# Patient Record
Sex: Female | Born: 1975
Health system: Southern US, Community
[De-identification: ages and names within clinical notes are randomized; demographics above are authoritative.]

## PROBLEM LIST (undated history)

## (undated) ENCOUNTER — Inpatient Hospital Stay (HOSPITAL_COMMUNITY): Payer: Self-pay

## (undated) DIAGNOSIS — Z8489 Family history of other specified conditions: Secondary | ICD-10-CM

## (undated) DIAGNOSIS — F329 Major depressive disorder, single episode, unspecified: Secondary | ICD-10-CM

## (undated) DIAGNOSIS — N189 Chronic kidney disease, unspecified: Secondary | ICD-10-CM

## (undated) DIAGNOSIS — J189 Pneumonia, unspecified organism: Secondary | ICD-10-CM

## (undated) DIAGNOSIS — I499 Cardiac arrhythmia, unspecified: Secondary | ICD-10-CM

## (undated) DIAGNOSIS — R112 Nausea with vomiting, unspecified: Secondary | ICD-10-CM

## (undated) DIAGNOSIS — I209 Angina pectoris, unspecified: Secondary | ICD-10-CM

## (undated) DIAGNOSIS — I10 Essential (primary) hypertension: Secondary | ICD-10-CM

## (undated) DIAGNOSIS — R569 Unspecified convulsions: Secondary | ICD-10-CM

## (undated) DIAGNOSIS — K439 Ventral hernia without obstruction or gangrene: Principal | ICD-10-CM

## (undated) DIAGNOSIS — F319 Bipolar disorder, unspecified: Secondary | ICD-10-CM

## (undated) DIAGNOSIS — C801 Malignant (primary) neoplasm, unspecified: Secondary | ICD-10-CM

## (undated) DIAGNOSIS — R0602 Shortness of breath: Secondary | ICD-10-CM

## (undated) DIAGNOSIS — E039 Hypothyroidism, unspecified: Secondary | ICD-10-CM

## (undated) DIAGNOSIS — M1990 Unspecified osteoarthritis, unspecified site: Secondary | ICD-10-CM

## (undated) DIAGNOSIS — K219 Gastro-esophageal reflux disease without esophagitis: Secondary | ICD-10-CM

## (undated) DIAGNOSIS — E119 Type 2 diabetes mellitus without complications: Secondary | ICD-10-CM

## (undated) DIAGNOSIS — D649 Anemia, unspecified: Secondary | ICD-10-CM

## (undated) DIAGNOSIS — K760 Fatty (change of) liver, not elsewhere classified: Secondary | ICD-10-CM

## (undated) DIAGNOSIS — F419 Anxiety disorder, unspecified: Secondary | ICD-10-CM

## (undated) DIAGNOSIS — L409 Psoriasis, unspecified: Secondary | ICD-10-CM

## (undated) DIAGNOSIS — G4730 Sleep apnea, unspecified: Secondary | ICD-10-CM

## (undated) DIAGNOSIS — E7800 Pure hypercholesterolemia, unspecified: Secondary | ICD-10-CM

## (undated) DIAGNOSIS — Z9889 Other specified postprocedural states: Secondary | ICD-10-CM

## (undated) DIAGNOSIS — I639 Cerebral infarction, unspecified: Secondary | ICD-10-CM

## (undated) DIAGNOSIS — F32A Depression, unspecified: Secondary | ICD-10-CM

## (undated) DIAGNOSIS — Z9289 Personal history of other medical treatment: Secondary | ICD-10-CM

## (undated) DIAGNOSIS — Z6841 Body Mass Index (BMI) 40.0 and over, adult: Secondary | ICD-10-CM

## (undated) DIAGNOSIS — F99 Mental disorder, not otherwise specified: Secondary | ICD-10-CM

## (undated) HISTORY — PX: NEUROPLASTY / TRANSPOSITION MEDIAN NERVE AT CARPAL TUNNEL BILATERAL: SUR894

## (undated) HISTORY — DX: Cerebral infarction, unspecified: I63.9

## (undated) HISTORY — PX: OTHER SURGICAL HISTORY: SHX169

## (undated) HISTORY — PX: KIDNEY STONE SURGERY: SHX686

## (undated) HISTORY — DX: Type 2 diabetes mellitus without complications: E11.9

## (undated) HISTORY — PX: APPENDECTOMY: SHX54

## (undated) HISTORY — DX: Ventral hernia without obstruction or gangrene: K43.9

## (undated) HISTORY — DX: Morbid (severe) obesity due to excess calories: E66.01

## (undated) HISTORY — PX: HERNIA REPAIR: SHX51

## (undated) HISTORY — PX: DILATION AND CURETTAGE OF UTERUS: SHX78

## (undated) HISTORY — DX: Cardiac arrhythmia, unspecified: I49.9

## (undated) HISTORY — DX: Body Mass Index (BMI) 40.0 and over, adult: Z684

## (undated) HISTORY — DX: Malignant (primary) neoplasm, unspecified: C80.1

---

## 1997-06-27 ENCOUNTER — Emergency Department (HOSPITAL_COMMUNITY): Admission: EM | Admit: 1997-06-27 | Discharge: 1997-06-27 | Payer: Self-pay

## 1997-11-30 ENCOUNTER — Ambulatory Visit (HOSPITAL_COMMUNITY): Admission: RE | Admit: 1997-11-30 | Discharge: 1997-11-30 | Payer: Self-pay | Admitting: Obstetrics and Gynecology

## 1997-12-02 ENCOUNTER — Ambulatory Visit (HOSPITAL_COMMUNITY): Admission: RE | Admit: 1997-12-02 | Discharge: 1997-12-02 | Payer: Self-pay | Admitting: *Deleted

## 1997-12-06 ENCOUNTER — Ambulatory Visit (HOSPITAL_COMMUNITY): Admission: RE | Admit: 1997-12-06 | Discharge: 1997-12-06 | Payer: Self-pay | Admitting: *Deleted

## 1998-01-14 ENCOUNTER — Encounter: Payer: Self-pay | Admitting: Obstetrics and Gynecology

## 1998-01-14 ENCOUNTER — Inpatient Hospital Stay (HOSPITAL_COMMUNITY): Admission: AD | Admit: 1998-01-14 | Discharge: 1998-01-14 | Payer: Self-pay | Admitting: Obstetrics and Gynecology

## 1998-01-16 ENCOUNTER — Ambulatory Visit (HOSPITAL_COMMUNITY): Admission: RE | Admit: 1998-01-16 | Discharge: 1998-01-16 | Payer: Self-pay | Admitting: Obstetrics and Gynecology

## 1998-01-18 ENCOUNTER — Observation Stay (HOSPITAL_COMMUNITY): Admission: RE | Admit: 1998-01-18 | Discharge: 1998-01-19 | Payer: Self-pay | Admitting: *Deleted

## 1998-01-23 ENCOUNTER — Inpatient Hospital Stay (HOSPITAL_COMMUNITY): Admission: AD | Admit: 1998-01-23 | Discharge: 1998-01-23 | Payer: Self-pay | Admitting: Obstetrics & Gynecology

## 1998-04-15 ENCOUNTER — Ambulatory Visit (HOSPITAL_COMMUNITY): Admission: RE | Admit: 1998-04-15 | Discharge: 1998-04-15 | Payer: Self-pay | Admitting: Family Medicine

## 1998-04-15 ENCOUNTER — Encounter: Payer: Self-pay | Admitting: Family Medicine

## 1998-05-03 ENCOUNTER — Emergency Department (HOSPITAL_COMMUNITY): Admission: EM | Admit: 1998-05-03 | Discharge: 1998-05-03 | Payer: Self-pay | Admitting: Emergency Medicine

## 1998-05-03 ENCOUNTER — Encounter: Payer: Self-pay | Admitting: Emergency Medicine

## 1998-09-09 ENCOUNTER — Other Ambulatory Visit: Admission: RE | Admit: 1998-09-09 | Discharge: 1998-09-09 | Payer: Self-pay | Admitting: *Deleted

## 1998-09-09 ENCOUNTER — Encounter (INDEPENDENT_AMBULATORY_CARE_PROVIDER_SITE_OTHER): Payer: Self-pay | Admitting: Specialist

## 1998-10-02 ENCOUNTER — Inpatient Hospital Stay (HOSPITAL_COMMUNITY): Admission: EM | Admit: 1998-10-02 | Discharge: 1998-10-06 | Payer: Self-pay | Admitting: Cardiology

## 1998-10-07 ENCOUNTER — Other Ambulatory Visit: Admission: RE | Admit: 1998-10-07 | Discharge: 1998-10-14 | Payer: Self-pay

## 1998-10-21 ENCOUNTER — Encounter (INDEPENDENT_AMBULATORY_CARE_PROVIDER_SITE_OTHER): Payer: Self-pay

## 1998-10-21 ENCOUNTER — Ambulatory Visit (HOSPITAL_COMMUNITY): Admission: RE | Admit: 1998-10-21 | Discharge: 1998-10-21 | Payer: Self-pay | Admitting: Obstetrics and Gynecology

## 1998-12-18 ENCOUNTER — Emergency Department (HOSPITAL_COMMUNITY): Admission: EM | Admit: 1998-12-18 | Discharge: 1998-12-18 | Payer: Self-pay | Admitting: Emergency Medicine

## 1999-03-08 ENCOUNTER — Emergency Department (HOSPITAL_COMMUNITY): Admission: EM | Admit: 1999-03-08 | Discharge: 1999-03-08 | Payer: Self-pay | Admitting: Emergency Medicine

## 1999-07-28 ENCOUNTER — Emergency Department (HOSPITAL_COMMUNITY): Admission: EM | Admit: 1999-07-28 | Discharge: 1999-07-28 | Payer: Self-pay | Admitting: Emergency Medicine

## 1999-07-28 ENCOUNTER — Encounter: Payer: Self-pay | Admitting: Emergency Medicine

## 1999-10-13 ENCOUNTER — Emergency Department (HOSPITAL_COMMUNITY): Admission: EM | Admit: 1999-10-13 | Discharge: 1999-10-13 | Payer: Self-pay | Admitting: Emergency Medicine

## 2001-08-15 ENCOUNTER — Other Ambulatory Visit: Admission: RE | Admit: 2001-08-15 | Discharge: 2001-08-15 | Payer: Self-pay | Admitting: *Deleted

## 2001-09-12 ENCOUNTER — Ambulatory Visit (HOSPITAL_BASED_OUTPATIENT_CLINIC_OR_DEPARTMENT_OTHER): Admission: RE | Admit: 2001-09-12 | Discharge: 2001-09-12 | Payer: Self-pay | Admitting: Neurology

## 2001-09-26 ENCOUNTER — Ambulatory Visit (HOSPITAL_COMMUNITY): Admission: AD | Admit: 2001-09-26 | Discharge: 2001-09-26 | Payer: Self-pay | Admitting: *Deleted

## 2001-09-26 ENCOUNTER — Encounter (INDEPENDENT_AMBULATORY_CARE_PROVIDER_SITE_OTHER): Payer: Self-pay

## 2001-10-21 ENCOUNTER — Inpatient Hospital Stay (HOSPITAL_COMMUNITY): Admission: AD | Admit: 2001-10-21 | Discharge: 2001-10-23 | Payer: Self-pay | Admitting: *Deleted

## 2001-10-21 ENCOUNTER — Encounter: Payer: Self-pay | Admitting: Emergency Medicine

## 2001-10-22 ENCOUNTER — Encounter: Payer: Self-pay | Admitting: *Deleted

## 2001-12-16 ENCOUNTER — Ambulatory Visit (HOSPITAL_COMMUNITY): Admission: RE | Admit: 2001-12-16 | Discharge: 2001-12-16 | Payer: Self-pay | Admitting: Endocrinology

## 2001-12-16 ENCOUNTER — Encounter: Payer: Self-pay | Admitting: Endocrinology

## 2001-12-22 ENCOUNTER — Ambulatory Visit (HOSPITAL_BASED_OUTPATIENT_CLINIC_OR_DEPARTMENT_OTHER): Admission: RE | Admit: 2001-12-22 | Discharge: 2001-12-22 | Payer: Self-pay | Admitting: Specialist

## 2001-12-22 ENCOUNTER — Encounter (INDEPENDENT_AMBULATORY_CARE_PROVIDER_SITE_OTHER): Payer: Self-pay | Admitting: *Deleted

## 2002-01-19 ENCOUNTER — Emergency Department (HOSPITAL_COMMUNITY): Admission: EM | Admit: 2002-01-19 | Discharge: 2002-01-20 | Payer: Self-pay | Admitting: Emergency Medicine

## 2002-01-19 ENCOUNTER — Encounter: Payer: Self-pay | Admitting: Emergency Medicine

## 2002-01-28 ENCOUNTER — Ambulatory Visit (HOSPITAL_BASED_OUTPATIENT_CLINIC_OR_DEPARTMENT_OTHER): Admission: RE | Admit: 2002-01-28 | Discharge: 2002-01-28 | Payer: Self-pay | Admitting: Neurology

## 2002-09-03 ENCOUNTER — Encounter: Admission: RE | Admit: 2002-09-03 | Discharge: 2002-12-02 | Payer: Self-pay | Admitting: Orthopedic Surgery

## 2002-10-06 ENCOUNTER — Emergency Department (HOSPITAL_COMMUNITY): Admission: EM | Admit: 2002-10-06 | Discharge: 2002-10-06 | Payer: Self-pay | Admitting: Emergency Medicine

## 2002-10-22 ENCOUNTER — Other Ambulatory Visit: Admission: RE | Admit: 2002-10-22 | Discharge: 2002-10-22 | Payer: Self-pay | Admitting: *Deleted

## 2003-06-03 ENCOUNTER — Emergency Department (HOSPITAL_COMMUNITY): Admission: EM | Admit: 2003-06-03 | Discharge: 2003-06-03 | Payer: Self-pay | Admitting: Family Medicine

## 2003-06-14 ENCOUNTER — Inpatient Hospital Stay (HOSPITAL_COMMUNITY): Admission: AD | Admit: 2003-06-14 | Discharge: 2003-06-14 | Payer: Self-pay | Admitting: Obstetrics & Gynecology

## 2003-06-20 ENCOUNTER — Emergency Department (HOSPITAL_COMMUNITY): Admission: EM | Admit: 2003-06-20 | Discharge: 2003-06-20 | Payer: Self-pay | Admitting: Family Medicine

## 2003-07-21 ENCOUNTER — Emergency Department (HOSPITAL_COMMUNITY): Admission: EM | Admit: 2003-07-21 | Discharge: 2003-07-21 | Payer: Self-pay | Admitting: Family Medicine

## 2003-08-16 ENCOUNTER — Emergency Department (HOSPITAL_COMMUNITY): Admission: EM | Admit: 2003-08-16 | Discharge: 2003-08-16 | Payer: Self-pay | Admitting: Family Medicine

## 2003-08-23 ENCOUNTER — Emergency Department (HOSPITAL_COMMUNITY): Admission: EM | Admit: 2003-08-23 | Discharge: 2003-08-23 | Payer: Self-pay | Admitting: Family Medicine

## 2003-11-03 ENCOUNTER — Emergency Department (HOSPITAL_COMMUNITY): Admission: EM | Admit: 2003-11-03 | Discharge: 2003-11-03 | Payer: Self-pay | Admitting: Family Medicine

## 2003-12-06 ENCOUNTER — Encounter (INDEPENDENT_AMBULATORY_CARE_PROVIDER_SITE_OTHER): Payer: Self-pay | Admitting: *Deleted

## 2003-12-06 ENCOUNTER — Ambulatory Visit (HOSPITAL_COMMUNITY): Admission: RE | Admit: 2003-12-06 | Discharge: 2003-12-06 | Payer: Self-pay | Admitting: *Deleted

## 2003-12-21 ENCOUNTER — Encounter: Admission: RE | Admit: 2003-12-21 | Discharge: 2003-12-21 | Payer: Self-pay | Admitting: General Surgery

## 2004-01-07 ENCOUNTER — Emergency Department (HOSPITAL_COMMUNITY): Admission: EM | Admit: 2004-01-07 | Discharge: 2004-01-07 | Payer: Self-pay | Admitting: Family Medicine

## 2004-03-08 ENCOUNTER — Inpatient Hospital Stay (HOSPITAL_COMMUNITY): Admission: RE | Admit: 2004-03-08 | Discharge: 2004-03-13 | Payer: Self-pay | Admitting: Psychiatry

## 2004-03-08 ENCOUNTER — Ambulatory Visit: Payer: Self-pay | Admitting: Psychiatry

## 2004-03-14 ENCOUNTER — Other Ambulatory Visit (HOSPITAL_COMMUNITY): Admission: RE | Admit: 2004-03-14 | Discharge: 2004-06-12 | Payer: Self-pay | Admitting: Psychiatry

## 2004-04-12 ENCOUNTER — Ambulatory Visit: Payer: Self-pay | Admitting: Licensed Clinical Social Worker

## 2004-04-19 ENCOUNTER — Ambulatory Visit: Payer: Self-pay | Admitting: Licensed Clinical Social Worker

## 2004-05-03 ENCOUNTER — Ambulatory Visit: Payer: Self-pay | Admitting: Licensed Clinical Social Worker

## 2004-08-15 ENCOUNTER — Ambulatory Visit (HOSPITAL_COMMUNITY): Admission: RE | Admit: 2004-08-15 | Discharge: 2004-08-15 | Payer: Self-pay | Admitting: Obstetrics & Gynecology

## 2004-09-18 ENCOUNTER — Ambulatory Visit (HOSPITAL_COMMUNITY): Admission: RE | Admit: 2004-09-18 | Discharge: 2004-09-18 | Payer: Self-pay | Admitting: Obstetrics

## 2004-10-25 ENCOUNTER — Ambulatory Visit (HOSPITAL_COMMUNITY): Admission: RE | Admit: 2004-10-25 | Discharge: 2004-10-25 | Payer: Self-pay | Admitting: Obstetrics & Gynecology

## 2004-12-25 ENCOUNTER — Inpatient Hospital Stay (HOSPITAL_COMMUNITY): Admission: AD | Admit: 2004-12-25 | Discharge: 2004-12-28 | Payer: Self-pay | Admitting: Obstetrics

## 2005-01-22 DIAGNOSIS — I639 Cerebral infarction, unspecified: Secondary | ICD-10-CM

## 2005-01-22 HISTORY — DX: Cerebral infarction, unspecified: I63.9

## 2005-02-15 ENCOUNTER — Ambulatory Visit (HOSPITAL_COMMUNITY): Admission: RE | Admit: 2005-02-15 | Discharge: 2005-02-15 | Payer: Self-pay | Admitting: Obstetrics & Gynecology

## 2005-02-26 ENCOUNTER — Ambulatory Visit (HOSPITAL_COMMUNITY): Admission: RE | Admit: 2005-02-26 | Discharge: 2005-02-26 | Payer: Self-pay | Admitting: Obstetrics & Gynecology

## 2005-04-04 ENCOUNTER — Inpatient Hospital Stay (HOSPITAL_COMMUNITY): Admission: AD | Admit: 2005-04-04 | Discharge: 2005-04-06 | Payer: Self-pay | Admitting: Obstetrics

## 2005-06-05 ENCOUNTER — Emergency Department (HOSPITAL_COMMUNITY): Admission: EM | Admit: 2005-06-05 | Discharge: 2005-06-05 | Payer: Self-pay | Admitting: Emergency Medicine

## 2005-10-16 ENCOUNTER — Ambulatory Visit (HOSPITAL_COMMUNITY): Admission: RE | Admit: 2005-10-16 | Discharge: 2005-10-16 | Payer: Self-pay | Admitting: *Deleted

## 2005-10-16 ENCOUNTER — Encounter (INDEPENDENT_AMBULATORY_CARE_PROVIDER_SITE_OTHER): Payer: Self-pay | Admitting: *Deleted

## 2006-02-21 ENCOUNTER — Ambulatory Visit (HOSPITAL_BASED_OUTPATIENT_CLINIC_OR_DEPARTMENT_OTHER): Admission: RE | Admit: 2006-02-21 | Discharge: 2006-02-21 | Payer: Self-pay | Admitting: Neurology

## 2006-02-24 ENCOUNTER — Ambulatory Visit: Payer: Self-pay | Admitting: Internal Medicine

## 2006-11-03 ENCOUNTER — Emergency Department (HOSPITAL_COMMUNITY): Admission: EM | Admit: 2006-11-03 | Discharge: 2006-11-03 | Payer: Self-pay | Admitting: Family Medicine

## 2006-12-02 ENCOUNTER — Emergency Department (HOSPITAL_COMMUNITY): Admission: EM | Admit: 2006-12-02 | Discharge: 2006-12-03 | Payer: Self-pay | Admitting: Emergency Medicine

## 2006-12-04 ENCOUNTER — Inpatient Hospital Stay (HOSPITAL_COMMUNITY): Admission: EM | Admit: 2006-12-04 | Discharge: 2006-12-09 | Payer: Self-pay | Admitting: Emergency Medicine

## 2007-04-25 ENCOUNTER — Ambulatory Visit (HOSPITAL_COMMUNITY): Admission: RE | Admit: 2007-04-25 | Discharge: 2007-04-25 | Payer: Self-pay | Admitting: Obstetrics & Gynecology

## 2007-05-23 ENCOUNTER — Emergency Department (HOSPITAL_COMMUNITY): Admission: EM | Admit: 2007-05-23 | Discharge: 2007-05-24 | Payer: Self-pay | Admitting: Emergency Medicine

## 2007-05-25 ENCOUNTER — Emergency Department (HOSPITAL_COMMUNITY): Admission: EM | Admit: 2007-05-25 | Discharge: 2007-05-25 | Payer: Self-pay | Admitting: Family Medicine

## 2007-07-21 ENCOUNTER — Encounter: Payer: Self-pay | Admitting: Obstetrics & Gynecology

## 2007-07-21 ENCOUNTER — Ambulatory Visit (HOSPITAL_COMMUNITY): Admission: RE | Admit: 2007-07-21 | Discharge: 2007-07-21 | Payer: Self-pay | Admitting: Obstetrics & Gynecology

## 2007-10-08 ENCOUNTER — Encounter: Admission: RE | Admit: 2007-10-08 | Discharge: 2007-11-11 | Payer: Self-pay | Admitting: Internal Medicine

## 2007-11-06 ENCOUNTER — Emergency Department (HOSPITAL_BASED_OUTPATIENT_CLINIC_OR_DEPARTMENT_OTHER): Admission: EM | Admit: 2007-11-06 | Discharge: 2007-11-06 | Payer: Self-pay | Admitting: Emergency Medicine

## 2007-11-07 ENCOUNTER — Emergency Department (HOSPITAL_BASED_OUTPATIENT_CLINIC_OR_DEPARTMENT_OTHER): Admission: EM | Admit: 2007-11-07 | Discharge: 2007-11-07 | Payer: Self-pay | Admitting: Emergency Medicine

## 2008-07-16 ENCOUNTER — Emergency Department (HOSPITAL_COMMUNITY): Admission: EM | Admit: 2008-07-16 | Discharge: 2008-07-16 | Payer: Self-pay | Admitting: Emergency Medicine

## 2008-11-22 ENCOUNTER — Ambulatory Visit (HOSPITAL_COMMUNITY): Admission: RE | Admit: 2008-11-22 | Discharge: 2008-11-22 | Payer: Self-pay | Admitting: Obstetrics & Gynecology

## 2008-12-01 DIAGNOSIS — I10 Essential (primary) hypertension: Secondary | ICD-10-CM | POA: Insufficient documentation

## 2008-12-01 DIAGNOSIS — E282 Polycystic ovarian syndrome: Secondary | ICD-10-CM | POA: Insufficient documentation

## 2008-12-01 DIAGNOSIS — G43909 Migraine, unspecified, not intractable, without status migrainosus: Secondary | ICD-10-CM | POA: Insufficient documentation

## 2008-12-01 DIAGNOSIS — F3189 Other bipolar disorder: Secondary | ICD-10-CM | POA: Insufficient documentation

## 2008-12-01 DIAGNOSIS — E039 Hypothyroidism, unspecified: Secondary | ICD-10-CM | POA: Insufficient documentation

## 2008-12-07 ENCOUNTER — Ambulatory Visit: Payer: Self-pay | Admitting: Gastroenterology

## 2008-12-07 DIAGNOSIS — O0001 Abdominal pregnancy with intrauterine pregnancy: Secondary | ICD-10-CM | POA: Insufficient documentation

## 2008-12-07 DIAGNOSIS — N870 Mild cervical dysplasia: Secondary | ICD-10-CM

## 2008-12-07 DIAGNOSIS — F329 Major depressive disorder, single episode, unspecified: Secondary | ICD-10-CM

## 2008-12-07 DIAGNOSIS — N390 Urinary tract infection, site not specified: Secondary | ICD-10-CM

## 2008-12-07 DIAGNOSIS — G4730 Sleep apnea, unspecified: Secondary | ICD-10-CM | POA: Insufficient documentation

## 2008-12-07 DIAGNOSIS — R197 Diarrhea, unspecified: Secondary | ICD-10-CM

## 2008-12-07 DIAGNOSIS — K649 Unspecified hemorrhoids: Secondary | ICD-10-CM | POA: Insufficient documentation

## 2008-12-07 DIAGNOSIS — R112 Nausea with vomiting, unspecified: Secondary | ICD-10-CM

## 2008-12-07 DIAGNOSIS — R51 Headache: Secondary | ICD-10-CM

## 2008-12-07 DIAGNOSIS — R519 Headache, unspecified: Secondary | ICD-10-CM | POA: Insufficient documentation

## 2008-12-07 DIAGNOSIS — K625 Hemorrhage of anus and rectum: Secondary | ICD-10-CM | POA: Insufficient documentation

## 2008-12-07 DIAGNOSIS — R1013 Epigastric pain: Secondary | ICD-10-CM

## 2008-12-07 DIAGNOSIS — Z87442 Personal history of urinary calculi: Secondary | ICD-10-CM | POA: Insufficient documentation

## 2008-12-07 DIAGNOSIS — T887XXA Unspecified adverse effect of drug or medicament, initial encounter: Secondary | ICD-10-CM

## 2008-12-12 ENCOUNTER — Emergency Department (HOSPITAL_COMMUNITY): Admission: EM | Admit: 2008-12-12 | Discharge: 2008-12-12 | Payer: Self-pay | Admitting: Emergency Medicine

## 2008-12-14 ENCOUNTER — Ambulatory Visit (HOSPITAL_COMMUNITY): Admission: RE | Admit: 2008-12-14 | Discharge: 2008-12-14 | Payer: Self-pay | Admitting: Gastroenterology

## 2008-12-15 ENCOUNTER — Inpatient Hospital Stay (HOSPITAL_COMMUNITY): Admission: AD | Admit: 2008-12-15 | Discharge: 2008-12-20 | Payer: Self-pay | Admitting: Obstetrics

## 2008-12-21 ENCOUNTER — Ambulatory Visit (HOSPITAL_COMMUNITY): Admission: RE | Admit: 2008-12-21 | Discharge: 2008-12-21 | Payer: Self-pay | Admitting: Obstetrics & Gynecology

## 2008-12-22 ENCOUNTER — Telehealth: Payer: Self-pay | Admitting: Gastroenterology

## 2009-01-22 DIAGNOSIS — I209 Angina pectoris, unspecified: Secondary | ICD-10-CM

## 2009-01-22 HISTORY — DX: Angina pectoris, unspecified: I20.9

## 2009-02-09 DIAGNOSIS — A4902 Methicillin resistant Staphylococcus aureus infection, unspecified site: Secondary | ICD-10-CM | POA: Insufficient documentation

## 2009-02-10 ENCOUNTER — Inpatient Hospital Stay (HOSPITAL_COMMUNITY): Admission: AD | Admit: 2009-02-10 | Discharge: 2009-02-14 | Payer: Self-pay | Admitting: Obstetrics & Gynecology

## 2009-02-10 ENCOUNTER — Encounter: Payer: Self-pay | Admitting: Obstetrics & Gynecology

## 2009-03-04 ENCOUNTER — Encounter (INDEPENDENT_AMBULATORY_CARE_PROVIDER_SITE_OTHER): Payer: Self-pay | Admitting: *Deleted

## 2009-03-07 ENCOUNTER — Ambulatory Visit: Payer: Self-pay | Admitting: Infectious Diseases

## 2009-03-24 ENCOUNTER — Ambulatory Visit (HOSPITAL_COMMUNITY): Admission: RE | Admit: 2009-03-24 | Discharge: 2009-03-24 | Payer: Self-pay | Admitting: Obstetrics & Gynecology

## 2009-04-18 ENCOUNTER — Ambulatory Visit (HOSPITAL_COMMUNITY): Admission: RE | Admit: 2009-04-18 | Discharge: 2009-04-18 | Payer: Self-pay | Admitting: Obstetrics & Gynecology

## 2009-05-16 ENCOUNTER — Ambulatory Visit (HOSPITAL_COMMUNITY): Admission: RE | Admit: 2009-05-16 | Discharge: 2009-05-16 | Payer: Self-pay | Admitting: Obstetrics & Gynecology

## 2009-06-14 ENCOUNTER — Ambulatory Visit (HOSPITAL_COMMUNITY): Admission: RE | Admit: 2009-06-14 | Discharge: 2009-06-14 | Payer: Self-pay | Admitting: Obstetrics & Gynecology

## 2009-06-14 ENCOUNTER — Inpatient Hospital Stay (HOSPITAL_COMMUNITY): Admission: AD | Admit: 2009-06-14 | Discharge: 2009-06-14 | Payer: Self-pay | Admitting: Obstetrics & Gynecology

## 2009-06-15 ENCOUNTER — Inpatient Hospital Stay (HOSPITAL_COMMUNITY): Admission: AD | Admit: 2009-06-15 | Discharge: 2009-06-20 | Payer: Self-pay | Admitting: Obstetrics

## 2009-06-15 ENCOUNTER — Encounter: Payer: Self-pay | Admitting: Obstetrics

## 2009-06-17 ENCOUNTER — Encounter: Payer: Self-pay | Admitting: Obstetrics

## 2009-07-14 ENCOUNTER — Encounter: Payer: Self-pay | Admitting: Gastroenterology

## 2009-07-22 ENCOUNTER — Ambulatory Visit: Payer: Self-pay | Admitting: Gastroenterology

## 2009-07-22 LAB — CONVERTED CEMR LAB
ALT: 44 units/L — ABNORMAL HIGH (ref 0–35)
Alkaline Phosphatase: 80 units/L (ref 39–117)
Amylase: 33 units/L (ref 27–131)
Bilirubin, Direct: 0.1 mg/dL (ref 0.0–0.3)
IgA: 225 mg/dL (ref 68–378)
Lipase: 43 units/L (ref 11.0–59.0)
Saturation Ratios: 13.6 % — ABNORMAL LOW (ref 20.0–50.0)
Sed Rate: 14 mm/hr (ref 0–22)
Total Protein: 6.9 g/dL (ref 6.0–8.3)

## 2009-08-04 ENCOUNTER — Telehealth: Payer: Self-pay | Admitting: Gastroenterology

## 2009-08-05 ENCOUNTER — Ambulatory Visit: Payer: Self-pay | Admitting: Gastroenterology

## 2009-08-05 DIAGNOSIS — F319 Bipolar disorder, unspecified: Secondary | ICD-10-CM | POA: Insufficient documentation

## 2009-08-05 DIAGNOSIS — K3184 Gastroparesis: Secondary | ICD-10-CM | POA: Insufficient documentation

## 2009-08-08 ENCOUNTER — Telehealth: Payer: Self-pay | Admitting: Gastroenterology

## 2009-08-10 ENCOUNTER — Encounter: Payer: Self-pay | Admitting: Gastroenterology

## 2009-08-25 ENCOUNTER — Ambulatory Visit (HOSPITAL_COMMUNITY): Admission: RE | Admit: 2009-08-25 | Discharge: 2009-08-25 | Payer: Self-pay | Admitting: Gastroenterology

## 2009-09-02 ENCOUNTER — Emergency Department (HOSPITAL_COMMUNITY)
Admission: EM | Admit: 2009-09-02 | Discharge: 2009-09-03 | Disposition: A | Discharge status: Other Acute Inpatient Hospital | Payer: Self-pay | Source: Home / Self Care | Admitting: Emergency Medicine

## 2009-09-03 ENCOUNTER — Inpatient Hospital Stay (HOSPITAL_COMMUNITY): Admission: AD | Admit: 2009-09-03 | Discharge: 2009-09-09 | Payer: Self-pay | Admitting: Psychiatry

## 2009-09-03 ENCOUNTER — Ambulatory Visit: Payer: Self-pay | Admitting: Psychiatry

## 2009-12-13 ENCOUNTER — Emergency Department (HOSPITAL_COMMUNITY): Admission: EM | Admit: 2009-12-13 | Discharge: 2009-12-13 | Payer: Self-pay | Admitting: Family Medicine

## 2010-02-12 ENCOUNTER — Encounter: Payer: Self-pay | Admitting: Gastroenterology

## 2010-02-21 NOTE — Miscellaneous (Signed)
Summary: clotest  Clinical Lists Changes  Problems: Added new problem of GASTROPARESIS (ICD-536.3) Orders: Added new Test order of TLB-H Pylori Screen Gastric Biopsy (83013-CLOTEST) - Signed

## 2010-02-21 NOTE — Procedures (Signed)
Summary: Upper Endoscopy  Patient: [name] Note: All result statuses are Final unless otherwise noted.  Tests: (1) Upper Endoscopy (EGD)   EGD Upper Endoscopy       DONE     [facility]     [address]     [place], [place]  [address]           ENDOSCOPY PROCEDURE REPORT           PATIENT:  Amanda, [name]  MR#:  [id number]     BIRTHDATE:  16-Aug-1975, 34 yrs. old  GENDER:  female           ENDOSCOPIST:  Vania Rea. Jarold Motto, MD, [facility]     Referred by:  Juline Patch, M.D.           PROCEDURE DATE:  08/05/2009     PROCEDURE:  EGD with biopsy     ASA CLASS:  Class II     INDICATIONS:  heartburn, nausea and vomiting           MEDICATIONS:   There was residual sedation effect present from     prior procedure., Fentanyl 25 mcg, Versed 3 mg IV     TOPICAL ANESTHETIC:  Exactacain Spray           DESCRIPTION OF PROCEDURE:   After the risks benefits and     alternatives of the procedure were thoroughly explained, informed     consent was obtained.  The LB [id number]-H180 [id number] endoscope was     introduced through the mouth and advanced to the second portion of     the duodenum, limited by retching and gagging.   The instrument     was slowly withdrawn as the mucosa was fully examined.     <<PROCEDUREIMAGES>>           The upper, middle, and distal third of the esophagus were     carefully inspected and no abnormalities were noted. The z-line     was well seen at the GEJ. The endoscope was pushed into the fundus     which was normal including a retroflexed view. The antrum,gastric     body, first and second part of the duodenum were unremarkable.     LARGE VOLUME OF FLUID IN STOMACH ASPIRATED.CLO BX. DONE.     Retroflexed views revealed no masses.    The scope was then withdrawn     from the patient and the procedure completed.           COMPLICATIONS:  None           ENDOSCOPIC IMPRESSION:     1) Normal EGD     2) No masses     ?? GASTROPARESIS.  RECOMMENDATIONS:     1) Await pathology results     2) Rx CLO if positive     3) continue PPI     GASTRIC EMPTYING SCAN.           REPEAT EXAM:  No           ______________________________     Vania Rea. Jarold Motto, MD, [facility]           CC:           n.     eSIGNED:   Vania Rea. Patterson at 08/05/2009 02:41 PM           Lucrezia Europe, [id number]  Note: An exclamation mark (!) indicates a result that was not dispersed into  the flowsheet. Document Creation Date: 08/05/2009 2:41 PM _______________________________________________________________________  (1) Order result status: Final Collection or observation date-time: 08/05/2009 14:35 Requested date-time:  Receipt date-time:  Reported date-time:  Referring Physician:   Ordering Physician: Sheryn Bison [phone]) Specimen Source:  Source: Launa Grill Order Number: [id number] Lab site:   Appended Document: Upper Endoscopy    Clinical Lists Changes  Orders: Added new Test order of Gastric Emptying Scan (GES) - Signed

## 2010-02-21 NOTE — Miscellaneous (Signed)
Summary: Problems and Medications updated  Clinical Lists Changes  Problems: Added new problem of MRSA (ICD-041.12) - Recurrent Community Acquired Cutaneious Medications: Added new medication of VALACYCLOVIR HCL 500 MG TABS (VALACYCLOVIR HCL) Take 1 tablet by mouth once a day per referring MD Added new medication of FEMARA 2.5 MG TABS (LETROZOLE) per referring MD Added new medication of BACTROBAN NASAL 2 % OINT (MUPIROCIN CALCIUM) per referring MD

## 2010-02-21 NOTE — Letter (Signed)
Summary: Patient Notice- Polyp Results  [facility]  [address] [place], [place] [address]   Phone: [phone]  Fax: [phone]        August 10, 2009 MRN: [id number]    [place] [address] [place], [place]  [address]    Dear Ms. PREUSS,  I am pleased to inform you that the colon polyp(s) removed during your recent colonoscopy was (were) found to be benign (no cancer detected) upon pathologic examination.  I recommend you have a repeat colonoscopy examination in 5_ years to look for recurrent polyps, as having colon polyps increases your risk for having recurrent polyps or even colon cancer in the future.  Should you develop new or worsening symptoms of abdominal pain, bowel habit changes or bleeding from the rectum or bowels, please schedule an evaluation with either your primary care physician or with me.  Additional information/recommendations:  _X_ No further action with gastroenterology is needed at this time. Please      follow-up with your primary care physician for your other healthcare      needs.  __ Please call [phone] to schedule a return visit to review your      situation.  __ Please keep your follow-up visit as already scheduled.  __ Continue treatment plan as outlined the day of your exam.  Please call us if you are having persistent problems or have questions about your condition that have not been fully answered at this time.  Sincerely,  Mardella Layman MD [facility]  This letter has been electronically signed by your physician.  Appended Document: Patient Notice- Polyp Results letter mailed.

## 2010-02-21 NOTE — Letter (Signed)
Summary: Patient [facility] Biopsy Results  [facility]  [address] [place], [place] [address]   Phone: [phone]  Fax: [phone]        August 10, 2009 MRN: [id number]    [facility] [address] [place], [place]  [address]    Dear Ms. PREUSS,  I am pleased to inform you that the biopsies taken during your recent endoscopic examination did not show any evidence of cancer upon pathologic examination.BIOPSY FOR HELICOBACTER INFECTION WAS NEGATIVE.  Additional information/recommendations:  __No further action is needed at this time.  Please follow-up with      your primary care physician for your other healthcare needs.  __ Please call [phone] to schedule a return visit to review      your condition.  _X_ Continue with the treatment plan as outlined on the day of your      exam.  __ You should have a repeat endoscopic examination for this problem              in _ months/years.   Please call us if you are having persistent problems or have questions about your condition that have not been fully answered at this time.  Sincerely,  Mardella Layman MD [facility]  This letter has been electronically signed by your physician.  Appended Document: Patient Notice-Endo Biopsy Results letter mailed.

## 2010-02-21 NOTE — Progress Notes (Signed)
Summary: phone note  Phone Note Call from Patient   Caller: Patient Summary of Call: Pt called, needing clarification on Moviprep instructions.  All questions answered and pt has no more at this time.  Initial call taken by: Karl Bales RN,  August 04, 2009 2:34 PM

## 2010-02-21 NOTE — Miscellaneous (Signed)
Summary: Aciphex Denial   Clinical Lists Changes  Medications: Changed medication from ACIPHEX 20 MG TBEC (RABEPRAZOLE SODIUM) one tablet by mouth once daily to NEXIUM 40 MG CPDR (ESOMEPRAZOLE MAGNESIUM) one tablet by mouth once daily - Signed Rx of NEXIUM 40 MG CPDR (ESOMEPRAZOLE MAGNESIUM) one tablet by mouth once daily;  #30 x 11;  Signed;  Entered by: Ok Anis CMA;  Authorized by: Mardella Layman MD [facility];  Method used: Electronically to [facility] #[id number]*, [address], [place], [place], [place]  [address], Ph: [phone], Fax: [phone]  Patient has Medicaid per her pharmacy and Medicaid will not approve Aciphex had to change to Nexium   Prescriptions: NEXIUM 40 MG CPDR (ESOMEPRAZOLE MAGNESIUM) one tablet by mouth once daily  #30 x 11   Entered by:   Ok Anis CMA   Authorized by:   Mardella Layman MD [facility]   Signed by:   Ok Anis CMA on 07/22/2009   Method used:   Electronically to        [facility] [phone]* (retail)       [address]       [place], [place]  [address]       Ph: [phone]       Fax: [phone]   RxID:   [id number]

## 2010-02-21 NOTE — Letter (Signed)
Summary: [facility] Instructions  [facility]  [address] [place], [place] [address]   Phone: [phone]  Fax: [phone]       SHERRELLE PROCHAZKA    14-Oct-1975    MRN: [id number]        Procedure Day /Date: Friday July 15th, 2011     Arrival Time: 1:00pm     Procedure Time: 2:00pm     Location of Procedure:                    _ x_  [facility] (4th Floor)                        PREPARATION FOR COLONOSCOPY WITH MOVIPREP   Starting 5 days prior to your procedure 07/31/09 do not eat nuts, seeds, popcorn, corn, beans, peas,  salads, or any raw vegetables.  Do not take any fiber supplements (e.g. Metamucil, Citrucel, and Benefiber).  THE DAY BEFORE YOUR PROCEDURE         DATE: 08/04/09  DAY: Thursday  1.  Drink clear liquids the entire day-NO SOLID FOOD  2.  Do not drink anything colored red or purple.  Avoid juices with pulp.  No orange juice.  3.  Drink at least 64 oz. (8 glasses) of fluid/clear liquids during the day to prevent dehydration and help the prep work efficiently.  CLEAR LIQUIDS INCLUDE: Water Jello Ice Popsicles Tea (sugar ok, no milk/cream) Powdered fruit flavored drinks Coffee (sugar ok, no milk/cream) Gatorade Juice: apple, white grape, white cranberry  Lemonade Clear bullion, consomm, broth Carbonated beverages (any kind) Strained chicken noodle soup Hard Candy                             4.  In the morning, mix first dose of MoviPrep solution:    Empty 1 Pouch A and 1 Pouch B into the disposable container    Add lukewarm drinking water to the top line of the container. Mix to dissolve    Refrigerate (mixed solution should be used within 24 hrs)  5.  Begin drinking the prep at 5:00 p.m. The MoviPrep container is divided by 4 marks.   Every 15 minutes drink the solution down to the next mark (approximately 8 oz) until the full liter is complete.   6.  Follow completed prep with 16 oz of clear liquid of your choice  (Nothing red or purple).  Continue to drink clear liquids until bedtime.  7.  Before going to bed, mix second dose of MoviPrep solution:    Empty 1 Pouch A and 1 Pouch B into the disposable container    Add lukewarm drinking water to the top line of the container. Mix to dissolve    Refrigerate  THE DAY OF YOUR PROCEDURE      DATE: 08/05/09 DAY: Friday  Beginning at 9:00 a.m. (5 hours before procedure):         1. Every 15 minutes, drink the solution down to the next mark (approx 8 oz) until the full liter is complete.  2. Follow completed prep with 16 oz. of clear liquid of your choice.    3. You may drink clear liquids until 12:00noon (2 HOURS BEFORE PROCEDURE).   MEDICATION INSTRUCTIONS  Unless otherwise instructed, you should take regular prescription medications with a small sip of water   as early as possible the morning of your  procedure.        OTHER INSTRUCTIONS  You will need a responsible adult at least 36 years of age to accompany you and drive you home.   This person must remain in the waiting room during your procedure.  Wear loose fitting clothing that is easily removed.  Leave jewelry and other valuables at home.  However, you may wish to bring a book to read or  an iPod/MP3 player to listen to music as you wait for your procedure to start.  Remove all body piercing jewelry and leave at home.  Total time from sign-in until discharge is approximately 2-3 hours.  You should go home directly after your procedure and rest.  You can resume normal activities the  day after your procedure.  The day of your procedure you should not:   Drive   Make legal decisions   Operate machinery   Drink alcohol   Return to work  You will receive specific instructions about eating, activities and medications before you leave.    The above instructions have been reviewed and explained to me by   Marchelle Folks.     I fully understand and can verbalize these  instructions _____________________________ Date _________

## 2010-02-21 NOTE — Assessment & Plan Note (Signed)
Summary: Pregnant, Community Acquired MRSA INfection/KDW   Referring Provider:  Juline Patch, MD  Primary Provider:  Juline Patch, MD   CC:  Pregnant and Community acquired MRSA infection.  History of Present Illness: 35 yo F G5P2  at 22 weeks, comes to clinic today with feeling horrible. 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 on tamiflu, z-pack, robitussin AC.   On Jan 19 and 21st had I & D of MRSA boil on L flank. Was adm to the hospital and treated with vanco for 5 days.    Had lesion on R calf 5 years ago that was thought to be a "spider bite". TOok 6 months to heal. Prev had lesion on R flank several years ago and was given IV vanco in ED. Sept 2010 had lesion above prev on R flank and was treated with vanco in the hospital.  Has had lesion on her toe as well, lanced and treaed with avelox.  She and her household contacts have used [place] for 5 days on Jan 19th. uses dail antibacterial soap and 3x/week uses hibiclens.    Preventive Screening-Counseling & Management  Alcohol-Tobacco     Alcohol drinks/day: 0     Smoking Status: never  Caffeine-Diet-Exercise     Caffeine use/day: sodas     Does Patient Exercise: no  Safety-Violence-Falls     Seat Belt Use: yes   Updated Prior Medication List: SYNTHROID 112 MCG TABS (LEVOTHYROXINE SODIUM) one tablet by mouth once daily ATENOLOL 50 MG TABS (ATENOLOL) one tablet by mouth once daily PRENATE ESSENTIAL 28-0.6-0.4-340 MG CAPS (PRENAT W/O A-FE-METHF-FA-OMEGA) once daily VALACYCLOVIR HCL 500 MG TABS (VALACYCLOVIR HCL) Take 1 tablet by mouth once a day per referring MD BACTROBAN NASAL 2 % OINT (MUPIROCIN CALCIUM) per referring MD PROMETHAZINE HCL 25 MG TABS (PROMETHAZINE HCL) four times a day as needed nausea ZOFRAN ODT 4 MG TBDP (ONDANSETRON) three times a day as needed nausea TAMIFLU 75 MG CAPS (OSELTAMIVIR PHOSPHATE) once daily ZITHROMAX 1 GM PACK (AZITHROMYCIN)   Current Allergies (reviewed  today): ! KEFLEX Past History:  Past Medical History: Current Problems:  SLEEP APNEA (ICD-780.57) UTI (ICD-599.0) NAUSEA AND VOMITING (ICD-787.01) HEMORRHOIDS (ICD-455.6) RECTAL BLEEDING (ICD-569.3) ADENOCARCINOMA, COLON, FAMILY HX (ICD-V16.0) CERVICAL CANCER (ICD-180.9) HEADACHE, CHRONIC (ICD-784.0) DIARRHEA (ICD-787.91) DEPRESSION (ICD-311) POLYCYSTIC OVARIES (ICD-256.4) OBESITY, MORBID (ICD-278.01) MIGRAINE HEADACHE (ICD-346.90) HYPOTHYROIDISM (ICD-244.9) BIPOLAR II DISORDER (ICD-296.89) HYPERTENSION (ICD-401.9) MRSA Boils  Current Medications (verified): 1)  Synthroid 112 Mcg Tabs (Levothyroxine Sodium) .... One Tablet By Mouth Once Daily 2)  Atenolol 50 Mg Tabs (Atenolol) .... One Tablet By Mouth Once Daily 3)  Prenate Essential 28-0.6-0.4-340 Mg Caps (Prenat W/o A-Fe-Methf-Fa-Omega) .... Once Daily 4)  Valacyclovir Hcl 500 Mg Tabs (Valacyclovir Hcl) .... Take 1 Tablet By Mouth Once A Day Per Referring Md 5)  Bactroban Nasal 2 % Oint (Mupirocin Calcium) .... Per Referring Md 6)  Promethazine Hcl 25 Mg Tabs (Promethazine Hcl) .... Four Times A Day As Needed Nausea 7)  Zofran Odt 4 Mg Tbdp (Ondansetron) .... Three Times A Day As Needed Nausea 8)  Tamiflu 75 Mg Caps (Oseltamivir Phosphate) .... Once Daily 9)  Zithromax 1 Gm Pack (Azithromycin)  Allergies (verified): 1)  ! Keflex   Review of Systems       eating well with exception of nausea, constipation,   Vital Signs:  Patient profile:   35 year old female Height:      66 inches (167.64 cm) Weight:  299.2 pounds (136.00 kg) BMI:     48.47 Temp:     97.2 degrees F (36.22 degrees C) oral Pulse rate:   76 / minute BP sitting:   130 / 77  (right arm)  Vitals Entered By: Baxter Hire) (March 07, 2009 9:22 AM) CC: Pregnant, Community acquired MRSA infection Pain Assessment Patient in pain? no      Nutritional Status BMI of > 30 = obese Nutritional Status Detail appetite is horrible per  patient  Does patient need assistance? Functional Status Self care Ambulation Normal c  Physical Exam  General:  well-developed, well-nourished, well-hydrated, and overweight-appearing.   Eyes:  pupils equal, pupils round, and pupils reactive to light.   Mouth:  pharynx pink and moist and no exudates.   Neck:  no masses.   Lungs:  normal respiratory effort and normal breath sounds.   Heart:  normal rate, regular rhythm, and no murmur.   Abdomen:  soft, non-tender, and normal bowel sounds.   Skin:  healed skin lesion on R flank. non-tender, non-fluctuant. L flank with  ~3 cm linear lesion, non-tender, non-fluctuant. no surrounding erythema.     Impression & Recommendations:  Problem # 1:  MRSA (ICD-041.12)  would- 1) contiue to use bactroban 5 days a month for a total of 3 months (or until she delivers). 2) use clindamycin (sensitive to Erythro and Clinda) for next outbreak. 3) cont to use CBG baths, weekly is probably more than adequate. 4) No sharing of towels or linens. 5) wash clothes in hot water. 6) she has adquately treated her family members (CBG baths and bactroban). 7) she will need vancomycin as perioperative antibiotics if she has a c-section. she is at increased risk for infection/complication at surgery.  return to clinic as needed , with next outbreak.   Orders: Consultation Level IV ([address])  Medications Added to Medication List This Visit: 1)  Promethazine Hcl 25 Mg Tabs (Promethazine hcl) .... Four times a day as needed nausea 2)  Zofran Odt 4 Mg Tbdp (Ondansetron) .... Three times a day as needed nausea 3)  Tamiflu 75 Mg Caps (Oseltamivir phosphate) .... Once daily 4)  Zithromax 1 Gm Pack (Azithromycin) 5)  Cleocin 300 Mg Caps (Clindamycin hcl) .... Take 1 tablet by mouth four times a day Prescriptions: CLEOCIN 300 MG CAPS (CLINDAMYCIN HCL) Take 1 tablet by mouth four times a day  #40 x 1   Entered and Authorized by:   Johny Sax MD   Signed by:   Johny Sax MD on 03/07/2009   Method used:   Electronically to        [facility]. [phone]* (retail)       [address]       [place], [place]  [address]       Ph: [phone]       Fax: [phone]   RxID:   [id number]

## 2010-02-21 NOTE — Procedures (Signed)
Summary: Colonoscopy  Patient: [name] Note: All result statuses are Final unless otherwise noted.  Tests: (1) Colonoscopy (COL)   COL Colonoscopy           [facility]      [facility]     [address]     [place], [place]  [address]           COLONOSCOPY PROCEDURE REPORT           PATIENT:  [name], [name]  MR#:  [id number]     BIRTHDATE:  09/22/1975, 34 yrs. old  GENDER:  female     ENDOSCOPIST:  Vania Rea. Jarold Motto, MD, [facility]     REF. BY:  Juline Patch, M.D.     PROCEDURE DATE:  08/05/2009     PROCEDURE:  Colonoscopy with snare polypectomy     ASA CLASS:  Class II     INDICATIONS:  unexplained diarrhea, hematochezia, Routine Risk     Screening     MEDICATIONS:   Fentanyl 75 mcg IV, Versed 9 mg IV           DESCRIPTION OF PROCEDURE:   After the risks benefits and     alternatives of the procedure were thoroughly explained, informed     consent was obtained.  Digital rectal exam was performed and     revealed no abnormalities.   The LB [id number] endoscope     was introduced through the anus and advanced to the terminal ileum     which was intubated for a short distance, limited by poor     preparation.    The quality of the prep was poor, using MoviPrep.     The instrument was then slowly withdrawn as the colon was fully     examined.     <<PROCEDUREIMAGES>>           FINDINGS:  There were multiple polyps identified and removed. FLAT     5-7 MM POLYPS HOT AND COLD SNARE EXCISED.VERY,VERY POOR PREP.     This was otherwise a normal examination of the colon.   Retroflexed     views in the rectum revealed no abnormalities.    The scope was     then withdrawn from the patient and the procedure completed.           COMPLICATIONS:  None     ENDOSCOPIC IMPRESSION:     1) Polyps, multiple     2) Otherwise normal examination     R/O ADENOMAS.     RECOMMENDATIONS:     REPEAT EXAM 1 YEAR WITH DOUBLE BOWEL PREP.     REPEAT EXAM:  No        ______________________________     Vania Rea. Jarold Motto, MD, [facility]           CC:           n.     eSIGNED:   Vania Rea. Thelmer Legler at 08/05/2009 02:29 PM           Lucrezia Europe, [id number]  Note: An exclamation mark (!) indicates a result that was not dispersed into the flowsheet. Document Creation Date: 08/05/2009 2:29 PM _______________________________________________________________________  (1) Order result status: Final Collection or observation date-time: 08/05/2009 14:22 Requested date-time:  Receipt date-time:  Reported date-time:  Referring Physician:   Ordering Physician: Sheryn Bison [phone]) Specimen Source:  Source: Launa Grill Order Number: [id number] Lab site:   Appended Document: Colonoscopy     Procedures Next Due  Date:    Colonoscopy: 07/2014

## 2010-02-21 NOTE — Progress Notes (Signed)
Summary: Gastric emptying scan  Phone Note Outgoing Call   Call placed by: Ashok Cordia RN,  August 08, 2009 9:12 AM Summary of Call: Pt needs Gastric emptying scan scheduled. per Dr. Jarold Motto.   Sch for Aug 4, 8:00 at [facility].  NPo after Midnight. Initial call taken by: Ashok Cordia RN,  August 08, 2009 9:13 AM  Follow-up for Phone Call        Pt notified of appt for scan.   Follow-up by: Ashok Cordia RN,  August 08, 2009 9:19 AM

## 2010-02-21 NOTE — Assessment & Plan Note (Signed)
Summary: IBS...EM   History of Present Illness Visit Type: Follow-up Visit Primary GI MD: Sheryn Bison MD FACP FAGA Primary Provider: Juline Patch, MD  Requesting Provider: na  Chief Complaint: Rectal bleeding, and nausea History of Present Illness:   Continued rather constant epigastric discomfort with almost daily nausea and vomiting and acid reflux symptoms refractory to promethazine, Zofran, and over-the-counter H2 blockers. She also has continued watery diarrhea with intermittent hematochezia. Her previous workup was complicated by pregnancy, and she delivered a normal term infant 5 weeks ago.  Flexible sigmoidoscopy was never completed. Multiple labs were generally unremarkable. She continues on Synthroid daily,Valacyclor 500 mg a day, and multivitamins. She denies abuse of NSAIDs, alcohol, or cigarettes. As before, she has a family history of colon carcinoma, history of polycystic ovary disease, obesity, migraine headaches, and bipolar disease disorder.Previous endoscopy by Dr. Virginia Rochester was unremarkable 2 years ago, and she had a negative ultrasound this past fall.   GI Review of Systems    Reports nausea.      Denies abdominal pain, acid reflux, belching, bloating, chest pain, dysphagia with liquids, dysphagia with solids, heartburn, loss of appetite, vomiting, vomiting blood, weight loss, and  weight gain.      Reports rectal bleeding.     Denies anal fissure, black tarry stools, change in bowel habit, constipation, diarrhea, diverticulosis, fecal incontinence, heme positive stool, hemorrhoids, irritable bowel syndrome, jaundice, light color stool, liver problems, and  rectal pain.    Current Medications (verified): 1)  Synthroid 112 Mcg Tabs (Levothyroxine Sodium) .... One Tablet By Mouth Once Daily 2)  Prenate Essential 28-0.6-0.4-340 Mg Caps (Prenat W/o A-Fe-Methf-Fa-Omega) .... Once Daily 3)  Valacyclovir Hcl 500 Mg Tabs (Valacyclovir Hcl) .... Take 1 Tablet By Mouth Once A Day  Per Referring Md 4)  Promethazine Hcl 25 Mg Tabs (Promethazine Hcl) .... Four Times A Day As Needed Nausea 5)  Zofran Odt 4 Mg Tbdp (Ondansetron) .... Three Times A Day As Needed Nausea  Allergies (verified): 1)  ! Keflex  Past History:  Past medical, surgical, family and social histories (including risk factors) reviewed for relevance to current acute and chronic problems.  Past Medical History: Reviewed history from 03/07/2009 and no changes required. Current Problems:  SLEEP APNEA (ICD-780.57) UTI (ICD-599.0) NAUSEA AND VOMITING (ICD-787.01) HEMORRHOIDS (ICD-455.6) RECTAL BLEEDING (ICD-569.3) ADENOCARCINOMA, COLON, FAMILY HX (ICD-V16.0) CERVICAL CANCER (ICD-180.9) HEADACHE, CHRONIC (ICD-784.0) DIARRHEA (ICD-787.91) DEPRESSION (ICD-311) POLYCYSTIC OVARIES (ICD-256.4) OBESITY, MORBID (ICD-278.01) MIGRAINE HEADACHE (ICD-346.90) HYPOTHYROIDISM (ICD-244.9) BIPOLAR II DISORDER (ICD-296.89) HYPERTENSION (ICD-401.9) MRSA Boils  Past Surgical History: Reviewed history from 12/01/2008 and no changes required. Appendectomy Caesarean section Carpel tunnel release Lipoma removal x 3  Family History: Reviewed history from 12/07/2008 and no changes required. Family History of Colon Cancer:Paternal Uncles x 3  Family History of Colon Polyps:Father  Family History of Colitis: Father  Family History of Prostate Cancer: MGF  Family History of Diabetes: PGF Family History of Heart Disease: Son, MGF, and PGF   Social History: Reviewed history from 12/07/2008 and no changes required. Occupation: Cpht Divorced 2 childern----pregnant with another Patient has never smoked.  Alcohol Use - no Daily Caffeine Use: 2 daily  Illicit Drug Use - no Patient does not get regular exercise.   Review of Systems       The patient complains of fatigue.  The patient denies allergy/sinus, anemia, anxiety-new, arthritis/joint pain, back pain, blood in urine, breast changes/lumps, change in  vision, confusion, cough, coughing up blood, depression-new, fainting, fever, headaches-new, hearing problems, heart murmur,  heart rhythm changes, itching, menstrual pain, muscle pains/cramps, night sweats, nosebleeds, pregnancy symptoms, shortness of breath, skin rash, sleeping problems, sore throat, swelling of feet/legs, swollen lymph glands, thirst - excessive , urination - excessive , urination changes/pain, urine leakage, vision changes, and voice change.    Vital Signs:  Patient profile:   35 year old female Height:      66 inches Weight:      274 pounds BMI:     44.38 BSA:     2.29 Pulse rate:   76 / minute Pulse rhythm:   regular BP sitting:   128 / 74  (left arm) Cuff size:   regular  Vitals Entered By: Ok Anis CMA (July 22, 2009 9:53 AM)  Physical Exam  General:  Well developed, well nourished, no acute distress.obese.  obese.   Head:  Normocephalic and atraumatic. Eyes:  PERRLA, no icterus. Lungs:  Clear throughout to auscultation. Heart:  Regular rate and rhythm; no murmurs, rubs,  or bruits. Abdomen:  Soft, nontender and nondistended. No masses, hepatosplenomegaly or hernias noted. Normal bowel sounds. Rectal:  deferred until time of colonoscopy.  deferred until time of colonoscopy.   Msk:  Symmetrical with no gross deformities. Normal posture. Pulses:  Normal pulses noted. Extremities:  No clubbing, cyanosis, edema or deformities noted. Neurologic:  Alert and  oriented x4;  grossly normal neurologically. Psych:  Alert and cooperative. Normal mood and affect.   Impression & Recommendations:  Problem # 1:  ABDOMINAL PAIN-EPIGASTRIC (ICD-789.06) Assessment Unchanged Change 2 AcipHex 20 mg a day, an endoscopy scheduled and we will evaluate for H. pylori. Orders: Colon/Endo (Colon/Endo) TLB-TSH (Thyroid Stimulating Hormone) (84443-TSH) TLB-Hepatic/Liver Function Pnl (80076-HEPATIC) TLB-B12, Serum-Total ONLY (19147-W29) TLB-Ferritin (82728-FER) TLB-Folic Acid  (Folate) (82746-FOL) TLB-Iron, (Fe) Total (83540-FE) TLB-IBC Pnl (Iron/FE;Transferrin) (83550-IBC) TLB-Sedimentation Rate (ESR) (85652-ESR) TLB-Amylase (82150-AMYL) TLB-Lipase (83690-LIPASE) TLB-IgA (Immunoglobulin A) (82784-IGA) T-Sprue Panel (Celiac Disease Aby Eval) (83516x3/[address])  Problem # 2:  NAUSEA AND VOMITING (ICD-787.01) Assessment: Unchanged Possible GI motility disorder and may need gastric emptying scan.  Problem # 3:  RECTAL BLEEDING (ICD-569.3) Assessment: Unchanged Outpatient Colonoscopy scheduler her convenience. Of course,underlying inflammatory bowel disease needs to be excluded, and she does have a family history of colon cancer. Orders: Colon/Endo (Colon/Endo) TLB-TSH (Thyroid Stimulating Hormone) (84443-TSH) TLB-Hepatic/Liver Function Pnl (80076-HEPATIC) TLB-B12, Serum-Total ONLY (56213-Y86) TLB-Ferritin (82728-FER) TLB-Folic Acid (Folate) (82746-FOL) TLB-Iron, (Fe) Total (83540-FE) TLB-IBC Pnl (Iron/FE;Transferrin) (83550-IBC) TLB-Sedimentation Rate (ESR) (85652-ESR) TLB-Amylase (82150-AMYL) TLB-Lipase (83690-LIPASE) TLB-IgA (Immunoglobulin A) (82784-IGA) T-Sprue Panel (Celiac Disease Aby Eval) (83516x3/[address])  Patient Instructions: 1)  Get labs drawn today in the basement.  2)  Colonoscopy and Flexible Sigmoidoscopy brochure given.  3)  Upper Endoscopy brochure given. 4)  Pick up your prescription from your pharmacy.   5)  Copy sent to : Juline Patch, MD 6)  The medication list was reviewed and reconciled.  All changed / newly prescribed medications were explained.  A complete medication list was provided to the patient / caregiver. Prescriptions: MOVIPREP 100 GM  SOLR (PEG-KCL-NACL-NASULF-NA ASC-C) As per prep instructions.  #1 x 0   Entered by:   Christie Nottingham CMA (AAMA)   Authorized by:   Mardella Layman MD [facility]   Signed by:   Mardella Layman MD [facility] on 07/22/2009   Method used:   Electronically to        [facility]. [phone]* (retail)       [address]       [facility]  [place], [place]  [address]       Ph: [phone]       Fax: [phone]   RxID:   [id number] ACIPHEX 20 MG TBEC (RABEPRAZOLE SODIUM) one tablet by mouth once daily  #30 x 11   Entered by:   Christie Nottingham CMA (AAMA)   Authorized by:   Mardella Layman MD [facility]   Signed by:   Mardella Layman MD [facility] on 07/22/2009   Method used:   Electronically to        [facility]. [phone]* (retail)       [address]       [place], [place]  [address]       Ph: [phone]       Fax: [phone]   RxID:   [id number]   Appended Document: IBS...EM ADD TO DX. LIAST...BIPOLAR DISORDER  Appended Document: IBS...EM    Clinical Lists Changes  Problems: Added new problem of BIPOLAR DISORDER UNSPECIFIED (ICD-296.80)

## 2010-03-06 ENCOUNTER — Inpatient Hospital Stay (INDEPENDENT_AMBULATORY_CARE_PROVIDER_SITE_OTHER)
Admission: RE | Admit: 2010-03-06 | Discharge: 2010-03-06 | Disposition: A | Discharge status: Home / Self Care | Payer: Medicaid Other | Source: Ambulatory Visit | Attending: Family Medicine | Admitting: Family Medicine

## 2010-03-06 DIAGNOSIS — R319 Hematuria, unspecified: Secondary | ICD-10-CM

## 2010-03-06 LAB — POCT I-STAT, CHEM 8
Creatinine, Ser: 0.9 mg/dL (ref 0.4–1.2)
Glucose, Bld: 125 mg/dL — ABNORMAL HIGH (ref 70–99)
Hemoglobin: 13.9 g/dL (ref 12.0–15.0)
Potassium: 4 mEq/L (ref 3.5–5.1)
TCO2: 27 mmol/L (ref 0–100)

## 2010-03-06 LAB — POCT URINALYSIS DIPSTICK
Nitrite: NEGATIVE
Protein, ur: 30 mg/dL — AB
Urobilinogen, UA: 0.2 mg/dL (ref 0.0–1.0)
pH: 5 (ref 5.0–8.0)

## 2010-04-05 ENCOUNTER — Encounter (HOSPITAL_COMMUNITY): Payer: Self-pay

## 2010-04-05 ENCOUNTER — Emergency Department (HOSPITAL_COMMUNITY): Payer: Medicaid Other

## 2010-04-05 ENCOUNTER — Observation Stay (HOSPITAL_COMMUNITY)
Admission: EM | Admit: 2010-04-05 | Discharge: 2010-04-05 | Disposition: A | Discharge status: Home / Self Care | Payer: Medicaid Other | Attending: Urology | Admitting: Urology

## 2010-04-05 DIAGNOSIS — N1330 Unspecified hydronephrosis: Secondary | ICD-10-CM | POA: Insufficient documentation

## 2010-04-05 DIAGNOSIS — N201 Calculus of ureter: Principal | ICD-10-CM | POA: Insufficient documentation

## 2010-04-05 DIAGNOSIS — R9431 Abnormal electrocardiogram [ECG] [EKG]: Secondary | ICD-10-CM | POA: Insufficient documentation

## 2010-04-05 DIAGNOSIS — E039 Hypothyroidism, unspecified: Secondary | ICD-10-CM | POA: Insufficient documentation

## 2010-04-05 DIAGNOSIS — K219 Gastro-esophageal reflux disease without esophagitis: Secondary | ICD-10-CM | POA: Insufficient documentation

## 2010-04-05 DIAGNOSIS — G4733 Obstructive sleep apnea (adult) (pediatric): Secondary | ICD-10-CM | POA: Insufficient documentation

## 2010-04-05 DIAGNOSIS — Z8673 Personal history of transient ischemic attack (TIA), and cerebral infarction without residual deficits: Secondary | ICD-10-CM | POA: Insufficient documentation

## 2010-04-05 DIAGNOSIS — N200 Calculus of kidney: Secondary | ICD-10-CM | POA: Insufficient documentation

## 2010-04-05 LAB — DIFFERENTIAL
Basophils Absolute: 0 10*3/uL (ref 0.0–0.1)
Basophils Relative: 0 % (ref 0–1)
Eosinophils Absolute: 0.1 10*3/uL (ref 0.0–0.7)
Eosinophils Relative: 1 % (ref 0–5)
Lymphocytes Relative: 11 % — ABNORMAL LOW (ref 12–46)
Lymphs Abs: 1.6 10*3/uL (ref 0.7–4.0)
Monocytes Absolute: 0.8 K/uL (ref 0.1–1.0)
Monocytes Relative: 6 % (ref 3–12)
Neutro Abs: 11.6 K/uL — ABNORMAL HIGH (ref 1.7–7.7)
Neutrophils Relative %: 82 % — ABNORMAL HIGH (ref 43–77)

## 2010-04-05 LAB — CBC
HCT: 40.1 % (ref 36.0–46.0)
Hemoglobin: 13.1 g/dL (ref 12.0–15.0)
MCH: 30.1 pg (ref 26.0–34.0)
MCHC: 32.7 g/dL (ref 30.0–36.0)
MCV: 92.2 fL (ref 78.0–100.0)
Platelets: 186 10*3/uL (ref 150–400)
RBC: 4.35 MIL/uL (ref 3.87–5.11)
RDW: 13.4 % (ref 11.5–15.5)
WBC: 14.1 10*3/uL — ABNORMAL HIGH (ref 4.0–10.5)

## 2010-04-05 LAB — URINALYSIS, ROUTINE W REFLEX MICROSCOPIC
Glucose, UA: NEGATIVE mg/dL
Protein, ur: NEGATIVE mg/dL

## 2010-04-05 LAB — BASIC METABOLIC PANEL
Chloride: 102 mEq/L (ref 96–112)
GFR calc Af Amer: >60 mL/min (ref >60)
Potassium: 4.5 mEq/L (ref 3.5–5.1)
Sodium: 135 mEq/L (ref 135–145)

## 2010-04-05 LAB — URINE MICROSCOPIC-ADD ON

## 2010-04-05 LAB — BASIC METABOLIC PANEL WITH GFR
BUN: 15 mg/dL (ref 6–23)
CO2: 27 meq/L (ref 19–32)
Calcium: 9.4 mg/dL (ref 8.4–10.5)
Creatinine, Ser: 0.9 mg/dL (ref 0.4–1.2)
GFR calc non Af Amer: >60 mL/min (ref >60)
Glucose, Bld: 290 mg/dL — ABNORMAL HIGH (ref 70–99)

## 2010-04-06 NOTE — Op Note (Signed)
  [name], [name]     ACCOUNT NO.:  [id number]  MEDICAL RECORD NO.:  [id number]           PATIENT TYPE:  O  LOCATION:  0001                         FACILITY:  [facility]  PHYSICIAN:  Martina Sinner, MD DATE OF BIRTH:  1975-06-23  DATE OF PROCEDURE:  04/05/2010 DATE OF DISCHARGE:                              OPERATIVE REPORT   DIAGNOSIS:  Left ureteral stones.  SURGERY:  Cystoscopy, left retrograde urethrogram, insertion left ureteral stent.  HISTORY:  [name] has pain that was not settling down in the emergency room from left ureteral colic.  She had an 8-mm ureterovesical junction stone with a 1 mm to 2 mm fragment proximal to it.  She had a 5- mm stone at the ureteropelvic junction.  She has a nonobstructing stone, 4 mm in size, in the left kidney.  The lead stone was definitely obstructing but also the proximal stone could have been.  DESCRIPTION OF PROCEDURE:  The patient was prepped and draped in usual fashion.  Preoperative ciprofloxacin was given.  She was not septic. She was given multiple doses Dilaudid and Toradol in the emergency room.  She is allergic KEFLEX.  The patient was prepped and draped in usual fashion.  She is quite obese but with leg positioning it was easier to see inside her bladder. Bladder mucosa and trigone were normal.  There was no stitch, foreign body or carcinoma.  There was no periureteral edema.  There was no stone in the bladder.  Under fluoroscopic guidance, I did a gentle left retrograde ureterogram inserting a 6-French open-ended ureteral catheter approximately 1 cm in the proximal end of the distal left ureter.  I could see a filling defect distally and moderately dilated ureter proximal to this.  I was able to pass a sensor wire easily through the ureteral catheter curling up into the kidney.  I wanted to do a full retrograde ureterogram and marked position of the renal pelvis but the ureteral catheter  subsequently could not pass by the distal stone.  When I encountered the vertebra and saw how the wire curled in the kidney, I was confident that this sensor wire was in the kidney.  I removed the open-ended ureteral catheter and I inserted a 24 cm x 6- French double-J stent with no string.  It went in rather smoothly.  It curled up nicely in the renal pelvis with what appeared to be the renal pelvis and visually in the bladder.  Hard copy films were taken.  RETROGRADE URETEROGRAM:  Retrograde ureterogram was done with 45 cc of contrast outlining the distal ureter.  Technique was described above.  Bladder was emptied and the patient was taken to the recovery room.  She may need ureteroscopy to reach her treatment goal.  The role of lithotripsy will be entertained.  We will proceed accordingly.          ______________________________ Martina Sinner, MD     SAM/MEDQ  D:  04/05/2010  T:  04/06/2010  Job:  [id number]  Electronically Signed by Alfredo Martinez MD on 04/06/2010 12:51:44 PM

## 2010-04-07 LAB — ACETAMINOPHEN LEVEL
Acetaminophen (Tylenol), Serum: 15.6 ug/mL (ref 10–30)
Acetaminophen (Tylenol), Serum: 20.4 ug/mL (ref 10–30)

## 2010-04-07 LAB — COMPREHENSIVE METABOLIC PANEL
Albumin: 3.5 g/dL (ref 3.5–5.2)
BUN: 9 mg/dL (ref 6–23)
Chloride: 110 mEq/L (ref 96–112)
Creatinine, Ser: 0.75 mg/dL (ref 0.4–1.2)
Total Bilirubin: 0.5 mg/dL (ref 0.3–1.2)

## 2010-04-07 LAB — CBC
HCT: 38.1 % (ref 36.0–46.0)
Hemoglobin: 13 g/dL (ref 12.0–15.0)
MCH: 30 pg (ref 26.0–34.0)
MCHC: 34.1 g/dL (ref 30.0–36.0)

## 2010-04-07 LAB — RAPID URINE DRUG SCREEN, HOSP PERFORMED
Barbiturates: NOT DETECTED
Benzodiazepines: POSITIVE — AB
Cocaine: NOT DETECTED

## 2010-04-07 LAB — SALICYLATE LEVEL: Salicylate Lvl: <4 mg/dL (ref 2.8–20.0)

## 2010-04-07 LAB — TSH: TSH: 3.31 u[IU]/mL (ref 0.350–4.500)

## 2010-04-09 LAB — CREATININE, SERUM: Creatinine, Ser: 0.58 mg/dL (ref 0.4–1.2)

## 2010-04-09 LAB — URINALYSIS, ROUTINE W REFLEX MICROSCOPIC
Bilirubin Urine: NEGATIVE
Glucose, UA: NEGATIVE mg/dL
Specific Gravity, Urine: 1.025 (ref 1.005–1.030)
Urobilinogen, UA: 0.2 mg/dL (ref 0.0–1.0)
pH: 6 (ref 5.0–8.0)

## 2010-04-09 LAB — CBC
HCT: 31.7 % — ABNORMAL LOW (ref 36.0–46.0)
Hemoglobin: 10.5 g/dL — ABNORMAL LOW (ref 12.0–15.0)
MCHC: 33.2 g/dL (ref 30.0–36.0)
RBC: 3.32 MIL/uL — ABNORMAL LOW (ref 3.87–5.11)
RDW: 15.6 % — ABNORMAL HIGH (ref 11.5–15.5)

## 2010-04-09 LAB — URINE MICROSCOPIC-ADD ON

## 2010-04-09 LAB — DIFFERENTIAL
Basophils Absolute: 0 10*3/uL (ref 0.0–0.1)
Basophils Relative: 0 % (ref 0–1)
Eosinophils Relative: 1 % (ref 0–5)
Monocytes Absolute: 0.6 10*3/uL (ref 0.1–1.0)
Monocytes Relative: 5 % (ref 3–12)

## 2010-04-09 LAB — URINE CULTURE: Culture: NO GROWTH

## 2010-04-10 LAB — GLUCOSE, RANDOM: Glucose, Bld: 84 mg/dL (ref 70–99)

## 2010-04-10 LAB — CBC
HCT: 38.7 % (ref 36.0–46.0)
MCHC: 33.8 g/dL (ref 30.0–36.0)
MCHC: 35.2 g/dL (ref 30.0–36.0)
MCV: 94.8 fL (ref 78.0–100.0)
MCV: 94.9 fL (ref 78.0–100.0)
Platelets: 146 10*3/uL — ABNORMAL LOW (ref 150–400)
Platelets: 171 10*3/uL (ref 150–400)
RBC: 3.13 MIL/uL — ABNORMAL LOW (ref 3.87–5.11)
RBC: 4.09 MIL/uL (ref 3.87–5.11)

## 2010-04-10 LAB — GLUCOSE, CAPILLARY
Glucose-Capillary: 102 mg/dL — ABNORMAL HIGH (ref 70–99)
Glucose-Capillary: 112 mg/dL — ABNORMAL HIGH (ref 70–99)
Glucose-Capillary: 81 mg/dL (ref 70–99)
Glucose-Capillary: 81 mg/dL (ref 70–99)
Glucose-Capillary: 89 mg/dL (ref 70–99)
Glucose-Capillary: 97 mg/dL (ref 70–99)

## 2010-04-11 ENCOUNTER — Other Ambulatory Visit (HOSPITAL_COMMUNITY): Payer: Self-pay | Admitting: Urology

## 2010-04-11 DIAGNOSIS — N200 Calculus of kidney: Secondary | ICD-10-CM

## 2010-04-12 ENCOUNTER — Ambulatory Visit (HOSPITAL_COMMUNITY)
Admission: RE | Admit: 2010-04-12 | Discharge: 2010-04-12 | Disposition: A | Discharge status: Home / Self Care | Payer: Medicaid Other | Source: Ambulatory Visit | Attending: Urology | Admitting: Urology

## 2010-04-12 DIAGNOSIS — K439 Ventral hernia without obstruction or gangrene: Secondary | ICD-10-CM | POA: Insufficient documentation

## 2010-04-12 DIAGNOSIS — K7689 Other specified diseases of liver: Secondary | ICD-10-CM | POA: Insufficient documentation

## 2010-04-12 DIAGNOSIS — N201 Calculus of ureter: Secondary | ICD-10-CM | POA: Insufficient documentation

## 2010-04-12 DIAGNOSIS — N200 Calculus of kidney: Secondary | ICD-10-CM

## 2010-04-18 NOTE — H&P (Signed)
[name], [name]             ACCOUNT NO.:  [id number]  MEDICAL RECORD NO.:  [id number]           PATIENT TYPE:  E  LOCATION:  WLED                         FACILITY:  [facility]  PHYSICIAN:  Martina Sinner, MD DATE OF BIRTH:  1975-11-22  DATE OF ADMISSION:  04/05/2010 DATE OF DISCHARGE:                             HISTORY & PHYSICAL   CHIEF COMPLAINT: 1. Left flank pain. 2. Left hydroureteronephrosis. 3. Left obstructive 8 mm ureterovesical junction calculus. 4. Left 5-mm ureteropelvic calculus.  HISTORY OF PRESENT ILLNESS:  This is a 35 year old female who began having left flank pain last p.m. at approximately 9:00 p.m.  She took some Tylenol with no relief.  At approximately 4:00 a.m., she presented to the emergency room secondary to her non pain control.  She states that the pain radiates from her left flank to her left lower abdomen. It is associated with nausea.  She denies any vomiting, fever, chills, or diarrhea.  She does complain of urinary urgency and urinary frequency stating that after straining, she is still only able to urinate a few drops at a time.  She also has complaints of hematuria.  She has a history of hematuria which she states began approximately 1 year ago while she was pregnant with her third child.  Her hematuria had been on and off and has not been associated with any painful symptoms. She presented to [facility] in February 2012 for continued complaints of hematuria which was asymptomatic.  Urinalysis and culture were negative at that time.  She was placed for an appointment with Dr. Aldean Ast for evaluation of her hematuria on April 13, 2010.  She does have a history of nephrolithiasis with her last kidney stone being passed in 1997.  PAST MEDICAL HISTORY: 1. GERD. 2. Hypothyroidism. 3. Migraines. 4. Polycystic ovarian disease. 5. Nephrolithiasis.  PAST SURGICAL HISTORY: 1. Appendectomy. 2. Cesarean section. 3. Carpal tunnel  release.  FAMILY HISTORY:  She has no family history of kidney cancer, bladder cancer, or prostate cancer.  SOCIAL HISTORY:  She is married.  She has 3 children, 14 years, 7 years, and 9 months.  She is a stay-at-home mom.  She denies any tobacco or alcohol use.  She denies any history of tobacco use.  ALLERGIES:  She has an allergic reaction to [facility] which causes her throat to swell.  HOME MEDICATIONS: 1. Atenolol 50 mg once daily. 2. Effexor 225 mg once daily. 3. Synthroid 112 mcg once daily. 4. Nexium once daily. 5. Acyclovir once daily. 6. Tramadol as needed. 7. Prenatal vitamins once daily.  REVIEW OF SYSTEMS:  As stated per HPI consisting of left flank pain with radiation to left abdomen.  Positive complaints of nausea and hematuria. Complaints of urinary urgency and frequency.  She denies any vomiting, fever, chills, or diarrhea.  She denies any chest pain, shortness of breath, or headache.  PHYSICAL EXAMINATION:  VITAL SIGNS:  Temperature 98.1, pulse 77, respirations 16, and blood pressure 176/102. CONSTITUTIONAL:  She is a well-developed obese white female in no acute distress, although occasionally grimaces with pain. HEENT:  Normocephalic, atraumatic.  Oropharynx clear. ABDOMEN:  Soft, obese, nontender,  and nondistended.  Positive left CVA tenderness and mild suprapubic tenderness. GU:  Foley catheter is inserted within urinary meatus draining clear yellow urine. EXTREMITIES:  1+ nonpitting edema.  Positive pulses.  No atrophy. CARDIOVASCULAR:  Regular rate and rhythm. LUNGS:  Clear to auscultate. SKIN:  Psoriatic appearance to bilateral elbows.  Warm, dry, and intact. NEURO:  Remote and recent memory are intact.  LABORATORY DATA: 1. Sodium is 135, potassium 4.5, chloride 102, CO2 of 27, BUN 15,     creatinine 0.9.  Hemoglobin is 290. 2. WBC is 14.1, hemoglobin 13.1, hematocrit 40.1, and platelets are     186,000. 3. Urinalysis; specific gravity 1.022, pH  6.0, blood large amount,     rest of dipstick negative.  Urine micro; wbc's 0-2, rbc's too     numerous to count, and bacteria few.  RADIOLOGY:  CT of abdomen and pelvis. 1. Obstructing left 8-mm calculus at UVJ. 2. A 5- mm left UPJ calculus. 3. Nonobstructing 4-mm left renal calculus. 4. Left hydroureteronephrosis secondary to obstruction.  IMPRESSION/PLAN: 1. Obstructing left 8-mm ureterovesical junction calculus with     hydroureteronephrosis. 2. A 5-mm left ureteropelvic junction calculus with hydronephrosis. 3. Nonobstructing left renal 4-mm calculus. 4. Hematuria ongoing for approximately 1 year.  I will place her n.p.o. and place her on surgical scheduled for cystoscopy to evaluate bladder for hematuria.  we will also perform left retrograde pyelography and left double J stent placement for decompression of her kidney.  We will attempt to allow her to pass her renal stones on her own with stents in place or Dr. Sherron Monday may talk with her about electro shockwave lithotripsy on an outpatient basis next week.  We will give her antibiotics on call to the operating room.  If her surgical procedure goes well, we will plan to discharge home after her surgical procedure tonight and have her follow up next week with Dr. Sherron Monday for definitive treatment of her kidney stones.     Delia Chimes, NP   ______________________________ Martina Sinner, MD    MA/MEDQ  D:  04/05/2010  T:  04/05/2010  Job:  [id number]  Electronically Signed by Delia Chimes NP on 04/11/2010 12:47:57 PM Electronically Signed by Alfredo Martinez MD on 04/18/2010 12:55:12 PM

## 2010-04-26 LAB — DIFFERENTIAL
Band Neutrophils: 0 % (ref 0–10)
Basophils Absolute: 0 10*3/uL (ref 0.0–0.1)
Basophils Relative: 0 % (ref 0–1)
Blasts: 0 %
Eosinophils Absolute: 0 10*3/uL (ref 0.0–0.7)
Eosinophils Relative: 0 % (ref 0–5)
Lymphocytes Relative: 22 % (ref 12–46)
Lymphs Abs: 2.8 10*3/uL (ref 0.7–4.0)
Metamyelocytes Relative: 0 %
Monocytes Absolute: 0.4 10*3/uL (ref 0.1–1.0)
Monocytes Relative: 3 % (ref 3–12)
Myelocytes: 0 %
Neutro Abs: 9.4 10*3/uL — ABNORMAL HIGH (ref 1.7–7.7)
Neutrophils Relative %: 75 % (ref 43–77)
Promyelocytes Absolute: 0 %
nRBC: 0 /100 WBC

## 2010-04-26 LAB — CBC
HCT: 35.7 % — ABNORMAL LOW (ref 36.0–46.0)
Hemoglobin: 12 g/dL (ref 12.0–15.0)
MCHC: 33.6 g/dL (ref 30.0–36.0)
MCV: 91.9 fL (ref 78.0–100.0)
Platelets: 171 10*3/uL (ref 150–400)
RBC: 3.88 MIL/uL (ref 3.87–5.11)
RDW: 15 % (ref 11.5–15.5)
WBC: 12.6 10*3/uL — ABNORMAL HIGH (ref 4.0–10.5)

## 2010-04-26 LAB — CREATININE, SERUM
Creatinine, Ser: 0.61 mg/dL (ref 0.4–1.2)
GFR calc Af Amer: >60 mL/min (ref >60)

## 2010-04-26 LAB — GLUCOSE, CAPILLARY: Glucose-Capillary: 126 mg/dL — ABNORMAL HIGH (ref 70–99)

## 2010-05-01 LAB — URINALYSIS, ROUTINE W REFLEX MICROSCOPIC
Bilirubin Urine: NEGATIVE
Nitrite: NEGATIVE
Specific Gravity, Urine: 1.021 (ref 1.005–1.030)
Urobilinogen, UA: 0.2 mg/dL (ref 0.0–1.0)

## 2010-05-01 LAB — DIFFERENTIAL
Basophils Absolute: 0 10*3/uL (ref 0.0–0.1)
Lymphocytes Relative: 31 % (ref 12–46)
Lymphs Abs: 1.9 10*3/uL (ref 0.7–4.0)
Monocytes Absolute: 0.4 10*3/uL (ref 0.1–1.0)
Monocytes Relative: 6 % (ref 3–12)
Neutro Abs: 3.9 10*3/uL (ref 1.7–7.7)

## 2010-05-01 LAB — POCT CARDIAC MARKERS: Myoglobin, poc: 57.6 ng/mL (ref 12–200)

## 2010-05-01 LAB — URINE MICROSCOPIC-ADD ON

## 2010-05-01 LAB — CBC
HCT: 38.5 % (ref 36.0–46.0)
MCV: 89.3 fL (ref 78.0–100.0)
Platelets: 186 10*3/uL (ref 150–400)
RDW: 14.1 % (ref 11.5–15.5)

## 2010-05-01 LAB — URINE CULTURE

## 2010-05-01 LAB — COMPREHENSIVE METABOLIC PANEL
Albumin: 3.5 g/dL (ref 3.5–5.2)
BUN: 7 mg/dL (ref 6–23)
Calcium: 8.8 mg/dL (ref 8.4–10.5)
Creatinine, Ser: 0.71 mg/dL (ref 0.4–1.2)
Total Protein: 6.3 g/dL (ref 6.0–8.3)

## 2010-05-01 LAB — HCG, QUANTITATIVE, PREGNANCY: hCG, Beta Chain, Quant, S: <2 m[IU]/mL (ref <5)

## 2010-05-01 LAB — APTT: aPTT: 29 seconds (ref 24–37)

## 2010-05-01 LAB — PROTIME-INR: INR: 1.1 (ref 0.00–1.49)

## 2010-05-24 ENCOUNTER — Emergency Department (HOSPITAL_COMMUNITY): Admission: EM | Admit: 2010-05-24 | Payer: Medicaid Other | Source: Home / Self Care

## 2010-05-24 ENCOUNTER — Emergency Department (HOSPITAL_COMMUNITY)
Admission: EM | Admit: 2010-05-24 | Discharge: 2010-05-24 | Disposition: A | Discharge status: Home / Self Care | Payer: No Typology Code available for payment source | Attending: Emergency Medicine | Admitting: Emergency Medicine

## 2010-05-24 ENCOUNTER — Emergency Department (HOSPITAL_COMMUNITY): Payer: No Typology Code available for payment source

## 2010-05-24 DIAGNOSIS — Y929 Unspecified place or not applicable: Secondary | ICD-10-CM | POA: Insufficient documentation

## 2010-05-24 DIAGNOSIS — M25519 Pain in unspecified shoulder: Secondary | ICD-10-CM | POA: Insufficient documentation

## 2010-05-24 DIAGNOSIS — R079 Chest pain, unspecified: Secondary | ICD-10-CM | POA: Insufficient documentation

## 2010-05-24 DIAGNOSIS — K219 Gastro-esophageal reflux disease without esophagitis: Secondary | ICD-10-CM | POA: Insufficient documentation

## 2010-05-24 DIAGNOSIS — S139XXA Sprain of joints and ligaments of unspecified parts of neck, initial encounter: Secondary | ICD-10-CM | POA: Insufficient documentation

## 2010-05-24 DIAGNOSIS — F329 Major depressive disorder, single episode, unspecified: Secondary | ICD-10-CM | POA: Insufficient documentation

## 2010-05-24 DIAGNOSIS — E039 Hypothyroidism, unspecified: Secondary | ICD-10-CM | POA: Insufficient documentation

## 2010-05-24 DIAGNOSIS — F3289 Other specified depressive episodes: Secondary | ICD-10-CM | POA: Insufficient documentation

## 2010-06-06 NOTE — Discharge Summary (Signed)
NAMEMarland Kitchen  [name], [name] NO.:  [id number]   MEDICAL RECORD NO.:  [id number]          PATIENT TYPE:  INP   LOCATION:  1431                         FACILITY:  [facility]   PHYSICIAN:  Marcellus Scott, MD     DATE OF BIRTH:  16-Jul-1975   DATE OF ADMISSION:  12/04/2006  DATE OF DISCHARGE:                               DISCHARGE SUMMARY   INTERIM DISCHARGE SUMMARY:   DATE OF DISCHARGE:  To be determined.   PRIMARY CARE PHYSICIAN:  Juline Patch, M.D.   DISCHARGE DIAGNOSES:  1. Headache/neck pain secondary to mixed migraine & tension headache      versus musculoskeletal pain.  2. Hypothyroidism.  3. Hypertension.  4. Obesity.  5. Bipolar disorder  6. Transaminitis.  7. ADHD   DISCHARGE MEDICATIONS:  To be finalized on discharge.  1. Acyclovir 400 mg p.o. b.i.d.  2. Lamictal 150 mg p.o. daily.  3. Valproic acid 1000 mg p.o. b.i.d.  4. Synthroid 100 mcg p.o. daily.  5. Concerta ER 18 mg, 1 p.o. daily.  6. Topamax 50 mg p.o. daily.  7. Atenolol 25 mg p.o. daily.  8. Bacitracin ointment applied topically to affected areas 3 times a      day for 1 week.  9. Motrin 600 mg p.o. t.i.d. p.r.n.  10.Tylenol 650 mg p.o. q.4-6h. p.r.n.   PROCEDURE:  1. CT of the head with and without contrast.  Impression:  Stable,      normal exam.  No acute intracranial abnormality.  2. Neck CT with contrast.  Impression:  Prominent tonsillar pillars      and Waldeyer's ring tissue maybe related to recent or active      urinary tract infection.  No focal inflammation, abscess,      retropharyngeal process or lymphadenopathy.  Normal larynx.  3. CT of the chest with contrast.  Impression:  No acute      cardiopulmonary abnormalities.  Nonobstructing left upper pole      nephrolithiasis.  Hepatic steatosis.   PERTINENT LABORATORY DATA:  CBC:  Hemoglobin 14.7, hematocrit 42, white  blood cells 11, platelets 200.  Comprehensive metabolic panel:  Remarkable for glucose of 158, BUN 7,  creatinine 0.82.  AST 32, ALT 56,  albumin 3.1.  Valproic acid level 98.1, ammonia normal.  TSH 8.806.  CSF  studies were normal with CSF protein of 32 and CSF glucose of 86.   CONSULTATIONS:  Neurology, Pramod P. Pearlean Brownie, M.D. and Bevelyn Buckles. Champey,  M.D.   HOSPITAL COURSE AND PATIENT DISPOSITION:  For details of the initial  admission, please refer to the history and physical note.  In summary,  [name] is a pleasant 35 year old Caucasian female patient with  history of migraine headaches, hypothyroidism, herpes simplex type 2,  and bipolar disorder who presented to the emergency room with severe  headache for 48 hours prior to admission.  She was seen in the emergency  room. She had a CT of the head performed and a lumbar puncture which  were negative.  She was discharged home.  However, the lumbar puncture  did not provide any relief and the patient  returned with worsening  headache which was in the frontal bitemporal area and the neck.  Following this, the patient was admitted for further evaluation and  management.  Her intractable headache was thought to be secondary to  atypical migraine versus tension headache versus cluster headache.  She  was placed on IV Valium for muscle spasms and narcotic analgesics.  However, with no significant relief in pain, neurology was consulted.  They thought this patient had mixed migraine/tension headache. The  patient was provided with IV magnesium sulfate, Medrol Dosepak, IV DHE,  and muscle relaxants.  However, even with these measures, the patient  made very slow improvement. Her frontal and temporal headaches subsided,  but she still had persisting lower neck pain suggestive of  musculoskeletal or spasm-kind of pain.  Following this, the patient was  started on Zanaflex and Motrin. Today, the patient is significantly  better compared to yesterday, with decrease in her pain and the patient  feeling much better.  If the patient continues to  do well, she should be  ready for discharge in the next 24-48 hours.   The patient's TSH was elevated following which her Synthroid dose was  marginally increased from 88 mcg to 100 mcg daily. Her TSH should be  followed up in 4 weeks' time.      Marcellus Scott, MD  Electronically Signed     AH/MEDQ  D:  12/08/2006  T:  12/08/2006  Job:  [id number]   cc:   Juline Patch, M.D.  Fax: [phone]   Pramod P. Pearlean Brownie, MD  Fax: [phone]   Estanislado Pandy, MD  Fax: [phone]

## 2010-06-06 NOTE — H&P (Signed)
NAMEMarland Kitchen  KALIS, FRIESE NO.:  [id number]   MEDICAL RECORD NO.:  [id number]          PATIENT TYPE:  INP   LOCATION:  1431                         FACILITY:  [facility]   PHYSICIAN:  Della Goo, M.D. DATE OF BIRTH:  1975/06/19   DATE OF ADMISSION:  12/04/2006  DATE OF DISCHARGE:                              HISTORY & PHYSICAL   PRIMARY CARE PHYSICIAN:  Juline Patch, M.D.   CHIEF COMPLAINT:  Severe headache.   HISTORY OF PRESENT ILLNESS:  This is a 35 year old female who presents  to the emergency department with a severe headache which has gone on for  the past 48 hours.  She was seen in the emergency department 1 day ago,  was evaluated and had a CT scan performed, as well as an LP which  returned with negative results.  She returned to the emergency  department secondary to severe persistence and worsening of her  headache.   She describes the headache as being a severe headache that is pulsating  and throbbing which starts in the posterior neck area, an area where she  had a previous lipoma removal.  This pain radiates up her head and  travels cross both sides of her head to the frontal areas bilaterally.  She rates the pain as being at 10/10 plus.  She describes having nausea  and vomiting associated with the pain, along with light and sound  sensitivity.  She does report having a history of migraine headaches.  She denies having any fevers, chills, chest pain, shortness of breath or  diarrhea.  She denies having any loss of consciousness, as well.   PAST MEDICAL HISTORY:  1. Migraine headaches.  2. Hypothyroidism.  3. Herpes simplex II.  4. Depression.  5. Bipolar disorder.   PAST SURGICAL HISTORY:  1. History of lipomas x2 removed from her posterior neck area.  2. Appendectomy.  3. C-section.  4. D&E.  5. D&C.   MEDICATIONS:  1. Synthroid 0.88 mcg one p.o. daily.  2. Acyclovir 400 mg one p.o. b.i.d.  3. Concerta 54 mg one p.o. q.a.m.  4.  Lamictal 150 mg one p.o. daily.  5. Maxalt p.r.n. severe migraine headaches.  6. Ultram 50 mg one p.o. t.i.d. p.r.n.  7. Valproic acid 1000 mg p.o. b.i.d.  8. The patient was prescribed Parafon Forte 500 mg one p.o. t.i.d.      p.r.n. muscle spasms and headache pain from the emergency      department last p.m.   ALLERGIES:  Allergies to [facility], but she denies having allergies to any  other cephalosporins.   SOCIAL HISTORY:  Nonsmoker, nondrinker.   FAMILY HISTORY:  Coronary artery disease in both grandfathers.  Positive  for hypertension in her father.  Positive for diabetes mellitus in her  paternal grandfather and maternal aunt.   REVIEW OF SYSTEMS:  Pertinents are mentioned above.   PHYSICAL EXAMINATION FINDINGS:  GENERAL:  This is a morbidly obese 35-  year-old female in severe discomfort but no acute distress.  VITAL SIGNS:  Temperature of 98.4, blood pressure 165/91 to 146/98,  heart rate 71-63, respirations 28-18 and O2 saturations  96-93% on room  air.  HEENT:  Normocephalic, atraumatic.  There is light sensitivity but  pupils are reactive to light bilaterally and symmetric.  Extraocular  muscles are intact.  Funduscopic benign.  Oropharynx is clear.  NECK:  Supple.  No meningismus.  No thyromegaly, adenopathy, jugular  venous distention.  CARDIOVASCULAR:  Regular rate and rhythm.  No murmurs, gallops or rubs.  LUNGS:  Clear to auscultation bilaterally.  ABDOMEN:  Positive bowel sounds, soft, nontender, nondistended.  EXTREMITIES:  Without cyanosis, clubbing or edema.  NEUROLOGIC:  Alert and oriented.  Speech is clear.  Cranial nerves are  intact and there are no focal deficits on examination.   LABORATORY DATA:  Laboratory studies have been ordered, a CBC, glucose  check along with a chemistry.   ASSESSMENT:  45. A 35 year old female being admitted with intractable headache.  The      differential diagnoses do include atypical migraine versus tension      headache,  cluster headache and also post lumbar puncture headache.  2. Hypothyroidism.  3. Bipolar disorder.  4. Reactive hypertension.  5. Morbid obesity.   PLAN:  The patient will be admitted for pain control therapy.  Antiemetics have also been ordered and IV Valium has been ordered p.r.n.  muscle spasms.  The patient will be started on a dose of IV Solu-Medrol  for treatment of a vascular headache, as well.  The patient will also be  sent for a repeat CT scan of the head and cervical spine with IV  contrast.  DVT and GI prophylaxis have also been ordered, and a  neurology consultation will be placed.      Della Goo, M.D.  Electronically Signed    HJ/MEDQ  D:  12/04/2006  T:  12/05/2006  Job:  [id number]   cc:   Juline Patch, M.D.  Fax: [phone]

## 2010-06-06 NOTE — Consult Note (Signed)
NAME:  [name], [name]          ACCOUNT NO.:  [id number]   MEDICAL RECORD NO.:  [id number]          PATIENT TYPE:  INP   LOCATION:  1431                         [facility]ACILITY:  [facility]   PHYSICIAN:  Pramod P. Pearlean Brownie, MD    DATE OF BIRTH:  April 23, 1975   DATE OF CONSULTATION:  DATE OF DISCHARGE:                                 CONSULTATION   REFERRING PHYSICIAN:  __________  MD   REASON FOR REFERRAL:  Refractory headache.   HISTORY OF PRESENT ILLNESS:  [name] is a 35 year old Caucasian lady  who had severe refractory headache since the last 2 days.  She states  headache began Monday evening.  It began as an atypical headache with  severe neck pain, occipital pain which is sharp and throbbing in nature.  Also complaining of nausea, vomiting, light and sound sensitivity.  She  took her usual cocktail of headache rescue medication which she has been  prescribed by Dr. Amelia [name],  neurologist in [place].  She started  with 8 mg of ibuprofen followed by muscle relaxant and then Ultram.  When this did not work she tried even Maxalt and went to sleep. The next  day the headache was back, so she was seen in [facility] emergency room  where she underwent a CT scan of the head which was unremarkable.  She  also had a spinal tap which was uneventful.  She was given Dilaudid.  She had only a modest improvement in her headache.  She went home but  headache returned and does not really go away.  This morning the  headache was worsened and she came back for further evaluation.  She has  headaches with a frequency of once every 3-4 months. She has not been on  any headache prophylaxis.  She was seen by our practice several years  ago but has not been followed for the last 3-4 years.  She denies any  focal neurological symptoms with the headache.  There is no visual  disturbance with the headache.  She has tried some mild medications in  the past without relief. She is currently on Depakote and  that is for  bipolar disorder.   PAST MEDICAL HISTORY:  Significant for by bipolar disease.  Refractory  headache.  Hypothyroidism, depression.  Herpes simplex.   MEDICATIONS:  At home Synthroid, acyclovir, Lamictal, valproic acid,  Concerta, Valium.   ALLERGIES TO MEDICATIONS:  None.   SOCIAL HISTORY:  She is independent in activities of daily living.  She  does not smoke or drink or do drugs.   REVIEW OF SYSTEMS:  Positive for headache, nausea, vomiting. No chest  pain, fever, cough, shortness of breath or diarrhea.   PHYSICAL EXAM:  GENERAL APPEARANCE:  Exam reveals a pleasant obese young  Caucasian lady who appears to be in distress from her headache.  VITAL SIGNS:  She is afebrile at present with pulse rate of 78 per  minute regular, respiratory rate 16, distal pulses are well felt.  HEENT:  The head is nontraumatic.  I did not see significant muscle  spasm.  There is surgical scar from lipoma  remote surgeries noted in the  back of the neck.  CARDIAC:  Regular heart sounds.  LUNGS:  Clear to auscultation.  NEUROLOGICAL:  She is pleasant, awake, alert cooperative.  No aphasia or  proximal dysarthria.  Movements are full range without nystagmus.  Face  is symmetric.  Palatal movements normal.  Tongue is midline.  Motor  system exam reveals no upper extremity drift, symmetric strength, tone,  reflexes, coordination, sensation.  Gait was not tested.   DATA:  Reviewed CT scan of the head, noncontrast study is normal.   IMPRESSION:  A 31-year lady with refractory headache for the last 48  hours likely mixed migraine headaches with tension headache features.  The patient has failed anti-inflammatory drugs, muscle relaxants,  Dilaudid and Maxalt.   PLAN:  I would recommend treatment with IV DHE protocol and steroids to  break the cycle, 0.75 mg every 6 hours IV DHE accompanied by 10 mg  Reglan every 6 hours for 48 hours.  Intravenous hydration.  Watch for  chest pain.  If so to  check cardiac enzymes and EKG.  The Medrol Dosepak  of tapering steroids.  The patient can follow up electively with her  neurologist, Dr. Rene [name] the future after which she is  discharged.  Kindly call for questions.           ______________________________  [name]. Pearlean Brownie, MD     PPS/MEDQ  D:  12/04/2006  T:  12/05/2006  Job:  [id number]

## 2010-06-06 NOTE — Op Note (Signed)
NAMECLARENE, CURRAN             ACCOUNT NO.:  [id number]   MEDICAL RECORD NO.:  [id number]          PATIENT TYPE:  AMB   LOCATION:  SDC                           FACILITY:  [facility]   PHYSICIAN:  Roseanna Rainbow, M.D.DATE OF BIRTH:  July 09, 1975   DATE OF PROCEDURE:  07/21/2007  DATE OF DISCHARGE:                               OPERATIVE REPORT   PREOPERATIVE DIAGNOSIS:  Dysfunctional uterine bleeding.   POSTOPERATIVE DIAGNOSIS:  Dysfunctional uterine bleeding.   PROCEDURE:  1. Diagnostic, therapeutic dilatation and curettage.  2. Hysteroscopy.   SURGEON:  Roseanna Rainbow, MD   ANESTHESIA:  Managed anesthesia care, paracervical block.   PATHOLOGY:  Endometrial curettings.   ESTIMATED BLOOD LOSS:  Minimal.   COMPLICATIONS:  None.   PROCEDURES:  The patient was taken to the operating room with an IV  running.  She was then placed in the dorsal lithotomy position and  prepped and draped in the usual sterile fashion.  After a time-out had  been completed, a bivalve speculum was placed in the patient's vagina.  The anterior lip of the cervix was infiltrated with 2 mL of 1%  lidocaine.  The anterior lip of cervix was then grasped with a single-  tooth tenaculum.  A 4 mL of 1% lidocaine were then infiltrated at 4 and  7o'clock to produce a paracervical block.  The cervix was then dilated  with [facility] dilators.  The hysteroscope was then introduced into the  cervix and advanced to the uterine fundus.  No discrete lesions were  noted.  The hysteroscope was then removed.  The uterus sounded to 9 cm.  A sharp curettage was then performed.  Moderate curettings were  retrieved.  The single-tooth tenaculum was then removed with minimal  bleeding noted from the cervix.  At the close of the procedure, the  instrument and pack counts were said to be correct x2.  The patient was  taken to the PACU awake and in stable condition.      Roseanna Rainbow, M.D.  Electronically  Signed     LAJ/MEDQ  D:  07/21/2007  T:  07/22/2007  Job:  [id number]

## 2010-06-09 NOTE — Op Note (Signed)
NAMESAYLAH, [name]             ACCOUNT NO.:  [id number]   MEDICAL RECORD NO.:  [id number]          PATIENT TYPE:  AMB   LOCATION:  ENDO                         FACILITY:  [facility]   PHYSICIAN:  Georgiana Spinner, M.D.    DATE OF BIRTH:  [birth year]   DATE OF PROCEDURE:  12/06/2003  DATE OF DISCHARGE:                                 OPERATIVE REPORT   PROCEDURE:  Upper endoscopy with biopsy.   ENDOSCOPIST:  Georgiana Spinner, M.D.   INDICATIONS FOR PROCEDURE:  Abdominal pain.   ANESTHESIA:  Demerol 70, Versed 8 mg.   PROCEDURE:  With the patient mildly sedated in the left lateral decubitus  position, the Olympus videoscopic endoscope was inserted in the mouth and  passed under direct vision through the esophagus which appeared normal.  There was no evidence of Barrett's.  We entered into the stomach.  The  fundus, body appeared normal.  The antrum showed a patch of fairly intense  erythema which was somewhat irregular in shape and appears to ooze a little  bit of blood from it.  But, it appeared just to be on the mucosa.  So, we  biopsied this area.  We then entered into the duodenal bulb, second portion  of the duodenum both of which appeared normal.  From this point, the  endoscope was slowly withdrawn taking circumflex views of the duodenal  mucosa.  The endoscope was pulled back into the stomach, placed in  retroflexion we viewed the stomach from below.  The endoscope was then  straightened and withdrawn taking circumflex views of the remaining gastric  and esophageal mucosa.  The patient's vital signs and pulse remained stable.  The patient tolerated the procedure well without apparent complications.   FINDINGS:  Erythematous change of antrum, await biopsy report.  The patient  will call me for results and follow up with me as an outpatient.      GMO/MEDQ  D:  12/06/2003  T:  12/06/2003  Job:  [id number]

## 2010-06-09 NOTE — Procedures (Signed)
NAME:  [name], [name]             ACCOUNT NO.:  [id number]   MEDICAL RECORD NO.:  [id number]          PATIENT TYPE:  OUT   LOCATION:  SLEEP CENTER                 FACILITY:  [facility]   PHYSICIAN:  Clinton D. Maple Hudson, MD, FCCP, FACPDATE OF BIRTH:  Jul 09, 1975   DATE OF STUDY:                            NOCTURNAL POLYSOMNOGRAM   INDICATION FOR STUDY:  Hypersomnia with sleep apnea.  Epworth sleepiness  score 21/24.   Height 5 feet 6 inches.  Weight 234 pounds.  Neck size 17.5 inches.   HOME MEDICATIONS:  Listed and reviewed.   SLEEP ARCHITECTURE:  Total sleep time 409 minutes with sleep efficiency  91%.  Stage I was 1%, stage I 64%, stages III and IV 11, REM 23% of  total sleep time.  Sleep latency 32 minutes.  REM latency 93 minutes.  Awake after sleep onset 11 minutes.  Arousal index 8.4.  Atenolol was  taken at 9:10 p.m.   RESPIRATORY DATA:  Apnea-hypopnea index (AHI, RDI) 7.6 obstructive  events per hour indicating mild obstructive sleep apnea/hypopnea  syndrome.  There were 9 obstructive apneas and 43 hypopneas.  Events  were not positional.  REM AHI 17.8 per hour.  There were insufficient  events to permit CPAP titration by split protocol on this study night.   OXYGEN DATA:  Mild to moderate intermittent snoring with oxygen  saturation to a nadir of 83%.  Mean oxygen saturation through the study  was 93%  on room air.   CARDIAC DATA:  Normal sinus rhythm.   MOVEMENT/PARASOMNIA:  A total of 31 limb jerks were recorded, of which  10 were associated with arousal or awakening, for a period limb movement  with arousal index of 1.5 per hour, which is of doubtful significance.   IMPRESSION/RECOMMENDATION:  1. Mild obstructive sleep apnea/hypopnea syndrome, apnea-hypopnea      index 7.6 per hour (normal range 0-5 per hour).  Non-positional      events with mild to moderate snoring and oxygen desaturation to a      nadir of 83%.  2. CPAP is not usually required for management of  scores in this      range.  Weight loss, treatment for nasal      congestion and encouragement to sleep off flat of back may be      sufficient.  3. Minimal periodic limb movement with arousal, 1.5 per hour.      Clinton D. Maple Hudson, MD, [facility], FACP  Diplomate, Biomedical engineer of Sleep Medicine  Electronically Signed     CDY/MEDQ  D:  02/24/2006 12:26:04  T:  02/24/2006 21:12:18  Job:  [id number]

## 2010-06-09 NOTE — H&P (Signed)
[name], DICKERMAN NO.:  [id number]   MEDICAL RECORD NO.:  [id number]          PATIENT TYPE:  IPS   LOCATION:  0303                          FACILITY:  [facility]   PHYSICIAN:  Geoffery Lyons, M.D.      DATE OF BIRTH:  1975/12/20   DATE OF ADMISSION:  03/08/2004  DATE OF DISCHARGE:                         PSYCHIATRIC ADMISSION ASSESSMENT   IDENTIFYING INFORMATION:  A 35 year old divorced white female voluntarily  admitted on 03/08/04.   HISTORY OF PRESENT ILLNESS:  The patient presents with a history of  depression, suicidal thoughts with plan to take over-the-counter herbal  medicine.  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  Patient  reports positive inner dialog with hearing derogatory comments about  herself.  She has been noncompliant with her medications for the past weeks  because she has been pregnant with a miscarriage this past weekend.  Patient  states that she has a history of six miscarriages and she reports that her  fiance broke up me after she lost this current pregnancy.  She states that  she began drinking when she started to notice some vaginal bleeding.  She  states, otherwise, she normally does not drink.  She sleeps well.  Her  appetite has been satisfactory.  Other stressors are that she has an 8-year-  old son with Marfan's syndrome.   PAST PSYCHIATRIC HISTORY:  Third visit to [facility].  She was  here in 2001, which was her last visit.  She sees Dr. Sharl Ma as an outpatient.  She was hospitalized at [facility] for a suicide attempt where she  tried to cut her wrists.  She also has a history of overdosing on Xanax  before.   SOCIAL HISTORY:  She is a 35 year old divorced white female.  She has been  married 3 times.  She has an 38-year-old son with Marfan's syndrome.  Child  is currently with her parents.  Patient also lives with them.   She has 2  years of college.  She works as a Associate Professor at [employer].  No  legal charges.  Again, she reports a history of six miscarriages.   FAMILY HISTORY:  None.   ALCOHOL OR DRUG HISTORY:  Nonsmoker.  Some recent drinking, otherwise,  states she does not drink. Denies any drug use.   PRIMARY CARE PHYSICIAN:  Dr. Ricki Miller, who is her primary care Zane Pellecchia.  Dr.  Ilene Qua is her OB/GYN.   MEDICAL PROBLEMS:  A miscarriage on 03/05/04.  Hypothyroidism.   MEDICATIONS:  1.  Wellbutrin XL 150 mg taking 3 daily.  2.  Lamictal 150 mg daily.  3.  Klonopin 1 mg q.i.d.  4.  Synthroid 0.88 mg daily.  She has been off her medications for 3 weeks.   DRUG ALLERGIES:  CEPHALEXIN.   PHYSICAL EXAMINATION:  Done.  Temperature 98.3, heart rate 90, respirations  20, blood pressure 133/66.  Patient is 5 feet 6 inches tall, weight 266  pounds.  This is  an overweight young female in no acute distress.  Negative  lymphadenopathy.  Chest is clear.  Breast exam is deferred.  Heart rate is  regular rate and rhythm.  Abdomen is a soft, obese, nontender abdomen.  GU  exam was deferred.  Patient has denied any vaginal bleeding, reports  occasional spotting.  Extremities have no edema, no lacerations, no clubbing  or deformities.  Strong and 5+ against resistance.  Skin is warm and dry.  Neurologic findings are intact and nonfocal.   REVIEW OF SYSTEMS:  No shortness of breath, no chest pain, nonsmoker, no  history of insomnia or fevers, no weight loss. She reports a history of six  miscarriages.   DIAGNOSTIC STUDIES:  CBC is within normal limits.  Potassium is 3.2.  Pregnancy test is negative.  TSH is 1.250.   MENTAL STATUS EXAM:  Alert, cooperative female.  Little eye contact.  Head  is down mostly throughout the interview.  Speech is slow and soft spoken.  Patient feels depressed.  Her affect is constricted.  Thought process,  patient endorsing positive auditory hallucinations with some  excessive  behaviors.  Cognitive function is intact.  Memory is good.  Judgment seems  to be poor at this time.  Insight is limited.   IMPRESSION:   AXIS I:  Rule out bipolar disorder.   AXIS II:  Deferred.   AXIS III:  1.  Hypothyroidism.  2.  Miscarriage.   AXIS IV:  Problems with primary support group, other psychosocial problems.   AXIS V:  Current is 30, estimated this past year is 57.   PLAN:  Admission for suicidal ideation, psychotic symptoms, contract for  safety.  We will resume her Wellbutrin at 150, add Risperdal for her  psychosis, self-hating thinking, obsessing about her situation.  Patient may  need some individual therapy.  Patient will follow up with Dr. Sharl Ma, will  consider the IOP program.  Tentative length of stay is 4-6 days depending in  patient response to medication.      JO/MEDQ  D:  03/12/2004  T:  03/12/2004  Job:  [id number]

## 2010-06-09 NOTE — Discharge Summary (Signed)
NAME:  [name], [name]                       ACCOUNT NO.:  [id number]   MEDICAL RECORD NO.:  [id number]                   PATIENT TYPE:  INP   LOCATION:  5737                                 FACILITY:  [facility]   PHYSICIAN:  Candy Sledge, M.D.            DATE OF BIRTH:  12/31/1975   DATE OF ADMISSION:  10/21/2001  DATE OF DISCHARGE:  10/23/2001                                 DISCHARGE SUMMARY   HISTORY OF PRESENT ILLNESS:  The patient is a 35 year old right-handed  married female who developed a severe headache 3 days prior to admission  which persisted.  Headache was localized to the occipital and frontotemporal  region with soreness in the neck.  The patient described photophobia and  nausea.  This headache has been unresponsive to over-the-counter  medications.  She has complained of stiffness of her neck.  She came  to the  emergency room and underwent a CT scan of the head which was reportedly  negative.  She had a lumbar puncture under fluoroscopy requiring two  attempts.  Spinal fluid showed 1065 rbc's in tube 2 and 990 rbc's in tube 4.  Protein was 48, glucose 56.   The patient states that she has had headaches in the past.  This is about  the worst headache she has ever had, previous headaches were described as  the tension type, she has had some migraines associated with sinus  problems.   PAST MEDICAL HISTORY:  Obesity, ADHD, hypothyroidism diagnosed in a workup  for amenorrhea.  She is status post excision of a lipoma in the upper back.   MEDICATIONS ON ADMISSION:  1. Synthroid 0.5 mg p.o. q.d.  2. Strattera 100 mg p.o. q.d.   ALLERGIES:  There are no known drug allergies.   FAMILY HISTORY:  Family history was positive pertinent in that a cousin died  of subarachnoid hemorrhage within the past year.   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  She does smoke or use alcohol.   PHYSICAL AND  NEUROLOGICAL EXAMINATION:  On admission examination, the  patient presented in moderate distress.  Blood pressure 111/77,  pulse 73,  respirations 18, temperature 98.5.  The neck was uncomfortable to flexion as  well as side-to-side rotation.  General examination was otherwise  unremarkable.  On neurologic examination, she was alert and oriented and her  speech was normal.  Affect was flat and mood somewhat depressed.  Cranial  nerve examination including funduscopic was negative.  She had 5/5 strength  throughout.  Deep tendon reflexes were 1 to 2+ and symmetric and plantar  responses were downgoing bilaterally.  Sensory examination revealed  decreased pinprick in the right arm.  Cerebellar function was good.   IMPRESSION:  The patient was admitted with impression of intractable  headache with traumatic lumbar puncture versus subarachnoid hemorrhage.   PLAN:  Obtain  an MRI with MRA and the patient will receive symptomatic  treatment for her headache.   LABORATORY FINDINGS:  PT was 15 seconds with an INR of 1.2 and  PTT was 32.  CT scan of the head on October 21, 2001 showed the ventricular system to  be normal in size without shift, there is no hemorrhage or abnormal low  density.  Official report of her MRI is not on the chart at this time.   COURSE IN THE HOSPITAL:  The patient was admitted to the neurosciences unit.  She underwent MRI and MRA on October 21, 2001 showing a possible 2-3 mm  aneurysm involving the left ophthalmic artery along with thickened enhancing  meninges.  At that point, the patient's headache was in the range of 5/10  again frontal and occipital.  The patient's neck remained sore to forward  flexion and rotation.  A cerebral arteriogram was ordered to more  definitively investigate a possible aneurysm.  This was undertaken on  October 22, 2001 and showed no angiographic evidence of an intracranial  aneurysm, dissection, occlusion or stenosis.   The patient  tolerated the angiography well and on October 23, 2001 her  headache was rated at 3/10.  She did complain of a sore throat.  Her affect  remained somewhat flat and she was sore to palpation in the occipital  region.  Since there was no evidence of an aneurysm, it is felt that the  patient's headache was most likely of the tension type.  It is felt that she  had arrived at maximum hospital benefit and was discharged to home.   FINAL DIAGNOSES:  1. Tension headache.  2. History of hypothyroidism.  3. History of attention-deficit hyperactivity disorder.   DISCHARGE MEDICATIONS:  Flexeril 10 mg one half to one tablet p.o. t.i.d. as  needed for muscle relaxation, Tylox one every 4-6 hours as needed for pain,  Synthroid 50 mcg one per day, and Strattera 100 mg p.o. q.d.   DISPOSITION:  The patient is discharged to home with instructions to follow  up with Dr. Noreene Filbert as needed.  She will return to work on October 27, 2001.   CONDITION ON DISCHARGE:  Improved.   PROGNOSIS:  Good.                                               Candy Sledge, M.D.    JJS/MEDQ  D:  12/04/2001  T:  12/04/2001  Job:  [id number]   cc:   [facility]

## 2010-06-09 NOTE — Op Note (Signed)
[name], Amanda             ACCOUNT NO.:  [id number]   MEDICAL RECORD NO.:  [id number]          PATIENT TYPE:  INP   LOCATION:  9130                          [facility]ACILITY[facility]  [facility]   PHYSICIAN:  Randye Lobo, M.D.   DATE OF BIRTH:  05-08-1975   DATE OF PROCEDURE:  04/04/2005  DATE OF DISCHARGE:                                 OPERATIVE REPORT   FINDINGS/DELIVERY NOTE   PREOPERATIVE DIAGNOSIS:  1.  Intrauterine gestation at 39+ 4 weeks.  2.  History of prior cesarean section, VBAC candidate.  3.  Fetal bradycardia.   POSTOPERATIVE DIAGNOSIS:  1.  Intrauterine gestation at 39 +4 weeks.  2.  History of prior cesarean section, VBAC candidate.  3.  Fetal bradycardia.   PROCEDURE:  Is a vacuum-assisted vaginal delivery.   SURGEON:  Conley Simmonds, M.D.   ANESTHESIA:  Is local 1% lidocaine.   ESTIMATED BLOOD LOSS:  350 mL   COMPLICATIONS:  None.   INDICATIONS FOR PROCEDURE:  The patient is a 35 year old para 1 Caucasian  female who presented at 20 +4 weeks' gestation for induction of labor with  Dr. Tamela Oddi. The patient had a history of a prior cesarean section and  was admitted for a Foley balloon induction. While I was on labor and  delivery, I was called to attend to the patient due to fetal bradycardia  while Dr. Tamela Oddi was in route. When I arrived in the room the fetal  heart rate was between the 60s and 70s. There was a fetal scalp clip on. The  patient was completely dilated. The vertex was noted to be at 2+ station in  the occiput anterior position. Due to the persistent nature of the fetal  bradycardia I made a recommendation for a vacuum-assisted vaginal delivery.   FINDINGS:  A viable female was delivered at 1425 with Apgars of 9 at one  minute and 9 at five minutes. There was a nuchal cord x1 which was reduced.  The delivery was without difficulty. There was spontaneous delivery of the  placenta after cord blood was collected. At this point I examined  the  patient and noticed a midline laceration which looked like a second-degree  laceration. At this time Dr. Tamela Oddi arrived to complete the repair.  There were no complications to the baby or the mother for the vacuum  assisted vaginal delivery.      Randye Lobo, M.D.  Electronically Signed     BES/MEDQ  D:  04/04/2005  T:  04/05/2005  Job:  [id number]

## 2010-06-09 NOTE — Op Note (Signed)
NAMEGENEAL, HUEBERT             ACCOUNT NO.:  [id number]   MEDICAL RECORD NO.:  [id number]          PATIENT TYPE:  AMB   LOCATION:  ENDO                         FACILITY:  [facility]   PHYSICIAN:  Georgiana Spinner, M.D.    DATE OF BIRTH:  [birth year]   DATE OF PROCEDURE:  10/16/2005  DATE OF DISCHARGE:                                 OPERATIVE REPORT   PROCEDURE:  Upper endoscopy with biopsy.   INDICATIONS:  GERD.   ANESTHESIA:  Demerol 50, Versed 7.5 mg.   PROCEDURE:  With the patient mildly sedated in the left lateral decubitus  position the Olympus videoscopic endoscope was inserted in the mouth and  passed under direct vision through the esophagus which appeared normal. I  really did not see any clear-cut evidence of Barrett's esophagus but I  photographed this area and took biopsies around the circumference of the  squamocolumnar junction, entered into the stomach. Fundus, body, antrum,  duodenal bulb, second portion duodenum were visualized. From this point the  endoscope was slowly withdrawn taking circumferential views of duodenal  mucosa until the endoscope had been pulled back into the stomach and placed  in retroflexion to view the stomach from below. The endoscope was  straightened and withdrawn taking circumferential views of remaining gastric  and esophageal mucosa.  The patient's vital signs, pulse oximeter remained  stable.  The patient tolerated procedure well without apparent  complications.   FINDINGS:  Unremarkable examination with biopsies taken of the  squamocolumnar junction.   PLAN:  Await biopsy report.  The patient will call me for results and follow-  up with me as an outpatient.           ______________________________  Georgiana Spinner, M.D.     GMO/MEDQ  D:  10/16/2005  T:  10/17/2005  Job:  [id number]

## 2010-06-09 NOTE — Op Note (Signed)
   NAME:  [name], [name]                       ACCOUNT NO.:  [id number]   MEDICAL RECORD NO.:  [id number]                   PATIENT TYPE:  MAT   LOCATION:  MATC                                 [facility]ILITY[facility]   PHYSICIAN:  James A. Ashley Royalty, M.D.             DATE OF BIRTH:  [birth year]   DATE OF PROCEDURE:  09/26/2001  DATE OF DISCHARGE:                                 OPERATIVE REPORT   PREOPERATIVE DIAGNOSIS:  Inevitable abortion at seven weeks' gestation.   POSTOPERATIVE DIAGNOSIS:  Inevitable abortion at seven weeks' gestation.  Pathology pending.   PROCEDURE:  Suction, dilatation and curettage.   SURGEON:  Rudy Jew. Ashley Royalty, M.D.   ANESTHESIA:  Monitored anesthesia care with 1% Xylocaine paracervical block  (20 cc).   ESTIMATED BLOOD LOSS:  50 cc.   COMPLICATIONS:  None.   PACKS AND DRAINS:  None.   DESCRIPTION OF PROCEDURE:  The patient was taken to the operating room and  placed in the dorsal supine position.  After adequate IV sedation was  administered, she was prepped and draped in the usual manner for vaginal  surgery.  A posterior weighted retractor was placed per vagina.  The  anterior lip of the cervix was grasped with a single-tooth tenaculum.  The  uterus was gently sounded to approximately 8 cm.  The cervix was serially  dilated to a size 25 [place] using [employer] dilators.  An 8 mm suction curet was  introduced into the uterine cavity and suction applied.  A moderate amount  of repair of products of conception was delivered through the tubing.  After  several passes with the suction curet, no additional tissue was obtained.  At this point, the vaginal instruments were removed.  Hemostasis was noted,  and the procedure was terminated.   The patient was taken to the recovery room in excellent condition.  At this  time of this dictation, the type an Rh is pending.  The patient will be  given RhoGAM should the Rh return as negative.                        James A. Ashley Royalty, M.D.    JAM/MEDQ  D:  09/26/2001  T:  09/26/2001  Job:  [id number]   cc:   Georgina Peer, M.D.  [address]  [place]  [place] [address]  Fax: [phone]

## 2010-06-09 NOTE — Discharge Summary (Signed)
NAMELAURENE, [name]             ACCOUNT NO.:  [id number]   MEDICAL RECORD NO.:  [id number]          PATIENT TYPE:  INP   LOCATION:  9309                          [facility]   PHYSICIAN:  Roseanna Rainbow, M.D.DATE OF BIRTH:  1975/03/21   DATE OF ADMISSION:  12/25/2004  DATE OF DISCHARGE:  12/28/2004                                 DISCHARGE SUMMARY   CHIEF COMPLAINT:  The patient is a 35 year old gravida 8, para 1 with an  estimated date of confinement of April 07, 2005 with multiple complaints  including nausea,vomiting, cough and earache.   PAST SURGICAL HISTORY:  She has had an appendectomy, a LEEP and D&E.   PAST MEDICAL HISTORY:  Migraine headaches, hypothyroidism.   MEDICATIONS:  1.  Synthroid.  2.  Prenatal vitamins.  3.  Meclizine.  4.  Acyclovir.   ALLERGIES:  KEFLEX.   SOCIAL HISTORY:  She is divorced.   PHYSICAL EXAMINATION:  VITAL SIGNS:  Temperature 97.5, pulse 90, respiratory  rate 20. Blood pressure 114/70.  ABDOMEN:  Gravid, nontender, external fetal monitor showing fetal heart rate  150's, reassuring.   LABORATORY DATA:  White blood cell count 19,200, hemoglobin 13, platelet  count 204,000. Urinalysis with specific gravity 1.025 with 40 ketones.   ASSESSMENT/PLAN:  Intrauterine pregnancy at 25+ weeks with viral syndrome.  Plan for admission and supportive care.   HOSPITAL COURSE:  The patient was admitted and given antiemetics and  intravenous hydration.  The nausea and vomiting resolved.  She, however,  continued to have upper respiratory tract infection symptoms.  An abdominal  ultrasound was normal.  She was discharged to home on hospital day #3,  tolerating a regular diet.   DISCHARGE DIAGNOSES:  1.  Intrauterine pregnancy at 25+ weeks.  2.  Viral syndrome.   CONDITION ON DISCHARGE:  Stable.   DIET:  Regular.   ACTIVITY:  Ad lib.   MEDICATIONS:  Resume home medications.   DISPOSITION:  The patient was to keep her previously  scheduled appointment  in the office.      Roseanna Rainbow, M.D.  Electronically Signed     LAJ/MEDQ  D:  01/26/2005  T:  01/26/2005  Job:  [id number]

## 2010-06-09 NOTE — Discharge Summary (Signed)
[name], ARCHAMBEAU NO.:  [id number]   MEDICAL RECORD NO.:  [id number]          PATIENT TYPE:  IPS   LOCATION:  0303                          [facility]   PHYSICIAN:  Carolanne Grumbling, M.D.    DATE OF BIRTH:  06-26-1975   DATE OF ADMISSION:  03/08/2004  DATE OF DISCHARGE:  03/13/2004                                 DISCHARGE SUMMARY   IDENTIFICATION:  [name] was a 35 year old female.   INITIAL ASSESSMENT AND DIAGNOSIS:  [name] was admitted to the hospital  because of depression with suicidal thoughts and a plan to take over-the-  counter medicine.  She said she had a inner dialogue hearing derogatory  comments about herself.  She had been noncompliant with her medicines for  the past few weeks because she had been pregnant but had had a miscarriage  the weekend prior to admission.  She said she had a history of six  miscarriages and she was about to break up with her fiance, who wanted to  have a baby.  After this miscarriage, she said she began drinking even  though she normally does not drink.   MENTAL STATUS EXAM:  Mental status, at the time of the initial evaluation,  revealed an alert, oriented woman, who was cooperative.  She made little eye  contact.  Speech was slow and soft-spoken.  She felt depressed.  Her affect  was constricted.  She expressed thoughts similar to auditory hallucinations.  Cognitive function seemed intact.  Memory was good.  Judgment seemed poor.  Insight was limited.   ADMISSION DIAGNOSES:   AXIS I:  Rule out bipolar disorder.   AXIS II:  Deferred.   AXIS III:  1.  Hypothyroidism.  2.  Post miscarriage.   AXIS IV:  Moderate to severe.   AXIS V:  30/65.   FINDINGS:  All indicated laboratory examinations were within normal limits  or noncontributory including a thyroid function test.   HOSPITAL COURSE:  While in the hospital, [name] was cooperative.  She  continued to talk some suicidal ideation in a vague  way in the first day or  so but, after that, seemed to be much better.  By the time of discharge, she  decided she would probably separate from her fiance, focus on finding  another job and being happy with the son she had and she said she planned  not to try to get pregnant again.  If she ever decided to have another  child, it would be through a surrogate mother.  Consequently, she felt much  relief and was looking forward to being discharged.  At the time of  discharge, she denied any suicidal thoughts.   FINAL DIAGNOSES:   AXIS I:  Mood disorder not otherwise specified.   AXIS II:  Deferred.   AXIS III:  1.  Hypothyroidism.  2.  Post miscarriage.   AXIS IV:  Moderate.   AXIS V:  50/65.   DISCHARGE MEDICATIONS:  1.  Levothyroxine 88 mcg daily.  2.  Wellbutrin XL 150 mg daily.  3.  Risperdal 0.25 mg  in the morning and 0.5 mg at bedtime.  4.  Clonazepam 1 mg four times a day as needed.  5.  Ambien 10 mg at bedtime as needed.   ACTIVITY/DIET:  There were no restrictions placed on her activity or her  diet.   FOLLOW UP:  She was to see Dr. Evelene Croon on the 24th of February and Page Katrinka Blazing  on the 23rd of February.      GT/MEDQ  D:  04/17/2004  T:  04/17/2004  Job:  [id number]

## 2010-06-09 NOTE — Op Note (Signed)
   NAME:  [name], [name]                       ACCOUNT NO.:  [id number]   MEDICAL RECORD NO.:  [id number]                   PATIENT TYPE:  OUT   LOCATION:  XRAY                                 [facility]ACILITY:  [facility]   PHYSICIAN:  Yaakov Guthrie. Shon Hough, M.D.           DATE OF BIRTH:  1975/11/05   DATE OF PROCEDURE:  DATE OF DISCHARGE:                                 OPERATIVE REPORT   HISTORY:  This is a 35 year old lady who has a large mass involving her back  and neck area consistent with a hibernoma with increased pain and discomfort  as well as limitation on flexion and extension of her neck secondary to the  above.   PROCEDURES PERFORMED:  Excision of the mass, liposuction assistance.   ASSISTANT:  Louisa Second, M.D.   ANESTHESIA:  General.   ASSISTANT:  Alethia Berthold, C.F.A.   PROCEDURE:  The patient was taken to the operating room and placed on the  operating room table.  In decent time, the patient was given adequate  general anesthesia and intubated orally.  She was then placed in the right  lateral decubitus position.  Prep was down to the neck back area using  Betadine and sulfa solution and walled off with sterile towels and drapes so  as to make a sterile field.  The incision was made at the lateral portion of  the mass and dissected under.  We were able to dissect down to the fascia  level and removing some septal scarring.  After this, liposuction assistance  was done to remove most of the bulk of the mass in the midportions using a  [place] catheter 28-3 and 4's out laterally and more tissue was removed using  sharp dissection.  After proper hemostasis, pressure was maintained and a  loose 5-0 nylon suture was placed in the areas.  Steri-Strips with soft  dressings were applied in all of the areas including Xeroform, 4 x 4's,  ABDs, and HyperFix tape.  She withstood the procedures very well and was  taken to recovery in excellent condition.   ESTIMATED BLOOD LOSS:   Nil.                                               Yaakov Guthrie. Shon Hough, M.D.    Cathie Hoops  D:  12/22/2001  T:  12/22/2001  Job:  [id number]

## 2010-10-19 LAB — CBC
Platelets: 170
RBC: 4.14
WBC: 7.6

## 2010-10-19 LAB — PREGNANCY, URINE: Preg Test, Ur: NEGATIVE

## 2010-10-24 LAB — WOUND CULTURE: Gram Stain: NONE SEEN

## 2010-10-31 LAB — CSF CELL COUNT WITH DIFFERENTIAL
Eosinophils, CSF: 0
Eosinophils, CSF: 0
Lymphs, CSF: 0 — ABNORMAL LOW
Lymphs, CSF: 0 — ABNORMAL LOW
Monocyte-Macrophage-Spinal Fluid: 0 — ABNORMAL LOW
Monocyte-Macrophage-Spinal Fluid: 0 — ABNORMAL LOW
RBC Count, CSF: 2 — ABNORMAL HIGH
RBC Count, CSF: 5 — ABNORMAL HIGH
Segmented Neutrophils-CSF: 0
Segmented Neutrophils-CSF: 0
Tube #: 1
Tube #: 4
WBC, CSF: 0
WBC, CSF: 0

## 2010-10-31 LAB — COMPREHENSIVE METABOLIC PANEL WITH GFR
ALT: 65 — ABNORMAL HIGH
AST: 49 — ABNORMAL HIGH
Albumin: 3.2 — ABNORMAL LOW
Alkaline Phosphatase: 65
BUN: 10
CO2: 29
Calcium: 8.3 — ABNORMAL LOW
Chloride: 102
Creatinine, Ser: 0.79
GFR calc non Af Amer: >60
Glucose, Bld: 127 — ABNORMAL HIGH
Potassium: 4.2
Sodium: 136
Total Bilirubin: 0.7
Total Protein: 6.2

## 2010-10-31 LAB — DIFFERENTIAL
Basophils Absolute: 0
Basophils Relative: 1
Eosinophils Absolute: 0 — ABNORMAL LOW
Eosinophils Relative: 0
Monocytes Absolute: 1
Monocytes Relative: 10

## 2010-10-31 LAB — GLUCOSE, CSF: Glucose, CSF: 86 — ABNORMAL HIGH

## 2010-10-31 LAB — COMPREHENSIVE METABOLIC PANEL
Albumin: 3.1 — ABNORMAL LOW
Alkaline Phosphatase: 62
BUN: 7
CO2: 29
Chloride: 102
GFR calc non Af Amer: >60
Glucose, Bld: 158 — ABNORMAL HIGH
Potassium: 4
Total Bilirubin: 0.8

## 2010-10-31 LAB — CBC
HCT: 39.6
HCT: 41.8
Hemoglobin: 13.6
Hemoglobin: 14.7
MCHC: 35.1
MCV: 92.7
Platelets: 196
Platelets: 200
RBC: 4.51
RDW: 13.8
RDW: 14.1
WBC: 11 — ABNORMAL HIGH
WBC: 9.8

## 2010-10-31 LAB — AMMONIA: Ammonia: 29

## 2010-10-31 LAB — VALPROIC ACID LEVEL: Valproic Acid Lvl: 98.1

## 2010-10-31 LAB — PROTEIN, CSF: Total  Protein, CSF: 32

## 2010-12-05 ENCOUNTER — Encounter (HOSPITAL_COMMUNITY): Payer: Self-pay | Admitting: *Deleted

## 2010-12-05 ENCOUNTER — Emergency Department (INDEPENDENT_AMBULATORY_CARE_PROVIDER_SITE_OTHER): Payer: No Typology Code available for payment source

## 2010-12-05 ENCOUNTER — Emergency Department (INDEPENDENT_AMBULATORY_CARE_PROVIDER_SITE_OTHER)
Admission: EM | Admit: 2010-12-05 | Discharge: 2010-12-05 | Disposition: A | Discharge status: Hospice | Payer: Self-pay | Source: Home / Self Care | Attending: Family Medicine | Admitting: Family Medicine

## 2010-12-05 DIAGNOSIS — J069 Acute upper respiratory infection, unspecified: Secondary | ICD-10-CM

## 2010-12-05 HISTORY — DX: Hypothyroidism, unspecified: E03.9

## 2010-12-05 MED ORDER — AZITHROMYCIN 250 MG PO TABS: ORAL_TABLET | ORAL | Status: AC

## 2010-12-05 NOTE — ED Provider Notes (Signed)
History     CSN: [id number] Arrival date & time: 12/05/2010  3:26 PM   First MD Initiated Contact with Patient 12/05/10 1613      Chief Complaint  Patient presents with  . Cough    (Consider location/radiation/quality/duration/timing/severity/associated sxs/prior treatment) Patient is a 35 y.o. female presenting with cough. The history is provided by the patient.  Cough This is a new problem. The current episode started more than 1 week ago (sinus and chest congestion). The problem occurs constantly. The problem has been gradually worsening. The cough is non-productive. Associated symptoms include rhinorrhea.    Past Medical History  Diagnosis Date  . Diabetes mellitus   . Migraine   . Hypoactive thyroid     No past surgical history on file.  Family History  Problem Relation Age of Onset  . Hypertension Mother   . Glaucoma Mother   . Hypertension Father     History  Substance Use Topics  . Smoking status: Never Smoker   . Smokeless tobacco: Not on file  . Alcohol Use: No    OB History    Grav Para Term Preterm Abortions TAB SAB Ect Mult Living                  Review of Systems  Constitutional: Negative.   HENT: Positive for congestion, rhinorrhea and postnasal drip.   Eyes: Negative.   Respiratory: Positive for cough and chest tightness.   Cardiovascular: Negative.   Gastrointestinal: Negative.     Allergies  Cephalexin and Keflex  Home Medications   Current Outpatient Rx  Name Route Sig Dispense Refill  . ACYCLOVIR 200 MG PO CAPS Oral Take 200 mg by mouth 2 (two) times daily.      . ATENOLOL 50 MG PO TABS Oral Take 50 mg by mouth daily.      . INSULIN GLARGINE 100 UNIT/ML [place] SOLN Subcutaneous Inject 25-30 Units into the skin daily.      Marland Kitchen LEVOTHYROXINE SODIUM 112 MCG PO TABS Oral Take 112 mcg by mouth daily.      Marland Kitchen METFORMIN HCL 500 MG PO TABS Oral Take 500 mg by mouth 2 (two) times daily with a meal.        BP 133/63  Pulse 93  Temp(Src)  98.2 F (36.8 C) (Oral)  Resp 20  SpO2 100%  LMP 10/01/2010  Physical Exam  Constitutional: She appears well-developed and well-nourished.  HENT:  Head: Normocephalic and atraumatic.  Eyes: Conjunctivae and EOM are normal. Pupils are equal, round, and reactive to light.  Neck: Normal range of motion. Neck supple.  Cardiovascular: Normal rate, regular rhythm, normal heart sounds and intact distal pulses.   Pulmonary/Chest: Effort normal and breath sounds normal. She exhibits tenderness.  Abdominal: Soft. Bowel sounds are normal.  Lymphadenopathy:    She has no cervical adenopathy.  Skin: Skin is warm and dry.    ED Course  Procedures (including critical care time)  Labs Reviewed - No data to display Dg Chest 2 View  12/05/2010  *RADIOLOGY REPORT*  Clinical Data: Cough  CHEST - 2 VIEW  Comparison: 05/24/2010  Findings: Heart size is normal.  No pleural effusion or pulmonary edema.  No airspace consolidation identified.  Review of the visualized osseous structures are negative.  IMPRESSION:  1.  No active cardiopulmonary abnormalities.  Original Report Authenticated By: Rosealee Albee, M.D.     No diagnosis found.    MDM  X-rays reviewed and report per radiologist.  Barkley Bruns, MD 12/05/10 [phone]

## 2010-12-05 NOTE — ED Notes (Signed)
Pt  Reports  Symptoms  Of  Cough  /  Congestion   /  Sinus  Congestion and     Back pain     Symptoms  X  3  Weeks  The  Cough is  Non productive      And she  Has  Discomfort in chest  From  coughing

## 2011-01-23 DIAGNOSIS — Z9289 Personal history of other medical treatment: Secondary | ICD-10-CM

## 2011-01-23 DIAGNOSIS — G4730 Sleep apnea, unspecified: Secondary | ICD-10-CM

## 2011-01-23 HISTORY — DX: Sleep apnea, unspecified: G47.30

## 2011-01-23 HISTORY — DX: Personal history of other medical treatment: Z92.89

## 2011-05-01 ENCOUNTER — Emergency Department (HOSPITAL_COMMUNITY)
Admission: EM | Admit: 2011-05-01 | Discharge: 2011-05-01 | Disposition: A | Discharge status: Home / Self Care | Payer: Self-pay | Attending: Emergency Medicine | Admitting: Emergency Medicine

## 2011-05-01 ENCOUNTER — Emergency Department (HOSPITAL_COMMUNITY): Payer: Self-pay

## 2011-05-01 ENCOUNTER — Encounter (HOSPITAL_COMMUNITY): Payer: Self-pay

## 2011-05-01 DIAGNOSIS — Z79899 Other long term (current) drug therapy: Secondary | ICD-10-CM | POA: Insufficient documentation

## 2011-05-01 DIAGNOSIS — K469 Unspecified abdominal hernia without obstruction or gangrene: Secondary | ICD-10-CM | POA: Insufficient documentation

## 2011-05-01 DIAGNOSIS — E119 Type 2 diabetes mellitus without complications: Secondary | ICD-10-CM | POA: Insufficient documentation

## 2011-05-01 LAB — COMPREHENSIVE METABOLIC PANEL
ALT: 44 U/L — ABNORMAL HIGH (ref 0–35)
Calcium: 9.4 mg/dL (ref 8.4–10.5)
Creatinine, Ser: 0.84 mg/dL (ref 0.50–1.10)
GFR calc Af Amer: >90 mL/min (ref >90)
GFR calc non Af Amer: 88 mL/min — ABNORMAL LOW (ref >90)
Glucose, Bld: 152 mg/dL — ABNORMAL HIGH (ref 70–99)
Sodium: 136 mEq/L (ref 135–145)
Total Protein: 7.5 g/dL (ref 6.0–8.3)

## 2011-05-01 LAB — CBC
HCT: 41.9 % (ref 36.0–46.0)
MCH: 29.7 pg (ref 26.0–34.0)
MCV: 92.1 fL (ref 78.0–100.0)
Platelets: 256 10*3/uL (ref 150–400)
RDW: 13.9 % (ref 11.5–15.5)

## 2011-05-01 LAB — URINALYSIS, ROUTINE W REFLEX MICROSCOPIC
Bilirubin Urine: NEGATIVE
Leukocytes, UA: NEGATIVE
Nitrite: POSITIVE — AB
Specific Gravity, Urine: 1.028 (ref 1.005–1.030)
Urobilinogen, UA: 0.2 mg/dL (ref 0.0–1.0)
pH: 6 (ref 5.0–8.0)

## 2011-05-01 LAB — URINE MICROSCOPIC-ADD ON

## 2011-05-01 LAB — DIFFERENTIAL
Basophils Absolute: 0.1 10*3/uL (ref 0.0–0.1)
Eosinophils Absolute: 0.2 10*3/uL (ref 0.0–0.7)
Eosinophils Relative: 2 % (ref 0–5)
Monocytes Absolute: 0.8 10*3/uL (ref 0.1–1.0)

## 2011-05-01 LAB — PREGNANCY, URINE: Preg Test, Ur: NEGATIVE

## 2011-05-01 MED ORDER — SODIUM CHLORIDE 0.9 % IV SOLN
INTRAVENOUS | Status: DC
  Administered 2011-05-01: 20:00:00 via INTRAVENOUS

## 2011-05-01 MED ORDER — MORPHINE SULFATE 4 MG/ML IJ SOLN
4.0000 mg | Freq: Once | INTRAMUSCULAR | Status: AC
  Administered 2011-05-01: 4 mg via INTRAVENOUS
  Filled 2011-05-01: qty 1

## 2011-05-01 MED ORDER — ONDANSETRON HCL 4 MG/2ML IJ SOLN
4.0000 mg | Freq: Once | INTRAMUSCULAR | Status: AC
  Administered 2011-05-01: 4 mg via INTRAVENOUS
  Filled 2011-05-01: qty 2

## 2011-05-01 MED ORDER — PROMETHAZINE HCL 25 MG PO TABS: 25.0000 mg | ORAL_TABLET | Freq: Four times a day (QID) | ORAL | Status: DC | PRN

## 2011-05-01 MED ORDER — OXYCODONE-ACETAMINOPHEN 5-325 MG PO TABS: 2.0000 | ORAL_TABLET | ORAL | Status: AC | PRN

## 2011-05-01 MED ORDER — IOHEXOL 300 MG/ML  SOLN
100.0000 mL | Freq: Once | INTRAMUSCULAR | Status: AC | PRN
  Administered 2011-05-01: 100 mL via INTRAVENOUS

## 2011-05-01 NOTE — Discharge Instructions (Signed)
You have a hernia, but there is no signs of obstruction.  Use Phenergan for nausea and vomiting, and Percocet for pain.  Followup with the general surgeon, for reevaluation and to discuss the need for surgery.  Return for worse or uncontrolled symptoms

## 2011-05-01 NOTE — ED Notes (Signed)
Pt in from home with abd pain x3 weeks states worsening pt states hurts on palpation states n/v no relief with Zofran and phenergan

## 2011-05-01 NOTE — ED Provider Notes (Signed)
History     CSN: [id number]  Arrival date & time 05/01/11  1508   First MD Initiated Contact with Patient 05/01/11 1926      Chief Complaint  Patient presents with  . Abdominal Pain     x3 weeks    (Consider location/radiation/quality/duration/timing/severity/associated sxs/prior treatment) Patient is a 36 y.o. female presenting with abdominal pain. The history is provided by the patient.  Abdominal Pain The primary symptoms of the illness include abdominal pain, nausea and vomiting. The primary symptoms of the illness do not include fever, shortness of breath, diarrhea or dysuria.  Symptoms associated with the illness do not include chills, constipation or hematuria.   the patient is a 36 year old morbidly obese female, with a history of C-section, and appendectomy, and PCOS and biliary sludge, who presents to the emergency department complaining of 3 weeks with abdominal pain, and profuse, nausea and vomiting.  She denies diarrhea.  She denies respiratory symptoms, or urinary symptoms.  She has a history of kidney stones, as well, but denies dysuria or hematuria.  She states that the pain is increasing in severity and increases when she leans over.  She denies alcohol use or history of peptic ulcer disease.  Past Medical History  Diagnosis Date  . Diabetes mellitus   . Migraine   . Hypoactive thyroid     History reviewed. No pertinent past surgical history.  Family History  Problem Relation Age of Onset  . Hypertension Mother   . Glaucoma Mother   . Hypertension Father     History  Substance Use Topics  . Smoking status: Never Smoker   . Smokeless tobacco: Not on file  . Alcohol Use: No    OB History    Grav Para Term Preterm Abortions TAB SAB Ect Mult Living                  Review of Systems  Constitutional: Negative for fever and chills.  Respiratory: Negative for cough and shortness of breath.   Cardiovascular: Negative for chest pain.  Gastrointestinal:  Positive for nausea, vomiting and abdominal pain. Negative for diarrhea and constipation.  Genitourinary: Negative for dysuria and hematuria.  Neurological: Negative for headaches.  Psychiatric/Behavioral: Negative for confusion.  All other systems reviewed and are negative.    Allergies  Keflex and Cephalexin  Home Medications   Current Outpatient Rx  Name Route Sig Dispense Refill  . ACYCLOVIR 200 MG PO CAPS Oral Take 200 mg by mouth 2 (two) times daily.      . ATENOLOL 50 MG PO TABS Oral Take 50 mg by mouth daily.      . B COMPLEX VITAMINS PO CAPS Oral Take 1 capsule by mouth daily.    . GUAIFENESIN 100 MG/5ML PO SOLN Oral Take 20 mLs by mouth every 4 (four) hours as needed. Cold symptons    . GUANFACINE HCL ER 2 MG PO TB24 Oral Take 2 mg by mouth daily.    . INSULIN GLARGINE 100 UNIT/ML [place] SOLN Subcutaneous Inject 35 Units into the skin daily.     Marland Kitchen LEVOTHYROXINE SODIUM 112 MCG PO TABS Oral Take 112 mcg by mouth daily.      Marland Kitchen METFORMIN HCL 500 MG PO TABS Oral Take 1,000 mg by mouth 2 (two) times daily with a meal.     . PRENATAL MULTIVITAMIN CH Oral Take 1 tablet by mouth daily.      BP 137/70  Pulse 75  Temp(Src) 98 F (36.7 C) (Oral)  Resp 18  SpO2 100%  LMP 02/23/2011  Physical Exam  Vitals reviewed. Constitutional: She is oriented to person, place, and time. No distress.       Morbidly obese  HENT:  Head: Normocephalic and atraumatic.  Eyes: EOM are normal.  Neck: Normal range of motion. Neck supple.  Cardiovascular: Normal rate.   No murmur heard. Pulmonary/Chest: Effort normal. No respiratory distress.  Abdominal: Soft. There is tenderness. There is no rebound and no guarding.       Left upper quadrant and left lower quadrant tenderness, with no peritoneal sign There is no tenderness in the epigastrium or the right side of his abdomen  Musculoskeletal: Normal range of motion.  Neurological: She is alert and oriented to person, place, and time.  Skin: Skin  is warm and dry.  Psychiatric: She has a normal mood and affect. Thought content normal.    ED Course  Procedures (including critical care time) 36 year old female, with a left sided abdominal pain for [redacted] weeks along with nausea and vomiting.  No diarrhea.  No fevers.  On examination.  She is morbidly obese with left-sided tenderness.  My concern is for diverticulitis.  We will establish an IV perform laboratory testing, and a CAT scan of her abdomen for assessment.  i will give her IV analgesics, and antiemetics  Labs Reviewed  GLUCOSE, CAPILLARY - Abnormal; Notable for the following:    Glucose-Capillary 228 (*)    All other components within normal limits  CBC - Abnormal; Notable for the following:    WBC 11.2 (*)    All other components within normal limits  DIFFERENTIAL  PREGNANCY, URINE  COMPREHENSIVE METABOLIC PANEL  URINALYSIS, ROUTINE W REFLEX MICROSCOPIC   No results found.   No diagnosis found.  10:52 PM Explained findings and needed tx.  she understands the plan and agrees.  Now.  She would like another dosage of medications before we discharge her  MDM  Left-sided abdominal pain, with nausea and vomiting Abdominal hernia without signs of obstruction or incarceration        Cheri Guppy, MD 05/01/11 2253

## 2011-05-01 NOTE — ED Notes (Signed)
Pt has finished first cup of contrast and 1/2 of second cup.

## 2011-05-01 NOTE — ED Notes (Signed)
Pt requesting more nausea medication, Dr. Weldon Inches made aware, orders received.

## 2011-05-23 ENCOUNTER — Encounter (INDEPENDENT_AMBULATORY_CARE_PROVIDER_SITE_OTHER): Payer: Self-pay | Admitting: Surgery

## 2011-05-24 ENCOUNTER — Encounter (INDEPENDENT_AMBULATORY_CARE_PROVIDER_SITE_OTHER): Payer: Self-pay | Admitting: Surgery

## 2011-05-24 ENCOUNTER — Ambulatory Visit (INDEPENDENT_AMBULATORY_CARE_PROVIDER_SITE_OTHER): Payer: Medicaid Other | Admitting: Surgery

## 2011-05-24 ENCOUNTER — Encounter (INDEPENDENT_AMBULATORY_CARE_PROVIDER_SITE_OTHER): Payer: Self-pay | Admitting: General Surgery

## 2011-05-24 VITALS — BP 136/95 | HR 101 | Temp 97.5°F | Ht 65.5 in | Wt 326.8 lb

## 2011-05-24 DIAGNOSIS — K439 Ventral hernia without obstruction or gangrene: Secondary | ICD-10-CM

## 2011-05-24 NOTE — Progress Notes (Signed)
Patient ID: [name], female   DOB: 1975-02-13, 36 y.o.   MRN: [id number]  Chief Complaint  Patient presents with  . Pre-op Exam    eval ventral hernia    HPI [name] is a 36 y.o. female.  Referred by Dr. Weldon Inches from the ED with a ventral hernia OB/GYN - Dr. Tamela Oddi HPI 36 yo female with morbid obesity (BMI 54) and several other comorbidities presents after recent visit to the ED for abdominal pain.  She is two years s/p c-section via a long upper midline incision.  This area has become more firm and tender.  She experiences more pain when she bends over.  She has had some nausea, vomiting, and constipation.  She underwent a CT scan in the ED that showed a large ventral hernia containing bowel and colon.  She presents now for surgical evaluation. Past Medical History  Diagnosis Date  . Diabetes mellitus   . Migraine   . Hypoactive thyroid   . Cancer     cervical  . Osteoporosis   . Stroke     Past Surgical History  Procedure Date  . Cesarean section   . Appendectomy   . Lipoma     total 3  . Neuroplasty / transposition median nerve at carpal tunnel bilateral   . Cervical cancer     Family History  Problem Relation Age of Onset  . Hypertension Mother   . Glaucoma Mother   . Hypertension Father   . Cancer Father     precancerious polyps  . Cancer Maternal Uncle     colon    Social History History  Substance Use Topics  . Smoking status: Never Smoker   . Smokeless tobacco: Not on file  . Alcohol Use: No    Allergies  Allergen Reactions  . Cephalexin Anaphylaxis  . Cephalexin     Current Outpatient Prescriptions  Medication Sig Dispense Refill  . acyclovir (ZOVIRAX) 200 MG capsule Take 200 mg by mouth 2 (two) times daily.        Marland Kitchen atenolol-chlorthalidone (TENORETIC) 50-25 MG per tablet Take 1 tablet by mouth daily.      Marland Kitchen b complex vitamins capsule Take 1 capsule by mouth daily.      Marland Kitchen glimepiride (AMARYL) 4 MG tablet Take 4 mg by  mouth daily before breakfast.      . insulin NPH-insulin regular (NOVOLIN 70/30) (70-30) 100 UNIT/ML injection Inject 25 Units into the skin 2 (two) times daily with a meal.      . levothyroxine (SYNTHROID, LEVOTHROID) 112 MCG tablet Take 112 mcg by mouth daily.        Marland Kitchen lisinopril (PRINIVIL,ZESTRIL) 10 MG tablet Take 10 mg by mouth daily.      . metFORMIN (GLUCOPHAGE) 500 MG tablet Take 1,000 mg by mouth 2 (two) times daily with a meal.         Review of Systems Review of Systems  Constitutional: Negative for fever, chills and unexpected weight change.  HENT: Negative for hearing loss, congestion, sore throat, trouble swallowing and voice change.   Eyes: Negative for visual disturbance.  Respiratory: Positive for cough. Negative for wheezing.   Cardiovascular: Negative for chest pain, palpitations and leg swelling.  Gastrointestinal: Positive for nausea, vomiting, abdominal pain, constipation, blood in stool and anal bleeding. Negative for diarrhea and abdominal distention.  Genitourinary: Negative for hematuria, vaginal bleeding and difficulty urinating.  Musculoskeletal: Negative for arthralgias.  Skin: Negative for rash and wound.  Neurological:  Positive for weakness. Negative for seizures, syncope and headaches.  Hematological: Negative for adenopathy. Does not bruise/bleed easily.  Psychiatric/Behavioral: Negative for confusion.    Blood pressure 136/95, pulse 101, temperature 97.5 F (36.4 C), temperature source Temporal, height 5' 5.5" (1.664 m), weight 326 lb 12.8 oz (148.236 kg), last menstrual period 02/23/2011, SpO2 95.00%.  Physical Exam Physical Exam Morbidly obese female in NAD HEENT:  EOMI, sclera anicteric Neck:  No masses, no thyromegaly Lungs:  CTA bilaterally; normal respiratory effort CV:  Regular rate and rhythm; no murmurs Abd:  +bowel sounds, soft, long upper midline incision with firmness under incision.  Abdomen is softer laterally.  The hernia is partially  reducible when supine Ext:  Well-perfused; no edema Skin:  Warm, dry; no sign of jaundice  Data Reviewed CT scan -  *RADIOLOGY REPORT*  Clinical Data: 3 weeks of pain. Rule out diverticulitis.  Diabetic.  CT ABDOMEN AND PELVIS WITH CONTRAST  Technique: Multidetector CT imaging of the abdomen and pelvis was  performed following the standard protocol during bolus  administration of intravenous contrast.  Contrast: OMNIPAQUE IOHEXOL 300 MG/ML SOLN  Comparison: 04/12/2010.  Findings: Ventral hernia containing bowel without obstruction. No  extraluminal bowel inflammatory process, free fluid or free air.  Appendix not clearly delineated.  Diffuse fatty infiltration liver.  Dilated gallbladder without calcified gallstones.  Nonobstructing left lower pole renal calculi. No focal hepatic,  splenic, pancreatic, adrenal or renal lesion. No abdominal aortic  aneurysm. No bony destructive lesion.  IMPRESSION:  No bowel inflammatory process noted. Please see above.  Original Report Authenticated By: Fuller Canada, M.D.        Assessment    Large ventral hernia containing bowel Multiple medical comorbidities    Plan    Open ventral hernia repair with mesh.  The surgical procedure has been discussed with the patient.  Potential risks, benefits, alternative treatments, and expected outcomes have been explained.  All of the patient's questions at this time have been answered.  The likelihood of reaching the patient's treatment goal is good.  Her risk of recurrence and complications is elevated due to her morbid obesity.  The patient understand the proposed surgical procedure and wishes to proceed.        Vishal Sandlin K. 05/24/2011, 1:49 PM

## 2011-05-24 NOTE — Patient Instructions (Signed)
We will schedule your surgery today. 

## 2011-06-09 ENCOUNTER — Emergency Department (INDEPENDENT_AMBULATORY_CARE_PROVIDER_SITE_OTHER): Payer: Self-pay

## 2011-06-09 ENCOUNTER — Encounter (HOSPITAL_COMMUNITY): Payer: Self-pay | Admitting: Emergency Medicine

## 2011-06-09 ENCOUNTER — Emergency Department (HOSPITAL_COMMUNITY)
Admission: EM | Admit: 2011-06-09 | Discharge: 2011-06-09 | Disposition: A | Discharge status: Home / Self Care | Payer: Self-pay | Source: Home / Self Care | Attending: Emergency Medicine | Admitting: Emergency Medicine

## 2011-06-09 DIAGNOSIS — M25569 Pain in unspecified knee: Secondary | ICD-10-CM

## 2011-06-09 DIAGNOSIS — M25552 Pain in left hip: Secondary | ICD-10-CM

## 2011-06-09 DIAGNOSIS — M25559 Pain in unspecified hip: Secondary | ICD-10-CM

## 2011-06-09 DIAGNOSIS — M25562 Pain in left knee: Secondary | ICD-10-CM

## 2011-06-09 DIAGNOSIS — L408 Other psoriasis: Secondary | ICD-10-CM

## 2011-06-09 DIAGNOSIS — L409 Psoriasis, unspecified: Secondary | ICD-10-CM

## 2011-06-09 HISTORY — DX: Pure hypercholesterolemia, unspecified: E78.00

## 2011-06-09 HISTORY — DX: Essential (primary) hypertension: I10

## 2011-06-09 MED ORDER — TRAMADOL HCL 50 MG PO TABS: 50.0000 mg | ORAL_TABLET | Freq: Four times a day (QID) | ORAL | Status: AC | PRN

## 2011-06-09 MED ORDER — TRIAMCINOLONE ACETONIDE 0.1 % EX CREA: TOPICAL_CREAM | Freq: Two times a day (BID) | CUTANEOUS | Status: DC

## 2011-06-09 MED ORDER — MELOXICAM 7.5 MG PO TABS: 7.5000 mg | ORAL_TABLET | Freq: Every day | ORAL | Status: DC

## 2011-06-09 NOTE — ED Provider Notes (Signed)
History     CSN: [id number]  Arrival date & time 06/09/11  1256   First MD Initiated Contact with Patient 06/09/11 1256      Chief Complaint  Patient presents with  . Rash    (Consider location/radiation/quality/duration/timing/severity/associated sxs/prior treatment) HPI Comments: Patient presents today to urgent care complaining of ongoing left hip and leg pain that started her knee and shoots up towards her left hip and part of the buttocks patient denies any other symptoms associated with this such as urinary pressure, burning, increased frequency, no flank pain, no nausea vomiting or abdominal pain. Patient denies having sustained any recent falls or injuries.  Patient also describes that for the last several weeks she has psoriasis her elbows she has developed this generalized rash it's all over her abdomen back upper and lower extremities is itching characters a lot of red bumps some of them with scaly white material on it.  Patient is a 36 y.o. female presenting with rash. The history is provided by the patient.  Rash  This is a new problem. The problem has been gradually worsening. The problem is associated with nothing. There has been no fever. The rash is present on the torso, head, back and trunk. The pain is moderate. The pain has been constant since onset. Associated symptoms include blisters and itching. Pertinent negatives include no weeping. She has tried anti-itch cream for the symptoms. The treatment provided no relief.    Past Medical History  Diagnosis Date  . Diabetes mellitus   . Migraine   . Hypoactive thyroid   . Cancer     cervical  . Osteoporosis   . Stroke   . Hypertension   . High cholesterol     Past Surgical History  Procedure Date  . Cesarean section   . Appendectomy   . Lipoma     total 3  . Neuroplasty / transposition median nerve at carpal tunnel bilateral   . Cervical cancer     Family History  Problem Relation Age of Onset  .  Hypertension Mother   . Glaucoma Mother   . Hypertension Father   . Cancer Father     precancerious polyps  . Cancer Maternal Uncle     colon    History  Substance Use Topics  . Smoking status: Never Smoker   . Smokeless tobacco: Not on file  . Alcohol Use: No    OB History    Grav Para Term Preterm Abortions TAB SAB Ect Mult Living                  Review of Systems  Constitutional: Negative for fever, chills, appetite change and fatigue.  Musculoskeletal: Negative for myalgias and joint swelling.  Skin: Positive for itching and rash. Negative for color change and wound.    Allergies  Cephalexin and Cephalexin  Home Medications   Current Outpatient Rx  Name Route Sig Dispense Refill  . ACYCLOVIR 200 MG PO CAPS Oral Take 200 mg by mouth 2 (two) times daily.      . ATENOLOL-CHLORTHALIDONE 50-25 MG PO TABS Oral Take 1 tablet by mouth daily.    . B COMPLEX VITAMINS PO CAPS Oral Take 1 capsule by mouth daily.    Marland Kitchen GLIMEPIRIDE 4 MG PO TABS Oral Take 4 mg by mouth daily before breakfast.    . INSULIN ISOPHANE & REGULAR (70-30) 100 UNIT/ML [place] SUSP Subcutaneous Inject 25 Units into the skin 2 (two) times daily with a meal.    .  LEVOTHYROXINE SODIUM 112 MCG PO TABS Oral Take 112 mcg by mouth daily.      Marland Kitchen LISINOPRIL 10 MG PO TABS Oral Take 10 mg by mouth daily.    . MELOXICAM 7.5 MG PO TABS Oral Take 1 tablet (7.5 mg total) by mouth daily. 14 tablet 0  . METFORMIN HCL 500 MG PO TABS Oral Take 1,000 mg by mouth 2 (two) times daily with a meal.     . TRAMADOL HCL 50 MG PO TABS Oral Take 1 tablet (50 mg total) by mouth every 6 (six) hours as needed for pain. 15 tablet 0  . TRIAMCINOLONE ACETONIDE 0.1 % EX CREA Topical Apply topically 2 (two) times daily. Apply bid x 4 weeks (avoid intertrigo areas) 424 g 0    BP 115/68  Pulse 79  Temp 98.2 F (36.8 C)  Resp 22  SpO2 96%  Physical Exam  Nursing note and vitals reviewed. Constitutional: She appears well-developed.   Non-toxic appearance. She does not have a sickly appearance. She does not appear ill. No distress.  Musculoskeletal: She exhibits tenderness. She exhibits no edema.       Left hip: She exhibits decreased range of motion and tenderness. She exhibits normal strength, no bony tenderness, no swelling, no crepitus, no deformity and no laceration.       Legs: Skin: Rash noted. There is erythema.       ED Course  Procedures (including critical care time)  Labs Reviewed - No data to display Dg Hip 1 View Left  06/09/2011  *RADIOLOGY REPORT*  Clinical Data: Left hip pain  LEFT HIP - 1 VIEW:  Comparison: None.  Findings: Negative for fracture.  Joint space is normal.  SI joints are intact.  There is sclerosis of the pubis compatible with chronic stress.  IMPRESSION: Negative for fracture.  No significant degenerative changes left hip  Original Report Authenticated By: Camelia Phenes, M.D.   Dg Knee 2 Views Left  06/09/2011  *RADIOLOGY REPORT*  Clinical Data: Knee pain  LEFT KNEE - 1-2 VIEW  Comparison: None.  Findings: Negative for fracture.  Normal alignment.  Joint spaces are maintained without significant spurring.  Possible joint effusion.  IMPRESSION: No significant degenerative change.  Possible joint effusion however the patient is very large making evaluation difficult.  Original Report Authenticated By: Camelia Phenes, M.D.     1. Psoriasis   2. Hip pain, acute, left   3. Knee pain, left       MDM  Patient presents with 2 different symptoms  #1   2-3 weeks of left-sided hip and knee pain for 3 weeks- she would've a mild knee effusion no neuromuscular deficits on her lower extremities no circumferential diameter discrepancy hip maneuvers were positive for pain in her left lateral hip region. And also with knee pain on the exam. Advise patient to followup with the orthopedic doctor primary care doctor physical therapy and further orthopedic input and haven't prescribed her an  anti-inflammatory medicine  #2 patient with known psoriasis using only hydrocortisone over-the-counter with no particular treatment with generalized rash. Patient is having a significant moderate to severe psoriasis flareup   Suspect her arthralgias hip and knee could be related to her psoriasis condition which is moderate-to-severe patient have encouraged her to followup with her primary care Dr. other treatments will be necessary to control her symptoms       Jimmie Molly, MD 06/09/11 1651

## 2011-06-09 NOTE — ED Notes (Signed)
Multiple complaints.  Left leg pain , pain starting in the back of left knee and radiating up posterior left thigh to buttocks, radiating around to groin. This pain for 3 weeks.  Patient reports rash for one week.  Red bumps, dry skin.  No particular distribution, appears on arms, legs, and trunk of body.  Denies new soaps or detergents.

## 2011-06-09 NOTE — Discharge Instructions (Signed)
We have discussed her x-ray results and recommended treatment with followup with your primary care Dr. or the orthopedic provider. If any changes such as exacerbated pain or leg swelling or new symptoms should go to the emergency department for further evaluation.    Arthralgia Your caregiver has diagnosed you as suffering from an arthralgia. Arthralgia means there is pain in a joint. This can come from many reasons including:  Bruising the joint which causes soreness (inflammation) in the joint.   Wear and tear on the joints which occur as we grow older (osteoarthritis).   Overusing the joint.   Various forms of arthritis.   Infections of the joint.  Regardless of the cause of pain in your joint, most of these different pains respond to anti-inflammatory drugs and rest. The exception to this is when a joint is infected, and these cases are treated with antibiotics, if it is a bacterial infection. HOME CARE INSTRUCTIONS   Rest the injured area for as long as directed by your caregiver. Then slowly start using the joint as directed by your caregiver and as the pain allows. Crutches as directed may be useful if the ankles, knees or hips are involved. If the knee was splinted or casted, continue use and care as directed. If an stretchy or elastic wrapping bandage has been applied today, it should be removed and re-applied every 3 to 4 hours. It should not be applied tightly, but firmly enough to keep swelling down. Watch toes and feet for swelling, bluish discoloration, coldness, numbness or excessive pain. If any of these problems (symptoms) occur, remove the ace bandage and re-apply more loosely. If these symptoms persist, contact your caregiver or return to this location.   For the first 24 hours, keep the injured extremity elevated on pillows while lying down.   Apply ice for 15 to 20 minutes to the sore joint every couple hours while awake for the first half day. Then 3 to 4 times per day  for the first 48 hours. Put the ice in a plastic bag and place a towel between the bag of ice and your skin.   Wear any splinting, casting, elastic bandage applications, or slings as instructed.   Only take over-the-counter or prescription medicines for pain, discomfort, or fever as directed by your caregiver. Do not use aspirin immediately after the injury unless instructed by your physician. Aspirin can cause increased bleeding and bruising of the tissues.   If you were given crutches, continue to use them as instructed and do not resume weight bearing on the sore joint until instructed.  Persistent pain and inability to use the sore joint as directed for more than 2 to 3 days are warning signs indicating that you should see a caregiver for a follow-up visit as soon as possible. Initially, a hairline fracture (break in bone) may not be evident on X-rays. Persistent pain and swelling indicate that further evaluation, non-weight bearing or use of the joint (use of crutches or slings as instructed), or further X-rays are indicated. X-rays may sometimes not show a small fracture until a week or 10 days later. Make a follow-up appointment with your own caregiver or one to whom we have referred you. A radiologist (specialist in reading X-rays) may read your X-rays. Make sure you know how you are to obtain your X-ray results. Do not assume everything is normal if you do not hear from [place]. SEEK MEDICAL CARE IF: Bruising, swelling, or pain increases. SEEK IMMEDIATE MEDICAL CARE  IF:   Your fingers or toes are numb or blue.   The pain is not responding to medications and continues to stay the same or get worse.   The pain in your joint becomes severe.   You develop a fever over 102 F (38.9 C).   It becomes impossible to move or use the joint.  MAKE SURE YOU:   Understand these instructions.   Will watch your condition.   Will get help right away if you are not doing well or get worse.  Document  Released: 01/08/2005 Document Revised: 12/28/2010 Document Reviewed: 08/27/2007 [facility] Patient Information 2012 [place], [place].Arthralgia Your caregiver has diagnosed you as suffering from an arthralgia. Arthralgia means there is pain in a joint. This can come from many reasons including:  Bruising the joint which causes soreness (inflammation) in the joint.   Wear and tear on the joints which occur as we grow older (osteoarthritis).   Overusing the joint.   Various forms of arthritis.   Infections of the joint.  Regardless of the cause of pain in your joint, most of these different pains respond to anti-inflammatory drugs and rest. The exception to this is when a joint is infected, and these cases are treated with antibiotics, if it is a bacterial infection. HOME CARE INSTRUCTIONS   Rest the injured area for as long as directed by your caregiver. Then slowly start using the joint as directed by your caregiver and as the pain allows. Crutches as directed may be useful if the ankles, knees or hips are involved. If the knee was splinted or casted, continue use and care as directed. If an stretchy or elastic wrapping bandage has been applied today, it should be removed and re-applied every 3 to 4 hours. It should not be applied tightly, but firmly enough to keep swelling down. Watch toes and feet for swelling, bluish discoloration, coldness, numbness or excessive pain. If any of these problems (symptoms) occur, remove the ace bandage and re-apply more loosely. If these symptoms persist, contact your caregiver or return to this location.   For the first 24 hours, keep the injured extremity elevated on pillows while lying down.   Apply ice for 15 to 20 minutes to the sore joint every couple hours while awake for the first half day. Then 3 to 4 times per day for the first 48 hours. Put the ice in a plastic bag and place a towel between the bag of ice and your skin.   Wear any splinting, casting,  elastic bandage applications, or slings as instructed.   Only take over-the-counter or prescription medicines for pain, discomfort, or fever as directed by your caregiver. Do not use aspirin immediately after the injury unless instructed by your physician. Aspirin can cause increased bleeding and bruising of the tissues.   If you were given crutches, continue to use them as instructed and do not resume weight bearing on the sore joint until instructed.  Persistent pain and inability to use the sore joint as directed for more than 2 to 3 days are warning signs indicating that you should see a caregiver for a follow-up visit as soon as possible. Initially, a hairline fracture (break in bone) may not be evident on X-rays. Persistent pain and swelling indicate that further evaluation, non-weight bearing or use of the joint (use of crutches or slings as instructed), or further X-rays are indicated. X-rays may sometimes not show a small fracture until a week or 10 days later. Make a  follow-up appointment with your own caregiver or one to whom we have referred you. A radiologist (specialist in reading X-rays) may read your X-rays. Make sure you know how you are to obtain your X-ray results. Do not assume everything is normal if you do not hear from [place]. SEEK MEDICAL CARE IF: Bruising, swelling, or pain increases. SEEK IMMEDIATE MEDICAL CARE IF:   Your fingers or toes are numb or blue.   The pain is not responding to medications and continues to stay the same or get worse.   The pain in your joint becomes severe.   You develop a fever over 102 F (38.9 C).   It becomes impossible to move or use the joint.  MAKE SURE YOU:   Understand these instructions.   Will watch your condition.   Will get help right away if you are not doing well or get worse.  Document Released: 01/08/2005 Document Revised: 12/28/2010 Document Reviewed: 08/27/2007 [facility] Patient Information 2012 [place], [place].

## 2011-07-19 ENCOUNTER — Telehealth (INDEPENDENT_AMBULATORY_CARE_PROVIDER_SITE_OTHER): Payer: Self-pay | Admitting: General Surgery

## 2011-07-19 NOTE — Telephone Encounter (Signed)
Called pt and told her that she may be there an extra day maybe up to three days it all depends how she does after surgery and pt was ok with that

## 2011-07-24 ENCOUNTER — Encounter (HOSPITAL_COMMUNITY): Payer: Self-pay | Admitting: Pharmacy Technician

## 2011-08-01 ENCOUNTER — Other Ambulatory Visit: Payer: Self-pay

## 2011-08-01 ENCOUNTER — Encounter (HOSPITAL_COMMUNITY): Payer: Self-pay

## 2011-08-01 ENCOUNTER — Encounter (HOSPITAL_COMMUNITY)
Admission: RE | Admit: 2011-08-01 | Discharge: 2011-08-01 | Disposition: A | Discharge status: Home / Self Care | Payer: Medicaid Other | Source: Ambulatory Visit | Attending: Surgery | Admitting: Surgery

## 2011-08-01 ENCOUNTER — Other Ambulatory Visit (HOSPITAL_COMMUNITY): Payer: Medicaid Other

## 2011-08-01 ENCOUNTER — Telehealth (INDEPENDENT_AMBULATORY_CARE_PROVIDER_SITE_OTHER): Payer: Self-pay | Admitting: General Surgery

## 2011-08-01 HISTORY — DX: Shortness of breath: R06.02

## 2011-08-01 HISTORY — DX: Nausea with vomiting, unspecified: R11.2

## 2011-08-01 HISTORY — DX: Fatty (change of) liver, not elsewhere classified: K76.0

## 2011-08-01 HISTORY — DX: Gastro-esophageal reflux disease without esophagitis: K21.9

## 2011-08-01 HISTORY — DX: Other specified postprocedural states: Z98.890

## 2011-08-01 HISTORY — DX: Chronic kidney disease, unspecified: N18.9

## 2011-08-01 HISTORY — DX: Mental disorder, not otherwise specified: F99

## 2011-08-01 HISTORY — DX: Anxiety disorder, unspecified: F41.9

## 2011-08-01 HISTORY — DX: Angina pectoris, unspecified: I20.9

## 2011-08-01 HISTORY — DX: Cardiac arrhythmia, unspecified: I49.9

## 2011-08-01 HISTORY — DX: Psoriasis, unspecified: L40.9

## 2011-08-01 HISTORY — DX: Sleep apnea, unspecified: G47.30

## 2011-08-01 LAB — CBC
HCT: 36.9 % (ref 36.0–46.0)
MCH: 30.2 pg (ref 26.0–34.0)
MCV: 92.7 fL (ref 78.0–100.0)
RBC: 3.98 MIL/uL (ref 3.87–5.11)
WBC: 7.6 10*3/uL (ref 4.0–10.5)

## 2011-08-01 LAB — BASIC METABOLIC PANEL
BUN: 12 mg/dL (ref 6–23)
CO2: 23 mEq/L (ref 19–32)
Calcium: 8.8 mg/dL (ref 8.4–10.5)
Chloride: 105 mEq/L (ref 96–112)
Creatinine, Ser: 0.74 mg/dL (ref 0.50–1.10)
Glucose, Bld: 159 mg/dL — ABNORMAL HIGH (ref 70–99)

## 2011-08-01 NOTE — Telephone Encounter (Signed)
Pt has just left her pre-op screening appt and is reporting to [facility] that she has had an exacerbation of psoriasis, secondary to her nerves.  She is using TAC cream topically, but the flair is now generalized and spreading.  She does NOT want her surgery postponed, as she has arranged for Medicaid to pay for it and is anxious for the repair of the hernia.  Pt is also asking for "a steroid shot" to help abate the outbreak and "medicine for the nerves."  Recommended she contact her PCP for both of those; she understands and will call them now.  FYI.

## 2011-08-01 NOTE — Pre-Procedure Instructions (Signed)
Instructed patient at PST visit to notify PCP and surgeon regarding flare of psoriasis- wide spread and has increased with stress of surgery- areas are reddened and over entire body, abdomen included. Has areas on legs where she has scratched and are scabbed over- nothing open or draining. HAS hx MRSA and has seen INFECTIOUS DISEASE MD in the past- states has had "boils" in my skin that have been treated with CLINDAMYCIN, but not in the last year..   Chest x ray 11/12 EP[facility].  Note emailed to Dr [name] for [facility]

## 2011-08-01 NOTE — Progress Notes (Signed)
08/01/11 0906  OBSTRUCTIVE SLEEP APNEA  Have you ever been diagnosed with sleep apnea through a sleep study? No  Do you snore loudly (loud enough to be heard through closed doors)?  1  Do you often feel tired, fatigued, or sleepy during the daytime? 1  Has anyone observed you stop breathing during your sleep? 1  Do you have, or are you being treated for high blood pressure? 1  BMI more than 35 kg/m2? 1  Age over 37 years old? 0  Neck circumference greater than 40 cm/18 inches? 1  Gender: 0  Obstructive Sleep Apnea Score 6   Score 4 or greater  Updated health history;Results sent to PCP

## 2011-08-01 NOTE — Patient Instructions (Addendum)
20 Lavonda Thal  08/01/2011   Your procedure is scheduled on:  08/06/11  Vivianne Master gery  [id number]      MONDAY  Report to [facility] at 0515      AM.  Call this number if you have problems the morning of surgery: [phone]     Or PST   [phone]  [facility]   Remember: TAKE ONE HALF DOSE INSULIN NIGHT BEFORE SURGERY-  EAT SNACK BEFORE BEDTIME Sunday NIGHT  Do not eat food: OR DRINK ANY FLUIDS After Midnight. Sunday NIGHT    Take these medicines the morning of surgery with A SIP OF WATER: Levothyroxine NO DIABETES MEDICINE MORNING OF SURGERY  Do not wear jewelry, make-up or nail polish.  Do not wear lotions, powders, or perfumes. You may wear deodorant.  Do not shave 48 hours prior to surgery.  Do not bring valuables to the hospital.  Contacts, dentures or bridgework may not be worn into surgery.  Leave suitcase in the car. After surgery it may be brought to your room.  For patients admitted to the hospital, checkout time is 11:00 AM the day of discharge.   Patients discharged the day of surgery will not be allowed to drive home.  Name and phone number of your driver:   Husband  Or mom                                                                   Special Instructions: CHG Shower Use Special Wash: 1/2 bottle night before surgery and 1/2 bottle morning of surgery. REGULAR SOAP FACE AND PRIVATES              LADIES- NO SHAVING 48 HOURS BEFORE USING BETASEPT SOAP.                Please read over the following fact sheets that you were given: MRSA Information

## 2011-08-02 ENCOUNTER — Telehealth (INDEPENDENT_AMBULATORY_CARE_PROVIDER_SITE_OTHER): Payer: Self-pay | Admitting: General Surgery

## 2011-08-02 ENCOUNTER — Encounter (HOSPITAL_COMMUNITY): Payer: Self-pay | Admitting: *Deleted

## 2011-08-02 ENCOUNTER — Telehealth (HOSPITAL_COMMUNITY): Payer: Self-pay | Admitting: *Deleted

## 2011-08-02 ENCOUNTER — Emergency Department (HOSPITAL_COMMUNITY)
Admission: EM | Admit: 2011-08-02 | Discharge: 2011-08-02 | Disposition: A | Discharge status: Home / Self Care | Payer: Medicaid Other | Source: Home / Self Care | Attending: Emergency Medicine | Admitting: Emergency Medicine

## 2011-08-02 DIAGNOSIS — L408 Other psoriasis: Secondary | ICD-10-CM

## 2011-08-02 DIAGNOSIS — L409 Psoriasis, unspecified: Secondary | ICD-10-CM

## 2011-08-02 MED ORDER — BETAMETHASONE DIPROPIONATE 0.05 % EX CREA: TOPICAL_CREAM | Freq: Two times a day (BID) | CUTANEOUS | Status: DC

## 2011-08-02 MED ORDER — DERMAGRAN BC EX CREA: TOPICAL_CREAM | Freq: Two times a day (BID) | CUTANEOUS | Status: DC

## 2011-08-02 NOTE — ED Provider Notes (Signed)
History     CSN: [id number]  Arrival date & time 08/02/11  1424   First MD Initiated Contact with Patient 08/02/11 1425      Chief Complaint  Patient presents with  . Rash    (Consider location/radiation/quality/duration/timing/severity/associated sxs/prior treatment) HPI Comments: Patient presents urgent care today describing that she has never had such extensive psoriasis flareup. She feels that she has been very nervous about this coming surgery to repair a ventral abdominal hernia. She denies using anything else for for her psoriasis. It's itchy in this all over her arms classical patches on both of her elbows probe all over her torso and abdomen and lower extremities. It's somewhat itchy and is worried that she is having surgery very soon once this to get resolved or improved. Patient attempted to contact her primary care doctor but apparently is on vacation.  Patient is a 36 y.o. female presenting with rash. The history is provided by the patient and the spouse.  Rash  This is a new problem. The current episode started more than 2 days ago. The problem has not changed since onset.The rash is present on the torso, back, left upper leg, left arm, trunk, abdomen, right hand and right arm. The patient is experiencing no pain. The pain has been constant since onset. Associated symptoms include itching. Pertinent negatives include no weeping. She has tried nothing for the symptoms. The treatment provided no relief.    Past Medical History  Diagnosis Date  . Diabetes mellitus   . Migraine   . Hypoactive thyroid   . Cancer     cervical  . Osteoporosis   . Hypertension   . High cholesterol   . PONV (postoperative nausea and vomiting)   . Anginal pain 2011    "stress per Dr Pang"-occ at present from increased stress  . Dysrhythmia     hx MVP  . Shortness of breath     with exertion/   ? adult onset asthma  . GERD (gastroesophageal reflux disease)   . Chronic kidney disease    stones in past,  hematuria daily- states hasnt been evaluated due to lack of insurance  . Psoriasis     scattered throughout body- large amount- states increased with stress of surgery  . Fatty liver     with gall bladder sludge by pt history/ no stones  . Stroke     ? subacranoid bleed-  no defits  . Mental disorder     bipolar  . Anxiety   . Sleep apnea      STOP BANG SCORE 6    Past Surgical History  Procedure Date  . Cesarean section   . Appendectomy   . Lipoma     total 3  . Neuroplasty / transposition median nerve at carpal tunnel bilateral   . Cervical cancer     Family History  Problem Relation Age of Onset  . Hypertension Mother   . Glaucoma Mother   . Hypertension Father   . Cancer Father     precancerious polyps  . Cancer Maternal Uncle     colon    History  Substance Use Topics  . Smoking status: Never Smoker   . Smokeless tobacco: Never Used  . Alcohol Use: No    OB History    Grav Para Term Preterm Abortions TAB SAB Ect Mult Living                  Review of Systems  Constitutional:  Negative for fever, activity change, appetite change and unexpected weight change.  Skin: Positive for itching and rash. Negative for wound.    Allergies  Cephalexin; Cephalexin; Latex; Lisinopril; and Tea  Home Medications   Current Outpatient Rx  Name Route Sig Dispense Refill  . ACETAMINOPHEN 325 MG PO TABS Oral Take 650 mg by mouth every 6 (six) hours as needed. For pain    . ACYCLOVIR 200 MG PO CAPS Oral Take 400 mg by mouth 2 (two) times daily.     . ATENOLOL 50 MG PO TABS Oral Take 100 mg by mouth at bedtime.     . B COMPLEX VITAMINS PO CAPS Oral Take 1 capsule by mouth daily.    Marland Kitchen DIPHENHYDRAMINE HCL 25 MG PO CAPS Oral Take 50 mg by mouth at bedtime as needed. For sleep    . FISH OIL + D3 1200-1000 MG-UNIT PO CAPS Oral Take 1 capsule by mouth daily.    Marland Kitchen GLIMEPIRIDE 4 MG PO TABS Oral Take 4 mg by mouth daily before breakfast.    . IBUPROFEN 200 MG PO  TABS Oral Take 400 mg by mouth every 6 (six) hours as needed.    . INSULIN ISOPHANE & REGULAR (70-30) 100 UNIT/ML [place] SUSP Subcutaneous Inject 35 Units into the skin 2 (two) times daily with a meal.     . LEVOTHYROXINE SODIUM 112 MCG PO TABS Oral Take 112 mcg by mouth daily before breakfast.     . METFORMIN HCL 1000 MG PO TABS Oral Take 1,000 mg by mouth 2 (two) times daily with a meal.    . ADULT MULTIVITAMIN W/MINERALS CH Oral Take 1 tablet by mouth daily.    . TRIAMCINOLONE ACETONIDE 0.1 % EX CREA Topical Apply topically 3 (three) times daily. Apply bid x 4 weeks (avoid intertrigo areas)    . BETAMETHASONE DIPROPIONATE 0.05 % EX CREA Topical Apply topically 2 (two) times daily. Apply bid x 2 weeks 45 g 0  . DERMAGRAN BC EX CREA Topical Apply topically 2 (two) times daily. Apply daily 98 g 0    BP 133/67  Pulse 67  Temp 98.4 F (36.9 C) (Oral)  Resp 18  SpO2 98%  LMP 07/22/2011  Physical Exam  Vitals reviewed. Constitutional: Vital signs are normal. She appears well-developed.  Non-toxic appearance. She does not have a sickly appearance. She does not appear ill. No distress.  Skin: Rash noted. No abrasion noted. Rash is macular and papular. There is erythema.       ED Course  Procedures (including critical care time)  Labs Reviewed - No data to display No results found.   1. Psoriasis       MDM  Psoriasis flareup moderate-to-severe. Patient has been instructed that she will need further management with a dermatologist office her primary care Dr. as this is severe psoriasis. Might need ultraviolet therapy versus immunomodulators. Have started patient today on I posted see topical steroids along with emollients. Patient is in the pre-operatory . For an abdominal hernia repair, I have indicated to her that this flareup does not constitute a contraindication for her surgery unless a surgeon considers this is a problem. My opinion does not constitute a resurgery to  Clearance.        Jimmie Molly, MD 08/02/11 1524

## 2011-08-02 NOTE — ED Notes (Signed)
Fax received from [facility] pharmacy saying Betamethasone cream requires a prior authorization for Medicaid. Discussed with Dr. Tressia Danas and she changed it to Triamcinolone 0.5 % ointment 30 gm apply twice a day for 2 weeks.  I called the pharmacist @ [phone] and gave the new order as written. [name] 08/02/2011

## 2011-08-02 NOTE — Telephone Encounter (Signed)
Pt called her PCP about the psoriasis outbreak and needing a steroid injection, but cannot be seen in time for surgery.  She is going to go to an Urgent Care to see about getting one.

## 2011-08-02 NOTE — ED Notes (Signed)
Pt reports a flare up of her psorasis--it is on her trunk, arms and legs and scalp  She is scheduled for surgery soon--ventral hernia repair

## 2011-08-06 ENCOUNTER — Telehealth (INDEPENDENT_AMBULATORY_CARE_PROVIDER_SITE_OTHER): Payer: Self-pay

## 2011-08-06 ENCOUNTER — Ambulatory Visit (HOSPITAL_COMMUNITY)
Admission: RE | Admit: 2011-08-06 | Discharge: 2011-08-06 | Disposition: A | Discharge status: Home / Self Care | Payer: Medicaid Other | Source: Ambulatory Visit | Attending: Surgery | Admitting: Surgery

## 2011-08-06 ENCOUNTER — Encounter (HOSPITAL_COMMUNITY): Payer: Self-pay | Admitting: Anesthesiology

## 2011-08-06 ENCOUNTER — Encounter (HOSPITAL_COMMUNITY)
Admission: RE | Disposition: A | Discharge status: Home / Self Care | Payer: Self-pay | Source: Ambulatory Visit | Attending: Surgery

## 2011-08-06 ENCOUNTER — Other Ambulatory Visit (INDEPENDENT_AMBULATORY_CARE_PROVIDER_SITE_OTHER): Payer: Self-pay | Admitting: Surgery

## 2011-08-06 ENCOUNTER — Encounter (HOSPITAL_COMMUNITY): Payer: Self-pay | Admitting: *Deleted

## 2011-08-06 ENCOUNTER — Ambulatory Visit (HOSPITAL_COMMUNITY): Payer: Medicaid Other | Admitting: Anesthesiology

## 2011-08-06 DIAGNOSIS — Z5309 Procedure and treatment not carried out because of other contraindication: Secondary | ICD-10-CM | POA: Insufficient documentation

## 2011-08-06 DIAGNOSIS — I10 Essential (primary) hypertension: Secondary | ICD-10-CM | POA: Insufficient documentation

## 2011-08-06 DIAGNOSIS — E039 Hypothyroidism, unspecified: Secondary | ICD-10-CM | POA: Insufficient documentation

## 2011-08-06 DIAGNOSIS — L989 Disorder of the skin and subcutaneous tissue, unspecified: Secondary | ICD-10-CM

## 2011-08-06 DIAGNOSIS — R21 Rash and other nonspecific skin eruption: Secondary | ICD-10-CM | POA: Insufficient documentation

## 2011-08-06 DIAGNOSIS — K439 Ventral hernia without obstruction or gangrene: Secondary | ICD-10-CM | POA: Insufficient documentation

## 2011-08-06 LAB — GLUCOSE, CAPILLARY: Glucose-Capillary: 154 mg/dL — ABNORMAL HIGH (ref 70–99)

## 2011-08-06 SURGERY — CANCELLED PROCEDURE
Anesthesia: General

## 2011-08-06 MED ORDER — VANCOMYCIN HCL 1000 MG IV SOLR
1500.0000 mg | INTRAVENOUS | Status: DC
  Filled 2011-08-06: qty 1500

## 2011-08-06 MED ORDER — MIDAZOLAM HCL 5 MG/5ML IJ SOLN
INTRAMUSCULAR | Status: DC | PRN
  Administered 2011-08-06: 2 mg via INTRAVENOUS

## 2011-08-06 NOTE — OR Nursing (Signed)
Patient never entered the operating room

## 2011-08-06 NOTE — Telephone Encounter (Signed)
I called the pt back to inform her and she states she had called them to confirm.  They had misspelled her name when I called and that's why they couldn't find her.  She confirmed the appt for July 31st.  She also spoke to [facility] with Medicaid and he said she needs to call the Dermatology office and ask for an override to be seen because the medical dr will not refer her in a timely manner.  I gave her the # to [facility] and she will let me know if there is an issue.

## 2011-08-06 NOTE — Progress Notes (Signed)
The patient has had a wide-spread skin eruption over the last couple of weeks.  This covers her [name], legs, and her entire abdomen, clustered mostly around her mid-abdomen where we will be operating.  These lesions are crusted, but do not appear to be weeping.  The lesions over her elbows appear to be classic psoriasis, but the other lesions over her abdomen seem to be different.  I do not feel that it would be safe to operate through a surgical field with untreated skin eruptions.  We will cancel her case today and refer her to Dermatology as soon as possible.  Urgent Care has been treating her with topical steroid cream with slight improvement, but she needs more aggressive treatment to clear these skin lesions before we operate through this field.  Discussed with patient.  [name]. [name], MD, [facility]  08/06/2011 7:33 AM

## 2011-08-06 NOTE — Anesthesia Preprocedure Evaluation (Signed)
Anesthesia Evaluation  Patient identified by MRN, date of birth, ID band Patient awake    Reviewed: Allergy & Precautions, H&P , NPO status , Patient's Chart, lab work & pertinent test results  History of Anesthesia Complications (+) PONV  Airway Mallampati: III TM Distance: <3 FB Neck ROM: Full    Dental No notable dental hx. (+) Dental Advisory Given   Pulmonary neg pulmonary ROS,  breath sounds clear to auscultation  + decreased breath sounds      Cardiovascular hypertension, Pt. on medications Rhythm:Regular Rate:Normal     Neuro/Psych negative neurological ROS  negative psych ROS   GI/Hepatic negative GI ROS, Neg liver ROS,   Endo/Other  Hypothyroidism Morbid obesity  Renal/GU negative Renal ROS  negative genitourinary   Musculoskeletal negative musculoskeletal ROS (+)   Abdominal   Peds negative pediatric ROS (+)  Hematology negative hematology ROS (+)   Anesthesia Other Findings   Reproductive/Obstetrics negative OB ROS                           Anesthesia Physical Anesthesia Plan  ASA: III  Anesthesia Plan: General   Post-op Pain Management:    Induction: Intravenous  Airway Management Planned: Oral ETT  Additional Equipment:   Intra-op Plan:   Post-operative Plan: Possible Post-op intubation/ventilation  Informed Consent: I have reviewed the patients History and Physical, chart, labs and discussed the procedure including the risks, benefits and alternatives for the proposed anesthesia with the patient or authorized representative who has indicated his/her understanding and acceptance.   Dental advisory given  Plan Discussed with: CRNA  Anesthesia Plan Comments:         Anesthesia Quick Evaluation

## 2011-08-06 NOTE — Telephone Encounter (Signed)
Per Dr [name] the patient's surgery got cancelled due to a breakout of possible psoriasis.  She needs to be seen by Dermatology asap to be treated.  I called the pt and asked if she has seen anyone before and she has not.  I attempted to call [facility] [phone] and they did not answer.  I left a message to be called back and did not get a call.  I tried [facility]  [phone] and once we realized she has Medicaid, they said the provider on her card needs to make the referral.  That would be [employer] [phone].  I called the pt and told her this.  She states she hasn't seen them yet but can see them July 31st.  She wants me to let them know she needs a referral.  I called [facility] and they do not see her in the system.  Their new patient appointments are going out until September and they must see her before a referral can be made.  I will notify the pt

## 2011-08-07 ENCOUNTER — Telehealth (INDEPENDENT_AMBULATORY_CARE_PROVIDER_SITE_OTHER): Payer: Self-pay | Admitting: General Surgery

## 2011-08-07 NOTE — Telephone Encounter (Signed)
Called pt this afternoon and told her that Dr Para Skeans nurse called our office and stated that Dr Terri Piedra will not take new medicaid pts until Oct 2013. She stated that she was a FP appt on July 31 to reck psoriasis or skin breakout. I told pt that we will need the ok on who ever she may is for her skin problem before we do any hernia surgery, pt understand due to her getting mesh. She will go to [facility] and get a binder to help with her hernia

## 2011-08-09 ENCOUNTER — Telehealth (HOSPITAL_COMMUNITY): Payer: Self-pay | Admitting: *Deleted

## 2011-08-09 NOTE — ED Notes (Signed)
Dr. Dorita Sciara office staff called and said they got a referral request from Dr. Ladon Applebaum on 7/15. I told her there was no note on d/c instructions, doctors or nurses note saying we sent it. I told her I did not have a referral request from Dr. Ladon Applebaum for this pt.  I told her the d/c instructions tell pt. to f/u with [facility].  She said that is not their office.  She said she was going to shred it. Vassie Moselle 08/09/2011

## 2011-08-16 ENCOUNTER — Encounter (HOSPITAL_COMMUNITY): Payer: Self-pay

## 2011-08-16 ENCOUNTER — Emergency Department (INDEPENDENT_AMBULATORY_CARE_PROVIDER_SITE_OTHER)
Admission: EM | Admit: 2011-08-16 | Discharge: 2011-08-16 | Disposition: A | Discharge status: Home / Self Care | Payer: Medicaid Other | Source: Home / Self Care | Attending: Emergency Medicine | Admitting: Emergency Medicine

## 2011-08-16 DIAGNOSIS — H6093 Unspecified otitis externa, bilateral: Secondary | ICD-10-CM

## 2011-08-16 DIAGNOSIS — H60399 Other infective otitis externa, unspecified ear: Secondary | ICD-10-CM

## 2011-08-16 MED ORDER — ACETAMINOPHEN-CODEINE #3 300-30 MG PO TABS: 1.0000 | ORAL_TABLET | Freq: Four times a day (QID) | ORAL | Status: AC | PRN

## 2011-08-16 MED ORDER — CIPROFLOXACIN-HYDROCORTISONE 0.2-1 % OT SUSP: 3.0000 [drp] | Freq: Two times a day (BID) | OTIC | Status: DC

## 2011-08-16 MED ORDER — CIPROFLOXACIN-HYDROCORTISONE 0.2-1 % OT SUSP: 3.0000 [drp] | Freq: Two times a day (BID) | OTIC | Status: AC

## 2011-08-16 NOTE — ED Provider Notes (Signed)
History     CSN: [id number]  Arrival date & time 08/16/11  1703   First MD Initiated Contact with Patient 08/16/11 1705      Chief Complaint  Patient presents with  . Otalgia    (Consider location/radiation/quality/duration/timing/severity/associated sxs/prior treatment) HPI Comments: Patient presents to urgent care complaining of bilateral ear pain most severe the left ear hurts. She describes that she has had this type of ear infections before and are related to her psoriasis flareups. It's very tender at touch despite barely touching her left ear. She feels her right ear is just starting as his mildly sore. Patient denies any ear trauma or drainage or recent swimming and bodies of water. Patient denies fevers, chills dizziness or hearing loss.  Patient is a 36 y.o. female presenting with ear pain. The history is provided by the patient.  Otalgia This is a new problem. The current episode started more than 2 days ago. There is pain in both ears. The problem occurs constantly. The problem has been gradually worsening. There has been no fever. Associated symptoms include ear discharge and sore throat. Pertinent negatives include no headaches, no rhinorrhea, no abdominal pain, no vomiting, no neck pain and no rash. Her past medical history does not include chronic ear infection or tympanostomy tube.    Past Medical History  Diagnosis Date  . Diabetes mellitus   . Migraine   . Hypoactive thyroid   . Cancer     cervical  . Osteoporosis   . Hypertension   . High cholesterol   . PONV (postoperative nausea and vomiting)   . Anginal pain 2011    "stress per Dr Pang"-occ at present from increased stress  . Dysrhythmia     hx MVP  . Shortness of breath     with exertion/   ? adult onset asthma  . GERD (gastroesophageal reflux disease)   . Chronic kidney disease     stones in past,  hematuria daily- states hasnt been evaluated due to lack of insurance  . Psoriasis     scattered  throughout body- large amount- states increased with stress of surgery  . Fatty liver     with gall bladder sludge by pt history/ no stones  . Stroke     ? subacranoid bleed-  no defits  . Mental disorder     bipolar  . Anxiety   . Sleep apnea      STOP BANG SCORE 6    Past Surgical History  Procedure Date  . Cesarean section   . Appendectomy   . Lipoma     total 3  . Neuroplasty / transposition median nerve at carpal tunnel bilateral   . Cervical cancer     Family History  Problem Relation Age of Onset  . Hypertension Mother   . Glaucoma Mother   . Hypertension Father   . Cancer Father     precancerious polyps  . Cancer Maternal Uncle     colon    History  Substance Use Topics  . Smoking status: Never Smoker   . Smokeless tobacco: Never Used  . Alcohol Use: No    OB History    Grav Para Term Preterm Abortions TAB SAB Ect Mult Living                  Review of Systems  Constitutional: Negative for fever, chills, activity change and appetite change.  HENT: Positive for ear pain, sore throat and ear discharge. Negative  for congestion, rhinorrhea, neck pain, postnasal drip and tinnitus.   Gastrointestinal: Negative for vomiting and abdominal pain.  Skin: Negative for rash.  Neurological: Negative for dizziness and headaches.    Allergies  Cephalexin; Cephalexin; Latex; Lisinopril; and Tea  Home Medications   Current Outpatient Rx  Name Route Sig Dispense Refill  . ACYCLOVIR 200 MG PO CAPS Oral Take 400 mg by mouth 2 (two) times daily.     . ATENOLOL 50 MG PO TABS Oral Take 100 mg by mouth at bedtime.     . B COMPLEX VITAMINS PO CAPS Oral Take 1 capsule by mouth daily.    Marland Kitchen FISH OIL + D3 1200-1000 MG-UNIT PO CAPS Oral Take 1 capsule by mouth daily.    Marland Kitchen GLIMEPIRIDE 4 MG PO TABS Oral Take 4 mg by mouth daily before breakfast.    . IBUPROFEN 200 MG PO TABS Oral Take 400 mg by mouth every 6 (six) hours as needed.    . INSULIN ISOPHANE & REGULAR (70-30) 100  UNIT/ML [place] SUSP Subcutaneous Inject 35 Units into the skin 2 (two) times daily with a meal.     . LEVOTHYROXINE SODIUM 112 MCG PO TABS Oral Take 112 mcg by mouth daily before breakfast.     . METFORMIN HCL 1000 MG PO TABS Oral Take 1,000 mg by mouth 2 (two) times daily with a meal.    . ADULT MULTIVITAMIN W/MINERALS CH Oral Take 1 tablet by mouth daily.    . ACETAMINOPHEN 325 MG PO TABS Oral Take 650 mg by mouth every 6 (six) hours as needed. For pain    . ACETAMINOPHEN-CODEINE #3 300-30 MG PO TABS Oral Take 1-2 tablets by mouth every 6 (six) hours as needed for pain. 15 tablet 0  . BETAMETHASONE DIPROPIONATE 0.05 % EX CREA Topical Apply topically 2 (two) times daily. Apply bid x 2 weeks 45 g 0  . CIPROFLOXACIN-HYDROCORTISONE 0.2-1 % OT SUSP Both Ears Place 3 drops into both ears 2 (two) times daily. 10 mL 0  . DIPHENHYDRAMINE HCL 25 MG PO CAPS Oral Take 50 mg by mouth at bedtime as needed. For sleep    . DERMAGRAN BC EX CREA Topical Apply topically 2 (two) times daily. Apply daily 98 g 0  . TRIAMCINOLONE ACETONIDE 0.1 % EX CREA Topical Apply topically 3 (three) times daily. Apply bid x 4 weeks (avoid intertrigo areas)      BP 139/80  Pulse 82  Temp 98.9 F (37.2 C) (Oral)  Resp 22  SpO2 98%  LMP 07/22/2011  Physical Exam  Nursing note and vitals reviewed. Constitutional: She appears well-developed and well-nourished.  HENT:  Right Ear: No drainage or swelling. No foreign bodies. No mastoid tenderness. Tympanic membrane is not injected and not erythematous. No decreased hearing is noted.  Left Ear: No drainage, swelling or tenderness. No foreign bodies. No mastoid tenderness. Tympanic membrane is not injected, not perforated, not retracted and not bulging. Tympanic membrane mobility is normal.  No middle ear effusion.  Ears:    ED Course  Procedures (including critical care time)  Labs Reviewed - No data to display No results found.   1. Bilateral external ear infections        MDM  Bilateral otitis externa possibly induced by psoriatic plagues can both ear canals. Patient was prescribed a quinolone antibiotic ear drops. She was advised that if no improvement in 48-72 hours if she needed to followup.        Jimmie Molly, MD  08/16/11 2106 

## 2011-08-16 NOTE — ED Notes (Signed)
Patient is resting comfortably.awaiting md arrival. Denies needs at this time.

## 2011-08-16 NOTE — ED Notes (Signed)
Patient states her psoriasis is flaring up, and is probably in her ears, causing pain , thinks she may have an ear infection caused by her flare up; syx are much worse than usual since Sunday

## 2011-09-13 ENCOUNTER — Ambulatory Visit (INDEPENDENT_AMBULATORY_CARE_PROVIDER_SITE_OTHER): Payer: Medicaid Other | Admitting: Surgery

## 2011-09-13 ENCOUNTER — Encounter (INDEPENDENT_AMBULATORY_CARE_PROVIDER_SITE_OTHER): Payer: Self-pay | Admitting: Surgery

## 2011-09-13 VITALS — BP 126/84 | HR 76 | Temp 98.3°F | Resp 16 | Ht 65.0 in | Wt 326.6 lb

## 2011-09-13 DIAGNOSIS — K439 Ventral hernia without obstruction or gangrene: Secondary | ICD-10-CM

## 2011-09-13 HISTORY — DX: Ventral hernia without obstruction or gangrene: K43.9

## 2011-09-13 NOTE — Patient Instructions (Addendum)
Continue taking your cyclosporine.

## 2011-09-13 NOTE — Progress Notes (Signed)
  Progress Notes   [name] (MR# [id number])       Progress Notes Info             HPI [name] is a 35 y.o. female.  Referred by Dr. Caporossi from the ED with a ventral hernia OB/GYN - Dr. Jackson-Moore HPI 35 yo female with morbid obesity (BMI 54) and several other comorbidities presents after recent visit to the ED for abdominal pain.  She is two years s/p c-section via a long upper midline incision.  This area has become more firm and tender.  She experiences more pain when she bends over.  She has had some nausea, vomiting, and constipation.  She underwent a CT scan in the ED that showed a large ventral hernia containing bowel and colon.  She presents now for surgical evaluation.  Her previous surgery was canceled due to a skin outbreak, but she is now on Cyclosporine with significant improvement.  Past Medical History   Diagnosis  Date   .  Diabetes mellitus     .  Migraine     .  Hypoactive thyroid     .  Cancer         cervical   .  Osteoporosis     .  Stroke     Psoriasis     Past Surgical History   Procedure  Date   .  Cesarean section     .  Appendectomy     .  Lipoma         total 3   .  Neuroplasty / transposition median nerve at carpal tunnel bilateral     .  Cervical cancer           Family History   Problem  Relation  Age of Onset   .  Hypertension  Mother     .  Glaucoma  Mother     .  Hypertension  Father     .  Cancer  Father         precancerious polyps   .  Cancer  Maternal Uncle         colon        Social History History   Substance Use Topics   .  Smoking status:  Never Smoker    .  Smokeless tobacco:  Not on file   .  Alcohol Use:  No         Allergies   Allergen  Reactions   .  Cephalexin  Anaphylaxis   .  Cephalexin           Current Outpatient Prescriptions   Medication  Sig  Dispense  Refill   .  acyclovir (ZOVIRAX) 200 MG capsule  Take 200 mg by mouth 2 (two) times daily.             .  atenolol-chlorthalidone (TENORETIC) 50-25 MG per tablet  Take 1 tablet by mouth daily.         .  b complex vitamins capsule  Take 1 capsule by mouth daily.         .  glimepiride (AMARYL) 4 MG tablet  Take 4 mg by mouth daily before breakfast.         .  insulin NPH-insulin regular (NOVOLIN 70/30) (70-30) 100 UNIT/ML injection  Inject 25 Units into the skin 2 (two) times daily with a meal.         .  levothyroxine (  SYNTHROID, LEVOTHROID) 112 MCG tablet  Take 112 mcg by mouth daily.           .  lisinopril (PRINIVIL,ZESTRIL) 10 MG tablet  Take 10 mg by mouth daily.         .  metFORMIN (GLUCOPHAGE) 500 MG tablet  Take 1,000 mg by mouth 2 (two) times daily with a meal.               Review of Systems Review of Systems  Constitutional: Negative for fever, chills and unexpected weight change.  HENT: Negative for hearing loss, congestion, sore throat, trouble swallowing and voice change.   Eyes: Negative for visual disturbance.  Respiratory: Positive for cough. Negative for wheezing.   Cardiovascular: Negative for chest pain, palpitations and leg swelling.  Gastrointestinal: Positive for nausea, vomiting, abdominal pain, constipation, blood in stool and anal bleeding. Negative for diarrhea and abdominal distention.  Genitourinary: Negative for hematuria, vaginal bleeding and difficulty urinating.  Musculoskeletal: Negative for arthralgias.  Skin: Negative for rash and wound.  Neurological: Positive for weakness. Negative for seizures, syncope and headaches.  Hematological: Negative for adenopathy. Does not bruise/bleed easily.  Psychiatric/Behavioral: Negative for confusion.      Blood pressure 136/95, pulse 101, temperature 97.5 F (36.4 C), temperature source Temporal, height 5' 5.5" (1.664 m), weight 326 lb 12.8 oz (148.236 kg), last menstrual period 02/23/2011, SpO2 95.00%.   Physical Exam Physical Exam Morbidly obese female in NAD HEENT:  EOMI, sclera anicteric Neck:  No  masses, no thyromegaly Lungs:  CTA bilaterally; normal respiratory effort CV:  Regular rate and rhythm; no murmurs Abd:  +bowel sounds, soft, long upper midline incision with firmness under incision.  Abdomen is softer laterally.  The hernia is partially reducible when supine Ext:  Well-perfused; no edema Skin:  Warm, dry; no sign of jaundice   Data Reviewed CT scan -   *RADIOLOGY REPORT*    Clinical Data: 3 weeks of pain. Rule out diverticulitis.   Diabetic.   CT ABDOMEN AND PELVIS WITH CONTRAST   Technique: Multidetector CT imaging of the abdomen and pelvis was   performed following the standard protocol during bolus   administration of intravenous contrast.   Contrast: 100mL OMNIPAQUE IOHEXOL 300 MG/ML SOLN   Comparison: 04/12/2010.   Findings: Ventral hernia containing bowel without obstruction. No   extraluminal bowel inflammatory process, free fluid or free air.   Appendix not clearly delineated.   Diffuse fatty infiltration liver.   Dilated gallbladder without calcified gallstones.   Nonobstructing left lower pole renal calculi. No focal hepatic,   splenic, pancreatic, adrenal or renal lesion. No abdominal aortic   aneurysm. No bony destructive lesion.   IMPRESSION:   No bowel inflammatory process noted. Please see above.   Original Report Authenticated By: STEVEN R. OLSON, M.D.                 Assessment    Large ventral hernia containing bowel Multiple medical comorbidities Psoriasis - s/p recent outbreak, improving.   Plan    Open ventral hernia repair with mesh.  The surgical procedure has been discussed with the patient.  Potential risks, benefits, alternative treatments, and expected outcomes have been explained.  All of the patient's questions at this time have been answered.  The likelihood of reaching the patient's treatment goal is good.  Her risk of recurrence and complications is elevated due to her morbid obesity.  The patient understand the  proposed surgical procedure and wishes to proceed.         [name] K. Amanda Felber, MD, FACS [facility]  09/13/2011 10:10 AM             

## 2011-09-18 ENCOUNTER — Encounter (HOSPITAL_COMMUNITY)
Admission: RE | Admit: 2011-09-18 | Discharge: 2011-09-18 | Disposition: A | Discharge status: Home / Self Care | Payer: Medicaid Other | Source: Ambulatory Visit | Attending: Surgery | Admitting: Surgery

## 2011-09-18 ENCOUNTER — Encounter (HOSPITAL_COMMUNITY): Payer: Self-pay

## 2011-09-18 ENCOUNTER — Encounter (HOSPITAL_COMMUNITY): Payer: Self-pay | Admitting: Pharmacy Technician

## 2011-09-18 DIAGNOSIS — Z01812 Encounter for preprocedural laboratory examination: Secondary | ICD-10-CM | POA: Insufficient documentation

## 2011-09-18 DIAGNOSIS — K439 Ventral hernia without obstruction or gangrene: Secondary | ICD-10-CM | POA: Insufficient documentation

## 2011-09-18 HISTORY — DX: Major depressive disorder, single episode, unspecified: F32.9

## 2011-09-18 HISTORY — DX: Pneumonia, unspecified organism: J18.9

## 2011-09-18 HISTORY — DX: Depression, unspecified: F32.A

## 2011-09-18 HISTORY — DX: Unspecified osteoarthritis, unspecified site: M19.90

## 2011-09-18 HISTORY — DX: Bipolar disorder, unspecified: F31.9

## 2011-09-18 HISTORY — DX: Anemia, unspecified: D64.9

## 2011-09-18 LAB — CBC
MCH: 30 pg (ref 26.0–34.0)
MCHC: 33.9 g/dL (ref 30.0–36.0)
MCV: 88.8 fL (ref 78.0–100.0)
Platelets: 239 10*3/uL (ref 150–400)
RDW: 13.4 % (ref 11.5–15.5)
WBC: 9.7 10*3/uL (ref 4.0–10.5)

## 2011-09-18 LAB — BASIC METABOLIC PANEL
BUN: 16 mg/dL (ref 6–23)
Calcium: 9.4 mg/dL (ref 8.4–10.5)
Chloride: 103 mEq/L (ref 96–112)
Creatinine, Ser: 0.73 mg/dL (ref 0.50–1.10)
GFR calc Af Amer: >90 mL/min (ref >90)
GFR calc non Af Amer: >90 mL/min (ref >90)

## 2011-09-18 LAB — SURGICAL PCR SCREEN: MRSA, PCR: NEGATIVE

## 2011-09-18 NOTE — Progress Notes (Signed)
Patient 's surgery cancelled on 7/13 due to eczema.  Patient has been placed on cyclosporine.

## 2011-09-18 NOTE — Patient Instructions (Signed)
[address]  09/18/2011   Your procedure is scheduled on:  10/03/11 1100a-100pm  Report to [facility] at 0830 AM.  Call this number if you have problems the morning of surgery: [phone]   Remember:   Do not eat food:After Midnight.  May have clear liquids:until Midnight .    Take these medicines the morning of surgery with A SIP OF WATER:    Do not wear jewelry, make-up or nail polish.  Do not wear lotions, powders, or perfumes.   Do not shave 48 hours prior to surgery  Do not bring valuables to the hospital.  Contacts, dentures or bridgework may not be worn into surgery.  Leave suitcase in the car. After surgery it may be brought to your room.  For patients admitted to the hospital, checkout time is 11:00 AM the day of discharge.   Patients discharged the day of surgery will not be allowed to drive home.  Name and phone number of your driver:   Special Instructions: CHG Shower Use Special Wash: 1/2 bottle night before surgery and 1/2 bottle morning of surgery. shower chin to toes with CHG.  Wash face and private parts with regular soap.    Please read over the following fact sheets that you were given: MRSA Information, coughing and deep breathing exercises, leg exercises,

## 2011-09-20 NOTE — Progress Notes (Signed)
EKG on chart 09/11/11  09/11/11 Office visit note on chart from Dr Gwenlyn Saran  Exercise Test Results on chart- 09/17/11  Pulmonary Function Test results on chart- 09/17/11

## 2011-09-26 NOTE — Progress Notes (Signed)
Patient called  In on 09/26/11 to update medications.  Medications updated as per pt on home medications list.  Patient asked regarding lab work for dermatologist .  Informed patient that lab work is in EPIC and to have dermatologist to go to [facility] system to obtain labwork. Patient also stated Stress Test completed by Dr Gwenlyn Saran at [facility].

## 2011-09-27 ENCOUNTER — Encounter (HOSPITAL_COMMUNITY): Payer: Self-pay

## 2011-09-27 NOTE — Progress Notes (Signed)
Discussed with patient to take Xanax and Depakote the morning of surgery since patient takes in the am.  Patient voiced understanding.  Patient states Dr Gwenlyn Saran discussed at last visit she had with him to ? Stop taking Atenolol.  Patient asked nurse regarding stopping of medication.  I instructed patient that Dr Gwenlyn Saran had to decide the starting and stopping of Cardiac medications.  I instructed patient to call office and discuss this with him and if any medication changes occur she could inform the nurse on the morning of surgery.  Patient voiced understanding.

## 2011-09-27 NOTE — Progress Notes (Signed)
Last office visit note with Dr Gwenlyn Saran on chart from 09/25/11.

## 2011-09-28 ENCOUNTER — Encounter (INDEPENDENT_AMBULATORY_CARE_PROVIDER_SITE_OTHER): Payer: Self-pay

## 2011-10-03 ENCOUNTER — Ambulatory Visit (HOSPITAL_COMMUNITY): Payer: Medicaid Other | Admitting: Anesthesiology

## 2011-10-03 ENCOUNTER — Encounter (HOSPITAL_COMMUNITY)
Admission: RE | Disposition: A | Discharge status: Home / Self Care | Payer: Self-pay | Source: Ambulatory Visit | Attending: Surgery

## 2011-10-03 ENCOUNTER — Encounter (HOSPITAL_COMMUNITY): Payer: Self-pay | Admitting: *Deleted

## 2011-10-03 ENCOUNTER — Encounter (HOSPITAL_COMMUNITY): Payer: Self-pay | Admitting: Anesthesiology

## 2011-10-03 ENCOUNTER — Inpatient Hospital Stay (HOSPITAL_COMMUNITY)
Admission: RE | Admit: 2011-10-03 | Discharge: 2011-10-08 | DRG: 354 | Disposition: A | Discharge status: Home / Self Care | Payer: Medicaid Other | Source: Ambulatory Visit | Attending: Surgery | Admitting: Surgery

## 2011-10-03 DIAGNOSIS — G4730 Sleep apnea, unspecified: Secondary | ICD-10-CM | POA: Diagnosis present

## 2011-10-03 DIAGNOSIS — I10 Essential (primary) hypertension: Secondary | ICD-10-CM | POA: Diagnosis present

## 2011-10-03 DIAGNOSIS — Z01812 Encounter for preprocedural laboratory examination: Secondary | ICD-10-CM

## 2011-10-03 DIAGNOSIS — Z6841 Body Mass Index (BMI) 40.0 and over, adult: Secondary | ICD-10-CM

## 2011-10-03 DIAGNOSIS — E119 Type 2 diabetes mellitus without complications: Secondary | ICD-10-CM | POA: Diagnosis present

## 2011-10-03 DIAGNOSIS — K432 Incisional hernia without obstruction or gangrene: Principal | ICD-10-CM | POA: Diagnosis present

## 2011-10-03 DIAGNOSIS — F3189 Other bipolar disorder: Secondary | ICD-10-CM | POA: Diagnosis present

## 2011-10-03 DIAGNOSIS — E875 Hyperkalemia: Secondary | ICD-10-CM | POA: Diagnosis present

## 2011-10-03 DIAGNOSIS — E039 Hypothyroidism, unspecified: Secondary | ICD-10-CM | POA: Diagnosis present

## 2011-10-03 DIAGNOSIS — K439 Ventral hernia without obstruction or gangrene: Secondary | ICD-10-CM

## 2011-10-03 DIAGNOSIS — K219 Gastro-esophageal reflux disease without esophagitis: Secondary | ICD-10-CM | POA: Diagnosis present

## 2011-10-03 DIAGNOSIS — I252 Old myocardial infarction: Secondary | ICD-10-CM

## 2011-10-03 HISTORY — PX: VENTRAL HERNIA REPAIR: SHX424

## 2011-10-03 LAB — GLUCOSE, CAPILLARY: Glucose-Capillary: 155 mg/dL — ABNORMAL HIGH (ref 70–99)

## 2011-10-03 SURGERY — REPAIR, HERNIA, VENTRAL
Anesthesia: General | Wound class: Clean

## 2011-10-03 MED ORDER — OXYCODONE HCL 5 MG/5ML PO SOLN
5.0000 mg | Freq: Once | ORAL | Status: DC | PRN
  Filled 2011-10-03: qty 5

## 2011-10-03 MED ORDER — 0.9 % SODIUM CHLORIDE (POUR BTL) OPTIME
TOPICAL | Status: DC | PRN
  Administered 2011-10-03: 1000 mL

## 2011-10-03 MED ORDER — MIDAZOLAM HCL 5 MG/5ML IJ SOLN
INTRAMUSCULAR | Status: DC | PRN
  Administered 2011-10-03: 2 mg via INTRAVENOUS

## 2011-10-03 MED ORDER — PROMETHAZINE HCL 25 MG/ML IJ SOLN: 6.2500 mg | INTRAMUSCULAR | Status: DC | PRN

## 2011-10-03 MED ORDER — HYDROMORPHONE 0.3 MG/ML IV SOLN
INTRAVENOUS | Status: DC
Start: 2011-10-03 — End: 2011-10-06
  Administered 2011-10-03 (×2): 0.6 mg via INTRAVENOUS
  Administered 2011-10-03 – 2011-10-04 (×2): via INTRAVENOUS
  Administered 2011-10-04: 2.4 mg via INTRAVENOUS
  Administered 2011-10-04: 0.3 mg via INTRAVENOUS
  Administered 2011-10-05: 2.2 mg via INTRAVENOUS
  Administered 2011-10-05: 0.9 mg via INTRAVENOUS
  Administered 2011-10-05: 2.1 mg via INTRAVENOUS
  Administered 2011-10-05: 04:00:00 via INTRAVENOUS
  Administered 2011-10-05: 0.3 mg via INTRAVENOUS
  Administered 2011-10-06: 1.2 mg via INTRAVENOUS
  Administered 2011-10-06: 0.6 mg via INTRAVENOUS
  Filled 2011-10-03 (×2): qty 25

## 2011-10-03 MED ORDER — NEOSTIGMINE METHYLSULFATE 1 MG/ML IJ SOLN
INTRAMUSCULAR | Status: DC | PRN
  Administered 2011-10-03: 5 mg via INTRAVENOUS

## 2011-10-03 MED ORDER — HYDROMORPHONE HCL PF 1 MG/ML IJ SOLN
INTRAMUSCULAR | Status: AC
  Filled 2011-10-03: qty 1

## 2011-10-03 MED ORDER — FENTANYL CITRATE 0.05 MG/ML IJ SOLN
INTRAMUSCULAR | Status: DC | PRN
Start: 2011-10-03 — End: 2011-10-03
  Administered 2011-10-03 (×2): 50 ug via INTRAVENOUS
  Administered 2011-10-03: 100 ug via INTRAVENOUS

## 2011-10-03 MED ORDER — VANCOMYCIN HCL IN DEXTROSE 1-5 GM/200ML-% IV SOLN
INTRAVENOUS | Status: AC
  Filled 2011-10-03: qty 200

## 2011-10-03 MED ORDER — ACYCLOVIR 200 MG PO CAPS
400.0000 mg | ORAL_CAPSULE | Freq: Two times a day (BID) | ORAL | Status: DC
  Administered 2011-10-03 – 2011-10-08 (×10): 400 mg via ORAL
  Filled 2011-10-03 (×11): qty 2

## 2011-10-03 MED ORDER — MEPERIDINE HCL 50 MG/ML IJ SOLN: 6.2500 mg | INTRAMUSCULAR | Status: DC | PRN

## 2011-10-03 MED ORDER — HYDROMORPHONE HCL PF 1 MG/ML IJ SOLN
0.2500 mg | INTRAMUSCULAR | Status: DC | PRN
  Administered 2011-10-03 (×2): 0.5 mg via INTRAVENOUS

## 2011-10-03 MED ORDER — SODIUM CHLORIDE 0.9 % IJ SOLN: 9.0000 mL | INTRAMUSCULAR | Status: DC | PRN

## 2011-10-03 MED ORDER — GLYCOPYRROLATE 0.2 MG/ML IJ SOLN
INTRAMUSCULAR | Status: DC | PRN
  Administered 2011-10-03: 0.6 mg via INTRAVENOUS

## 2011-10-03 MED ORDER — VANCOMYCIN HCL 1000 MG IV SOLR
1500.0000 mg | INTRAVENOUS | Status: AC
  Administered 2011-10-03: 1500 mg via INTRAVENOUS
  Filled 2011-10-03: qty 1500

## 2011-10-03 MED ORDER — ACETAMINOPHEN 10 MG/ML IV SOLN
INTRAVENOUS | Status: DC | PRN
  Administered 2011-10-03: 1000 mg via INTRAVENOUS

## 2011-10-03 MED ORDER — KETOROLAC TROMETHAMINE 15 MG/ML IJ SOLN
15.0000 mg | Freq: Four times a day (QID) | INTRAMUSCULAR | Status: DC
  Administered 2011-10-03 – 2011-10-05 (×10): 15 mg via INTRAVENOUS
  Filled 2011-10-03 (×17): qty 1

## 2011-10-03 MED ORDER — ACETAMINOPHEN 10 MG/ML IV SOLN
INTRAVENOUS | Status: AC
  Filled 2011-10-03: qty 100

## 2011-10-03 MED ORDER — METOCLOPRAMIDE HCL 5 MG/ML IJ SOLN
INTRAMUSCULAR | Status: DC | PRN
  Administered 2011-10-03: 10 mg via INTRAVENOUS

## 2011-10-03 MED ORDER — ONDANSETRON HCL 4 MG/2ML IJ SOLN
INTRAMUSCULAR | Status: AC
  Filled 2011-10-03: qty 2

## 2011-10-03 MED ORDER — ONDANSETRON HCL 4 MG/2ML IJ SOLN
INTRAMUSCULAR | Status: DC | PRN
  Administered 2011-10-03: 4 mg via INTRAVENOUS

## 2011-10-03 MED ORDER — LACTATED RINGERS IV SOLN
INTRAVENOUS | Status: DC | PRN
  Administered 2011-10-03 (×2): via INTRAVENOUS

## 2011-10-03 MED ORDER — LEVOTHYROXINE SODIUM 112 MCG PO TABS
112.0000 ug | ORAL_TABLET | Freq: Every day | ORAL | Status: DC
  Administered 2011-10-04 – 2011-10-08 (×5): 112 ug via ORAL
  Filled 2011-10-03 (×6): qty 1

## 2011-10-03 MED ORDER — ENOXAPARIN SODIUM 40 MG/0.4ML ~~LOC~~ SOLN
40.0000 mg | SUBCUTANEOUS | Status: DC
  Administered 2011-10-03 – 2011-10-08 (×5): 40 mg via SUBCUTANEOUS
  Filled 2011-10-03 (×6): qty 0.4

## 2011-10-03 MED ORDER — CYCLOSPORINE 100 MG PO CAPS
200.0000 mg | ORAL_CAPSULE | Freq: Two times a day (BID) | ORAL | Status: DC
  Administered 2011-10-03 – 2011-10-08 (×10): 200 mg via ORAL
  Filled 2011-10-03 (×11): qty 2

## 2011-10-03 MED ORDER — ACETAMINOPHEN 10 MG/ML IV SOLN: 1000.0000 mg | Freq: Once | INTRAVENOUS | Status: DC | PRN

## 2011-10-03 MED ORDER — METFORMIN HCL 500 MG PO TABS
1000.0000 mg | ORAL_TABLET | Freq: Two times a day (BID) | ORAL | Status: DC
  Administered 2011-10-04 – 2011-10-08 (×8): 1000 mg via ORAL
  Filled 2011-10-03 (×11): qty 2

## 2011-10-03 MED ORDER — ONDANSETRON HCL 4 MG PO TABS: 4.0000 mg | ORAL_TABLET | Freq: Four times a day (QID) | ORAL | Status: DC | PRN

## 2011-10-03 MED ORDER — ATENOLOL 25 MG PO TABS
25.0000 mg | ORAL_TABLET | Freq: Every day | ORAL | Status: DC
  Administered 2011-10-03 – 2011-10-07 (×4): 25 mg via ORAL
  Filled 2011-10-03 (×6): qty 1

## 2011-10-03 MED ORDER — ONDANSETRON HCL 4 MG/2ML IJ SOLN
4.0000 mg | Freq: Four times a day (QID) | INTRAMUSCULAR | Status: DC | PRN
  Administered 2011-10-04 – 2011-10-05 (×4): 4 mg via INTRAVENOUS
  Filled 2011-10-03 (×4): qty 2

## 2011-10-03 MED ORDER — ROCURONIUM BROMIDE 100 MG/10ML IV SOLN
INTRAVENOUS | Status: DC | PRN
  Administered 2011-10-03: 20 mg via INTRAVENOUS
  Administered 2011-10-03: 10 mg via INTRAVENOUS
  Administered 2011-10-03: 30 mg via INTRAVENOUS
  Administered 2011-10-03: 20 mg via INTRAVENOUS
  Administered 2011-10-03: 50 mg via INTRAVENOUS
  Administered 2011-10-03: 20 mg via INTRAVENOUS

## 2011-10-03 MED ORDER — SCOPOLAMINE 1 MG/3DAYS TD PT72
MEDICATED_PATCH | TRANSDERMAL | Status: AC
  Filled 2011-10-03: qty 1

## 2011-10-03 MED ORDER — PROPOFOL INFUSION 10 MG/ML OPTIME
INTRAVENOUS | Status: DC | PRN
  Administered 2011-10-03: 100 ug/kg/min via INTRAVENOUS

## 2011-10-03 MED ORDER — NALOXONE HCL 0.4 MG/ML IJ SOLN: 0.4000 mg | INTRAMUSCULAR | Status: DC | PRN

## 2011-10-03 MED ORDER — INSULIN ASPART 100 UNIT/ML ~~LOC~~ SOLN
6.0000 [IU] | Freq: Three times a day (TID) | SUBCUTANEOUS | Status: DC
  Administered 2011-10-04 (×2): 6 [IU] via SUBCUTANEOUS

## 2011-10-03 MED ORDER — PROPOFOL 10 MG/ML IV BOLUS
INTRAVENOUS | Status: DC | PRN
  Administered 2011-10-03: 200 mg via INTRAVENOUS

## 2011-10-03 MED ORDER — DEXAMETHASONE SODIUM PHOSPHATE 4 MG/ML IJ SOLN
INTRAMUSCULAR | Status: DC | PRN
  Administered 2011-10-03: 10 mg via INTRAVENOUS

## 2011-10-03 MED ORDER — HYDROMORPHONE 0.3 MG/ML IV SOLN
INTRAVENOUS | Status: AC
  Filled 2011-10-03: qty 25

## 2011-10-03 MED ORDER — INSULIN GLARGINE 100 UNIT/ML ~~LOC~~ SOLN
10.0000 [IU] | Freq: Every day | SUBCUTANEOUS | Status: DC
  Administered 2011-10-03 – 2011-10-04 (×2): 10 [IU] via SUBCUTANEOUS

## 2011-10-03 MED ORDER — ALPRAZOLAM 0.5 MG PO TABS
0.5000 mg | ORAL_TABLET | Freq: Three times a day (TID) | ORAL | Status: DC
  Administered 2011-10-04 – 2011-10-08 (×8): 0.5 mg via ORAL
  Filled 2011-10-03 (×10): qty 1

## 2011-10-03 MED ORDER — KETOROLAC TROMETHAMINE 30 MG/ML IJ SOLN
INTRAMUSCULAR | Status: DC | PRN
  Administered 2011-10-03: 15 mg via INTRAMUSCULAR
  Administered 2011-10-03: 15 mg via INTRAVENOUS

## 2011-10-03 MED ORDER — OXYCODONE HCL 5 MG PO TABS: 5.0000 mg | ORAL_TABLET | Freq: Once | ORAL | Status: DC | PRN

## 2011-10-03 MED ORDER — KETAMINE HCL 10 MG/ML IJ SOLN
INTRAMUSCULAR | Status: DC | PRN
  Administered 2011-10-03 (×3): 2 mg via INTRAVENOUS
  Administered 2011-10-03: 25 mg via INTRAVENOUS
  Administered 2011-10-03 (×3): 2 mg via INTRAVENOUS
  Administered 2011-10-03: 1 mg via INTRAVENOUS
  Administered 2011-10-03: 2 mg via INTRAVENOUS

## 2011-10-03 MED ORDER — DIVALPROEX SODIUM ER 500 MG PO TB24
500.0000 mg | ORAL_TABLET | Freq: Two times a day (BID) | ORAL | Status: DC
  Administered 2011-10-03 – 2011-10-08 (×10): 500 mg via ORAL
  Filled 2011-10-03 (×11): qty 1

## 2011-10-03 MED ORDER — DIPHENHYDRAMINE HCL 50 MG/ML IJ SOLN: 12.5000 mg | Freq: Four times a day (QID) | INTRAMUSCULAR | Status: DC | PRN

## 2011-10-03 MED ORDER — ONDANSETRON HCL 4 MG/2ML IJ SOLN: 4.0000 mg | Freq: Four times a day (QID) | INTRAMUSCULAR | Status: DC | PRN

## 2011-10-03 MED ORDER — POTASSIUM CHLORIDE IN NACL 20-0.45 MEQ/L-% IV SOLN
INTRAVENOUS | Status: DC
  Administered 2011-10-03: 17:00:00 via INTRAVENOUS
  Administered 2011-10-04: 100 mL/h via INTRAVENOUS
  Filled 2011-10-03 (×2): qty 1000

## 2011-10-03 MED ORDER — HYDROMORPHONE HCL PF 1 MG/ML IJ SOLN
INTRAMUSCULAR | Status: DC | PRN
  Administered 2011-10-03 (×4): 0.5 mg via INTRAVENOUS

## 2011-10-03 MED ORDER — DIPHENHYDRAMINE HCL 12.5 MG/5ML PO ELIX: 12.5000 mg | ORAL_SOLUTION | Freq: Four times a day (QID) | ORAL | Status: DC | PRN

## 2011-10-03 MED ORDER — INSULIN ASPART 100 UNIT/ML ~~LOC~~ SOLN
0.0000 [IU] | Freq: Three times a day (TID) | SUBCUTANEOUS | Status: DC
  Administered 2011-10-04 (×2): 4 [IU] via SUBCUTANEOUS
  Administered 2011-10-08: 3 [IU] via SUBCUTANEOUS

## 2011-10-03 MED ORDER — GLIMEPIRIDE 4 MG PO TABS
4.0000 mg | ORAL_TABLET | Freq: Every day | ORAL | Status: DC
  Administered 2011-10-04 – 2011-10-08 (×4): 4 mg via ORAL
  Filled 2011-10-03 (×6): qty 1

## 2011-10-03 SURGICAL SUPPLY — 45 items
BINDER ABD UNIV 12 45-62 (WOUND CARE) IMPLANT
BINDER ABDOMINAL 46IN 62IN (WOUND CARE) ×4
BLADE HEX COATED 2.75 (ELECTRODE) ×2 IMPLANT
CANISTER SUCTION 2500CC (MISCELLANEOUS) ×2 IMPLANT
CATH FOLEY LATEX FREE 16FR (CATHETERS) ×1 IMPLANT
CHLORAPREP W/TINT 26ML (MISCELLANEOUS) ×2 IMPLANT
CLOTH BEACON ORANGE TIMEOUT ST (SAFETY) ×2 IMPLANT
DECANTER SPIKE VIAL GLASS SM (MISCELLANEOUS) IMPLANT
DRAIN CHANNEL 10F 3/8 F FF (DRAIN) IMPLANT
DRAIN CHANNEL 19F RND (DRAIN) ×1 IMPLANT
DRAIN CHANNEL RND F F (WOUND CARE) ×1 IMPLANT
DRAPE LAPAROSCOPIC ABDOMINAL (DRAPES) ×2 IMPLANT
DRAPE UTILITY XL STRL (DRAPES) ×2 IMPLANT
ELECT REM PT RETURN 9FT ADLT (ELECTROSURGICAL) ×2
ELECTRODE REM PT RTRN 9FT ADLT (ELECTROSURGICAL) ×1 IMPLANT
EVACUATOR SILICONE 100CC (DRAIN) ×1 IMPLANT
GLOVE BIO SURGEON STRL SZ7 (GLOVE) ×2 IMPLANT
GLOVE BIOGEL PI IND STRL 7.0 (GLOVE) ×1 IMPLANT
GLOVE BIOGEL PI IND STRL 7.5 (GLOVE) ×1 IMPLANT
GLOVE BIOGEL PI INDICATOR 7.0 (GLOVE) ×1
GLOVE BIOGEL PI INDICATOR 7.5 (GLOVE) ×2
GOWN STRL NON-REIN LRG LVL3 (GOWN DISPOSABLE) ×3 IMPLANT
GOWN STRL REIN XL XLG (GOWN DISPOSABLE) ×4 IMPLANT
KIT BASIN OR (CUSTOM PROCEDURE TRAY) ×2 IMPLANT
MESH PHYSIO OVAL 20X25CM (Mesh General) ×1 IMPLANT
NEEDLE HYPO 22GX1.5 SAFETY (NEEDLE) IMPLANT
PACK GENERAL/GYN (CUSTOM PROCEDURE TRAY) ×2 IMPLANT
RETAINER VISCERA MED (MISCELLANEOUS) ×1 IMPLANT
SPONGE GAUZE 4X4 12PLY (GAUZE/BANDAGES/DRESSINGS) ×2 IMPLANT
STAPLER VISISTAT 35W (STAPLE) ×2 IMPLANT
SUT ETHILON 2 0 PS N (SUTURE) ×3 IMPLANT
SUT NOVA 1 T20/GS 25DT (SUTURE) ×2 IMPLANT
SUT NOVA NAB GS-21 0 18 T12 DT (SUTURE) ×6 IMPLANT
SUT PDS AB 1 CTX 36 (SUTURE) IMPLANT
SUT PROLENE 0 CT 1 CR/8 (SUTURE) IMPLANT
SUT SILK 3 0 (SUTURE) ×4
SUT SILK 3-0 18XBRD TIE 12 (SUTURE) ×1 IMPLANT
SUT VIC AB 2-0 CT1 27 (SUTURE) ×4
SUT VIC AB 2-0 CT1 27XBRD (SUTURE) ×2 IMPLANT
SUT VIC AB 3-0 SH 27 (SUTURE)
SUT VIC AB 3-0 SH 27XBRD (SUTURE) ×1 IMPLANT
SYR CONTROL 10ML LL (SYRINGE) IMPLANT
TAPE CLOTH SOFT 2X10 (GAUZE/BANDAGES/DRESSINGS) ×1 IMPLANT
TOWEL OR 17X26 10 PK STRL BLUE (TOWEL DISPOSABLE) ×3 IMPLANT
TRAY FOLEY CATH 14FRSI W/METER (CATHETERS) IMPLANT

## 2011-10-03 NOTE — Anesthesia Procedure Notes (Signed)
Procedure Name: Intubation Date/Time: 10/03/2011 11:36 AM Performed by: Randon Goldsmith CATHERINE PAYNE Pre-anesthesia Checklist: Emergency Drugs available, Patient identified, Suction available and Patient being monitored Patient Re-evaluated:Patient Re-evaluated prior to inductionOxygen Delivery Method: Circle system utilized Preoxygenation: Pre-oxygenation with 100% oxygen Intubation Type: IV induction Ventilation: Mask ventilation without difficulty Laryngoscope Size: Mac and 3 Grade View: Grade I Tube type: Oral Tube size: 7.0 mm Number of attempts: 1 Airway Equipment and Method: Patient positioned with wedge pillow and Stylet Placement Confirmation: ETT inserted through vocal cords under direct vision,  positive ETCO2 and breath sounds checked- equal and bilateral Secured at: 21 cm Tube secured with: Tape Dental Injury: Teeth and Oropharynx as per pre-operative assessment

## 2011-10-03 NOTE — Anesthesia Postprocedure Evaluation (Signed)
Anesthesia Post Note  Patient: [name]  Procedure(s) Performed: Procedure(s) (LRB): HERNIA REPAIR VENTRAL ADULT (N/A) INSERTION OF MESH (N/A)  Anesthesia type: General  Patient location: PACU  Post pain: Pain level controlled  Post assessment: Post-op Vital signs reviewed  Last Vitals: BP 112/68  Pulse 81  Temp 37.2 C  Resp 13  SpO2 96%  LMP 07/22/2011  Post vital signs: Reviewed  Level of consciousness: sedated  Complications: No apparent anesthesia complications. Pt hypoventilating intermittently during PACU stay. Decision to go to stepdown with SpO2 and ETCO2 monitoring. No significant airway obstruction. Pt maintaining SpO2 on [place]. Will tx to stepdown.

## 2011-10-03 NOTE — Brief Op Note (Signed)
10/03/2011  1:36 PM  PATIENT:  [name]  36 y.o. female  PRE-OPERATIVE DIAGNOSIS:  large ventral hernia  POST-OPERATIVE DIAGNOSIS:  large ventral hernia  PROCEDURE:  Procedure(s) (LRB) with comments: HERNIA REPAIR VENTRAL ADULT (N/A) - open ventral hernia repair with mesh INSERTION OF MESH (N/A)  SURGEON:  Surgeon(s) and Role:    Wilmon Arms. [name], MD - Primary    * Robyne Askew, MD - Assisting  PHYSICIAN ASSISTANT:   ASSISTANTS: Toth   ANESTHESIA:   general  EBL:  Total I/O In: 1000 [I.V.:1000] Out: 200 [Urine:200]  BLOOD ADMINISTERED:none  DRAINS: (one) Jackson-Pratt drain(s) with closed bulb suction in the subcutaneous space and Urinary Catheter (Foley)   LOCAL MEDICATIONS USED:  NONE  SPECIMEN:  No Specimen  DISPOSITION OF SPECIMEN:  N/A  COUNTS:  YES  TOURNIQUET:  * No tourniquets in log *  DICTATION: .Dragon Dictation  PLAN OF CARE: Admit to inpatient   PATIENT DISPOSITION:  PACU - hemodynamically stable.   Delay start of Pharmacological VTE agent (>24hrs) due to surgical blood loss or risk of bleeding: no  Wilmon Arms. [name], MD, [facility]  10/03/2011 1:37 PM

## 2011-10-03 NOTE — Op Note (Signed)
Ventral Hernia Repair Procedure Note  Indications: Symptomatic large ventral incisional hernia  Pre-operative Diagnosis: Ventral hernia  Post-operative Diagnosis: Ventral hernia  Surgeon: Argentina Kosch K.   Assistants: Chevis Pretty, MD, FACS  Anesthesia: General endotracheal anesthesia  ASA Class: 3  Procedure Details  The patient was seen in the Holding Room. The risks, benefits, complications, treatment options, and expected outcomes were discussed with the patient. The possibilities of reaction to medication, pulmonary aspiration, perforation of viscus, bleeding, recurrent infection, the need for additional procedures, failure to diagnose a condition, and creating a complication requiring transfusion or operation were discussed with the patient. The patient concurred with the proposed plan, giving informed consent.  The site of surgery properly noted/marked. The patient was taken to the operating room, identified as [name] and the procedure verified as ventral hernia repair. A Time Out was held and the above information confirmed.  The patient was placed supine.  After establishing general anesthesia, the abdomen was prepped with Chloraprep and draped in sterile fashion.  The patient is morbidly obese and has a redundant pannus.  She actually has some skin breakdown in the fold between the most anterior and the next deeper layers of her pannus.  We made a vertical incision over the palpable hernia located above the umbilicus. Dissection was carried down to the hernia sac located above the fascia.  The hernia sac was opened and contained omentum and part of the transverse colon.  Intact fascia was identified circumferentially around the defect but was retracted slightly.  The fascia was mobilized by dissecting the subcutaneous tissues away anteriorly all the way around the fascial defect.  We used a 20 x 25 cm Physio mesh and secured this to the fascia with interrupted 0 Novofil  sutures.  The fascial defect was reapproximated with interrupted figure-of-8 1 Novofil sutures.  The subcutaneous tissues were irrigated.  Hemostasis was confirmed.  A 19 Fr drain was placed in the subcutaneous tissues and secured with 2-0 Ethilon.The skin incision was closed with staples.  Instrument, sponge, and needle counts were correct prior to closure and at the conclusion of the case.   Findings: 16 cm defect   Estimated Blood Loss:  less than 100 mL         Drains: JP drain in subcutaneous tissues/ Foley catheter                      Complications:  None; patient tolerated the procedure well.         Disposition: PACU - hemodynamically stable.         Condition: stable  Wilmon Arms. [name], MD, [facility]  10/03/2011 2:41 PM

## 2011-10-03 NOTE — H&P (View-Only) (Signed)
Progress Notes   Danita Taplin (MR# [id number])       Progress Notes Info             HPI [name] is a 36 y.o. female.  Referred by Dr. Weldon Inches from the ED with a ventral hernia OB/GYN - Dr. Tamela Oddi HPI 36 yo female with morbid obesity (BMI 54) and several other comorbidities presents after recent visit to the ED for abdominal pain.  She is two years s/p c-section via a long upper midline incision.  This area has become more firm and tender.  She experiences more pain when she bends over.  She has had some nausea, vomiting, and constipation.  She underwent a CT scan in the ED that showed a large ventral hernia containing bowel and colon.  She presents now for surgical evaluation.  Her previous surgery was canceled due to a skin outbreak, but she is now on Cyclosporine with significant improvement.  Past Medical History   Diagnosis  Date   .  Diabetes mellitus     .  Migraine     .  Hypoactive thyroid     .  Cancer         cervical   .  Osteoporosis     .  Stroke     Psoriasis     Past Surgical History   Procedure  Date   .  Cesarean section     .  Appendectomy     .  Lipoma         total 3   .  Neuroplasty / transposition median nerve at carpal tunnel bilateral     .  Cervical cancer           Family History   Problem  Relation  Age of Onset   .  Hypertension  Mother     .  Glaucoma  Mother     .  Hypertension  Father     .  Cancer  Father         precancerious polyps   .  Cancer  Maternal Uncle         colon        Social History History   Substance Use Topics   .  Smoking status:  Never Smoker    .  Smokeless tobacco:  Not on file   .  Alcohol Use:  No         Allergies   Allergen  Reactions   .  Cephalexin  Anaphylaxis   .  Cephalexin           Current Outpatient Prescriptions   Medication  Sig  Dispense  Refill   .  acyclovir (ZOVIRAX) 200 MG capsule  Take 200 mg by mouth 2 (two) times daily.             Marland Kitchen  atenolol-chlorthalidone (TENORETIC) 50-25 MG per tablet  Take 1 tablet by mouth daily.         Marland Kitchen  b complex vitamins capsule  Take 1 capsule by mouth daily.         Marland Kitchen  glimepiride (AMARYL) 4 MG tablet  Take 4 mg by mouth daily before breakfast.         .  insulin NPH-insulin regular (NOVOLIN 70/30) (70-30) 100 UNIT/ML injection  Inject 25 Units into the skin 2 (two) times daily with a meal.         .  levothyroxine (  SYNTHROID, LEVOTHROID) 112 MCG tablet  Take 112 mcg by mouth daily.           Marland Kitchen  lisinopril (PRINIVIL,ZESTRIL) 10 MG tablet  Take 10 mg by mouth daily.         .  metFORMIN (GLUCOPHAGE) 500 MG tablet  Take 1,000 mg by mouth 2 (two) times daily with a meal.               Review of Systems Review of Systems  Constitutional: Negative for fever, chills and unexpected weight change.  HENT: Negative for hearing loss, congestion, sore throat, trouble swallowing and voice change.   Eyes: Negative for visual disturbance.  Respiratory: Positive for cough. Negative for wheezing.   Cardiovascular: Negative for chest pain, palpitations and leg swelling.  Gastrointestinal: Positive for nausea, vomiting, abdominal pain, constipation, blood in stool and anal bleeding. Negative for diarrhea and abdominal distention.  Genitourinary: Negative for hematuria, vaginal bleeding and difficulty urinating.  Musculoskeletal: Negative for arthralgias.  Skin: Negative for rash and wound.  Neurological: Positive for weakness. Negative for seizures, syncope and headaches.  Hematological: Negative for adenopathy. Does not bruise/bleed easily.  Psychiatric/Behavioral: Negative for confusion.      Blood pressure 136/95, pulse 101, temperature 97.5 F (36.4 C), temperature source Temporal, height 5' 5.5" (1.664 m), weight 326 lb 12.8 oz (148.236 kg), last menstrual period 02/23/2011, SpO2 95.00%.   Physical Exam Physical Exam Morbidly obese female in NAD HEENT:  EOMI, sclera anicteric Neck:  No  masses, no thyromegaly Lungs:  CTA bilaterally; normal respiratory effort CV:  Regular rate and rhythm; no murmurs Abd:  +bowel sounds, soft, long upper midline incision with firmness under incision.  Abdomen is softer laterally.  The hernia is partially reducible when supine Ext:  Well-perfused; no edema Skin:  Warm, dry; no sign of jaundice   Data Reviewed CT scan -   *RADIOLOGY REPORT*    Clinical Data: 3 weeks of pain. Rule out diverticulitis.   Diabetic.   CT ABDOMEN AND PELVIS WITH CONTRAST   Technique: Multidetector CT imaging of the abdomen and pelvis was   performed following the standard protocol during bolus   administration of intravenous contrast.   Contrast: OMNIPAQUE IOHEXOL 300 MG/ML SOLN   Comparison: 04/12/2010.   Findings: Ventral hernia containing bowel without obstruction. No   extraluminal bowel inflammatory process, free fluid or free air.   Appendix not clearly delineated.   Diffuse fatty infiltration liver.   Dilated gallbladder without calcified gallstones.   Nonobstructing left lower pole renal calculi. No focal hepatic,   splenic, pancreatic, adrenal or renal lesion. No abdominal aortic   aneurysm. No bony destructive lesion.   IMPRESSION:   No bowel inflammatory process noted. Please see above.   Original Report Authenticated By: Fuller Canada, M.D.                 Assessment    Large ventral hernia containing bowel Multiple medical comorbidities Psoriasis - s/p recent outbreak, improving.   Plan    Open ventral hernia repair with mesh.  The surgical procedure has been discussed with the patient.  Potential risks, benefits, alternative treatments, and expected outcomes have been explained.  All of the patient's questions at this time have been answered.  The likelihood of reaching the patient's treatment goal is good.  Her risk of recurrence and complications is elevated due to her morbid obesity.  The patient understand the  proposed surgical procedure and wishes to proceed.  Wilmon Arms. [name], MD, [facility] Surgery  09/13/2011 10:10 AM

## 2011-10-03 NOTE — Anesthesia Preprocedure Evaluation (Addendum)
Anesthesia Evaluation  Patient identified by MRN, date of birth, ID band Patient awake    Reviewed: Allergy & Precautions, H&P , NPO status , Patient's Chart, lab work & pertinent test results, reviewed documented beta blocker date and time   History of Anesthesia Complications (+) PONV  Airway Mallampati: I TM Distance: >3 FB Neck ROM: Full    Dental  (+) Teeth Intact and Dental Advisory Given   Pulmonary shortness of breath and with exertion, sleep apnea ,  breath sounds clear to auscultation  Pulmonary exam normal       Cardiovascular hypertension, Pt. on medications and Pt. on home beta blockers - angina- Past MI and - CHF - dysrhythmias + Valvular Problems/Murmurs MR and MVP Rhythm:Regular Rate:Normal     Neuro/Psych  Headaches, PSYCHIATRIC DISORDERS Anxiety Depression Bipolar Disorder CVA, No Residual Symptoms    GI/Hepatic GERD-  Medicated and Controlled,Fatty liver    Endo/Other  diabetes, Type 2, Insulin Dependent and Oral Hypoglycemic AgentsHypothyroidism   Renal/GU Renal disease     Musculoskeletal   Abdominal (+) + obese,  Abdomen: soft.    Peds  Hematology  (+) Blood dyscrasia, anemia ,   Anesthesia Other Findings   Reproductive/Obstetrics                        Anesthesia Physical Anesthesia Plan  ASA: IV  Anesthesia Plan: General   Post-op Pain Management:    Induction: Intravenous  Airway Management Planned: Oral ETT  Additional Equipment:   Intra-op Plan:   Post-operative Plan: Extubation in OR  Informed Consent: I have reviewed the patients History and Physical, chart, labs and discussed the procedure including the risks, benefits and alternatives for the proposed anesthesia with the patient or authorized representative who has indicated his/her understanding and acceptance.   Dental advisory given  Plan Discussed with: CRNA and Surgeon  Anesthesia Plan  Comments:         Anesthesia Quick Evaluation

## 2011-10-03 NOTE — Transfer of Care (Signed)
Immediate Anesthesia Transfer of Care Note  Patient: [name]  Procedure(s) Performed: Procedure(s) (LRB) with comments: HERNIA REPAIR VENTRAL ADULT (N/A) - open ventral hernia repair with mesh INSERTION OF MESH (N/A)  Patient Location: PACU  Anesthesia Type: General  Level of Consciousness: awake, alert , oriented, patient cooperative and responds to stimulation  Airway & Oxygen Therapy: Patient Spontanous Breathing and Patient connected to face mask oxygen  Post-op Assessment: Report given to PACU RN, Post -op Vital signs reviewed and stable and Patient moving all extremities  Post vital signs: Reviewed and stable  Complications: No apparent anesthesia complications

## 2011-10-03 NOTE — Interval H&P Note (Signed)
History and Physical Interval Note:  10/03/2011 10:54 AM  [name]  has presented today for surgery, with the diagnosis of large ventral hernia  The various methods of treatment have been discussed with the patient and family. After consideration of risks, benefits and other options for treatment, the patient has consented to  Procedure(s) (LRB) with comments: HERNIA REPAIR VENTRAL ADULT (N/A) - open ventral hernia repair with mesh INSERTION OF MESH (N/A) as a surgical intervention .  The patient's history has been reviewed, patient examined, no change in status, stable for surgery.  I have reviewed the patient's chart and labs.  Questions were answered to the patient's satisfaction.     Mikea Quadros K.

## 2011-10-03 NOTE — Preoperative (Signed)
Beta Blockers   Reason not to administer Beta Blockers:Not Applicable, took BB this am 

## 2011-10-04 LAB — CBC
MCH: 29.7 pg (ref 26.0–34.0)
MCHC: 32.5 g/dL (ref 30.0–36.0)
Platelets: 263 10*3/uL (ref 150–400)
RBC: 4.21 MIL/uL (ref 3.87–5.11)

## 2011-10-04 LAB — BASIC METABOLIC PANEL
CO2: 26 mEq/L (ref 19–32)
CO2: 28 mEq/L (ref 19–32)
Calcium: 8.3 mg/dL — ABNORMAL LOW (ref 8.4–10.5)
Calcium: 8.3 mg/dL — ABNORMAL LOW (ref 8.4–10.5)
GFR calc non Af Amer: 85 mL/min — ABNORMAL LOW (ref >90)
GFR calc non Af Amer: 78 mL/min — ABNORMAL LOW (ref >90)
Glucose, Bld: 149 mg/dL — ABNORMAL HIGH (ref 70–99)
Potassium: 4.4 mEq/L (ref 3.5–5.1)
Sodium: 133 mEq/L — ABNORMAL LOW (ref 135–145)
Sodium: 136 mEq/L (ref 135–145)

## 2011-10-04 LAB — GLUCOSE, CAPILLARY
Glucose-Capillary: 199 mg/dL — ABNORMAL HIGH (ref 70–99)
Glucose-Capillary: 192 mg/dL — ABNORMAL HIGH (ref 70–99)
Glucose-Capillary: 99 mg/dL (ref 70–99)

## 2011-10-04 MED ORDER — DEXTROSE-NACL 5-0.45 % IV SOLN
INTRAVENOUS | Status: DC
  Administered 2011-10-04: 06:00:00 via INTRAVENOUS

## 2011-10-04 MED ORDER — SODIUM CHLORIDE 0.9 % IV SOLN
INTRAVENOUS | Status: DC
  Administered 2011-10-04 (×2): 125 mL via INTRAVENOUS
  Administered 2011-10-05 (×4): via INTRAVENOUS

## 2011-10-04 MED ORDER — FUROSEMIDE 10 MG/ML IJ SOLN
20.0000 mg | Freq: Once | INTRAMUSCULAR | Status: AC
  Administered 2011-10-04: 20 mg via INTRAVENOUS
  Filled 2011-10-04: qty 2

## 2011-10-04 MED ORDER — ALUM & MAG HYDROXIDE-SIMETH 200-200-20 MG/5ML PO SUSP
30.0000 mL | ORAL | Status: DC | PRN
  Administered 2011-10-04 – 2011-10-05 (×2): 30 mL via ORAL
  Filled 2011-10-04 (×2): qty 30

## 2011-10-04 MED ORDER — SODIUM POLYSTYRENE SULFONATE 15 GM/60ML PO SUSP
30.0000 g | Freq: Once | ORAL | Status: AC
  Administered 2011-10-04: 30 g via RECTAL
  Filled 2011-10-04: qty 120

## 2011-10-04 MED ORDER — DOCUSATE SODIUM 100 MG PO CAPS
100.0000 mg | ORAL_CAPSULE | Freq: Two times a day (BID) | ORAL | Status: DC | PRN
  Administered 2011-10-04: 100 mg via ORAL
  Filled 2011-10-04 (×2): qty 1

## 2011-10-04 NOTE — Progress Notes (Addendum)
Admitted for hernia repair.  Has significant history of diabetes.  Home diabetes medications are as follows:  70/30 insulin- 35 units bid with meals Metformin 1000 mg bid Amaryl 4 mg daily  Patient well controlled at home as evidenced by A1c of 5.8% (10/03/11).  Noted patient started on Lantus 10 units QHS + Novolog SSI + Novolog 6 units tid with meals (meal coverage).  Patient switched to Carbohydrate Modified diet this afternoon.  If PO intake OK, may want to switch patient back to 70/30 insulin bid with meals.  Could start with 1/2 her home doses, and titrate as needed.  Recommend the following: 1. D/C Lantus 2. D/C Novolog 6 units tid with meals (meal coverage) 3. Continue Novolog Resistant SSI 4. Start 1/2 home dose 70/30 insulin- 15 units bid with breakfast and supper  Will follow. Ambrose Finland RN, MSN, CDE Diabetes Coordinator Inpatient Diabetes Program [phone]

## 2011-10-04 NOTE — Progress Notes (Signed)
1 Day Post-Op  Subjective: Patient in step-down for respiratory status - stable overnight. Hyperkalemia 6.0 on BMET this morning Making urine, but urine looks dark Very sore - using PCA  Objective: Vital signs in last 24 hours: Temp:  [97.4 F (36.3 C)-99.1 F (37.3 C)] 98.2 F (36.8 C) (09/12 0800) Pulse Rate:  [55-85] 55  (09/12 0700) Resp:  [10-20] 15  (09/12 0700) BP: (90-159)/(51-90) 111/55 mmHg (09/12 0700) SpO2:  [89 %-98 %] 91 % (09/12 0700)    Intake/Output from previous day: 09/11 0701 - 09/12 0700 In: 3150 [I.V.:3150] Out: 1190 [Urine:1075; Drains:115] Intake/Output this shift:    General appearance: alert, cooperative and no distress GI: soft, tender across abdomen;  Drain - serosanguinous drainage  Lab Results:   Basename 10/04/11 0310  WBC 15.7*  HGB 12.5  HCT 38.5  PLT 263   BMET  Basename 10/04/11 0310  NA 133*  K 6.0*  CL 99  CO2 26  GLUCOSE 217*  BUN 19  CREATININE 0.86  CALCIUM 8.3*   PT/INR No results found for this basename: LABPROT:2,INR:2 in the last 72 hours ABG No results found for this basename: PHART:2,PCO2:2,PO2:2,HCO3:2 in the last 72 hours  Studies/Results: No results found.  Anti-infectives: Anti-infectives     Start     Dose/Rate Route Frequency Ordered Stop   10/03/11 2200   acyclovir (ZOVIRAX) 200 MG capsule 400 mg        400 mg Oral 2 times daily 10/03/11 1705     10/03/11 0824   vancomycin (VANCOCIN) 1,500 mg in sodium chloride 0.9 % 500 mL IVPB        1,500 mg 250 mL/hr over 120 Minutes Intravenous 120 min pre-op 10/03/11 0824 10/03/11 1110          Assessment/Plan: s/p Procedure(s) (LRB) with comments: HERNIA REPAIR VENTRAL ADULT (N/A) - open ventral hernia repair with mesh INSERTION OF MESH (N/A) Leave in step-down for one more day - watch K, UOP Leave Foley in until tomorrow. OOB to chair Lasix x 1 dose - Kayexalate; repeat BMET later today   LOS: 1 day    Sachin Ferencz K. 10/04/2011

## 2011-10-04 NOTE — Progress Notes (Signed)
eLink Physician-Brief Progress Note Patient Name: [name] DOB: 06-Mar-1975 MRN: [id number]  Date of Service  10/04/2011   HPI/Events of Note  Desatn during sleep Also on pain meds   eICU Interventions  Empiric autocpap during sleep Reassess need as outpt   Intervention Category Intermediate Interventions: Other:  Adison Reifsteck V. 10/04/2011, 3:28 PM

## 2011-10-04 NOTE — Progress Notes (Signed)
Met patient's pastor, Celesta Gentile from [place]. [place] Mode [employer] in [place] [phone]). Also talked with patient and her mother. Patient is [place] [employer]. Mother works on [employer]. Listening, presence, support.  10/04/11 1200  Clinical Encounter Type  Visited With Patient and family together  Visit Type Initial;Spiritual support  Recommendations Follow up as needed

## 2011-10-05 ENCOUNTER — Encounter (HOSPITAL_COMMUNITY): Payer: Self-pay | Admitting: Surgery

## 2011-10-05 LAB — GLUCOSE, CAPILLARY
Glucose-Capillary: 91 mg/dL (ref 70–99)
Glucose-Capillary: 106 mg/dL — ABNORMAL HIGH (ref 70–99)
Glucose-Capillary: 101 mg/dL — ABNORMAL HIGH (ref 70–99)

## 2011-10-05 LAB — CBC
HCT: 34.5 % — ABNORMAL LOW (ref 36.0–46.0)
Hemoglobin: 10.9 g/dL — ABNORMAL LOW (ref 12.0–15.0)
MCH: 29.6 pg (ref 26.0–34.0)
MCV: 93.8 fL (ref 78.0–100.0)
RBC: 3.68 MIL/uL — ABNORMAL LOW (ref 3.87–5.11)
WBC: 16.2 10*3/uL — ABNORMAL HIGH (ref 4.0–10.5)

## 2011-10-05 LAB — BASIC METABOLIC PANEL
CO2: 27 mEq/L (ref 19–32)
Calcium: 8.3 mg/dL — ABNORMAL LOW (ref 8.4–10.5)
Chloride: 100 mEq/L (ref 96–112)
Creatinine, Ser: 1.13 mg/dL — ABNORMAL HIGH (ref 0.50–1.10)
Glucose, Bld: 109 mg/dL — ABNORMAL HIGH (ref 70–99)

## 2011-10-05 MED ORDER — PNEUMOCOCCAL VAC POLYVALENT 25 MCG/0.5ML IJ INJ
0.5000 mL | INJECTION | INTRAMUSCULAR | Status: AC
  Administered 2011-10-05: 0.5 mL via INTRAMUSCULAR
  Filled 2011-10-05: qty 0.5

## 2011-10-05 MED ORDER — POLYETHYLENE GLYCOL 3350 17 G PO PACK
17.0000 g | PACK | Freq: Once | ORAL | Status: DC
  Filled 2011-10-05: qty 1

## 2011-10-05 MED ORDER — INSULIN ASPART PROT & ASPART (70-30 MIX) 100 UNIT/ML ~~LOC~~ SUSP
15.0000 [IU] | Freq: Two times a day (BID) | SUBCUTANEOUS | Status: DC
  Administered 2011-10-05 – 2011-10-08 (×6): 15 [IU] via SUBCUTANEOUS
  Filled 2011-10-05: qty 3

## 2011-10-05 NOTE — Progress Notes (Signed)
2310-Pt placed on Auto-CPAP via FFM due to being on the ETCO2 monitor.  Pt is getting 2 LPM O2.  Pt tolerating well at this time, RT to monitor and assess as needed

## 2011-10-05 NOTE — Plan of Care (Signed)
Problem: Phase II Progression Outcomes Goal: Pain controlled Outcome: Progressing Using PCA, teaching completed.

## 2011-10-05 NOTE — Progress Notes (Signed)
2 Days Post-Op  Subjective: Sore; complaining of some nausea; reports flatus, but no BM Some desats when sleeping  Objective: Vital signs in last 24 hours: Temp:  [97.7 F (36.5 C)-98.6 F (37 C)] 98 F (36.7 C) (09/13 0338) Pulse Rate:  [52-71] 57  (09/13 0338) Resp:  [12-22] 12  (09/13 0529) BP: (72-123)/(46-84) 121/46 mmHg (09/13 0338) SpO2:  [94 %-99 %] 97 % (09/13 0529) Weight:  [346 lb 12.5 oz (157.3 kg)] 346 lb 12.5 oz (157.3 kg) (09/13 0400)    Intake/Output from previous day: 09/12 0701 - 09/13 0700 In: 3785 [P.O.:960; I.V.:2825] Out: 1300 [Urine:1125; Drains:175] Intake/Output this shift:    WDWN in NAD Lungs - CTA B CV - RRR Abd - distended; occasional bowel sounds; Incision c/d/i Drain - 175 cc serosanguinous output   Lab Results:   Basename 10/05/11 0314 10/04/11 0310  WBC 16.2* 15.7*  HGB 10.9* 12.5  HCT 34.5* 38.5  PLT 243 263   BMET  Basename 10/05/11 0314 10/04/11 1415  NA 136 136  K 4.3 4.4  CL 100 102  CO2 27 28  GLUCOSE 109* 149*  BUN 30* 21  CREATININE 1.13* 0.93  CALCIUM 8.3* 8.3*   PT/INR No results found for this basename: LABPROT:2,INR:2 in the last 72 hours ABG No results found for this basename: PHART:2,PCO2:2,PO2:2,HCO3:2 in the last 72 hours  Studies/Results: No results found.  Anti-infectives: Anti-infectives     Start     Dose/Rate Route Frequency Ordered Stop   10/03/11 2200   acyclovir (ZOVIRAX) 200 MG capsule 400 mg        400 mg Oral 2 times daily 10/03/11 1705     10/03/11 0824   vancomycin (VANCOCIN) 1,500 mg in sodium chloride 0.9 % 500 mL IVPB        1,500 mg 250 mL/hr over 120 Minutes Intravenous 120 min pre-op 10/03/11 0824 10/03/11 1110          Assessment/Plan: s/p Procedure(s) (LRB) with comments: HERNIA REPAIR VENTRAL ADULT (N/A) - open ventral hernia repair with mesh INSERTION OF MESH (N/A) Transfer to floor Miralax Full liquids until she is having bowel movements Physical therapy for  mobilization  LOS: 2 days    [name] K. 10/05/2011

## 2011-10-05 NOTE — Evaluation (Signed)
Physical Therapy Evaluation Patient Details Name: [name] MRN: [id number] DOB: 10-13-1975 Today's Date: 10/05/2011 Time: 1210-1240 PT Time Calculation (min): 30 min  PT Assessment / Plan / Recommendation Clinical Impression  36 y.o. female s/p repair of large ventral hernia.  Pt is POD#2 and ambulated 52' with RW and 2L O2.  Pt reporting 8/10 pain at incision with ambulation, she used PCA prior to walking. Encouraged frequent mobility, instructed pt in abdominal bracing. May need bariatric RW for home, depending on progress.     PT Assessment  Patient needs continued PT services    Follow Up Recommendations  No PT follow up    Barriers to Discharge None      Equipment Recommendations  Rolling walker with 5" wheels    Recommendations for Other Services     Frequency Min 3X/week    Precautions / Restrictions Precautions Precaution Comments: abdominal binder Restrictions Weight Bearing Restrictions: No   Pertinent Vitals/Pain *8/10 abdominal incision pain SaO2 92-95% while walking with 2L O2**      Mobility  Bed Mobility Bed Mobility: Rolling Left;Left Sidelying to Sit Rolling Left: 5: Supervision;With rail Left Sidelying to Sit: 5: Supervision;With rails Details for Bed Mobility Assistance: VCs for log roll, to push up with hands Transfers Transfers: Sit to Stand;Stand to Sit Sit to Stand: 5: Supervision;From bed Stand to Sit: 5: Supervision;To chair/3-in-1 Details for Transfer Assistance: VCs hand placement Ambulation/Gait Ambulation/Gait Assistance: 4: Min guard Ambulation Distance (Feet): 70 Feet Assistive device: Rolling walker Ambulation/Gait Assistance Details: pt tends to hold her breath. VCs for pursed lip breathing.  Pt ambulated with 2L O2, SaO2 93-96% while walking.  Gait Pattern: Step-through pattern General Gait Details: VCs for flexed neck    Exercises     PT Diagnosis: Acute pain;Difficulty walking  PT Problem List: Decreased  activity tolerance;Cardiopulmonary status limiting activity;Pain;Decreased mobility PT Treatment Interventions: Gait training;Stair training;Patient/family education;Functional mobility training   PT Goals Acute Rehab PT Goals PT Goal Formulation: With patient Time For Goal Achievement: 10/12/11 Potential to Achieve Goals: Good Pt will go Supine/Side to Sit: with modified independence;with HOB not 0 degrees (comment degree) PT Goal: Supine/Side to Sit - Progress: Goal set today Pt will go Sit to Stand: with modified independence;with upper extremity assist PT Goal: Sit to Stand - Progress: Goal set today Pt will Ambulate: 51 - 150 feet;with modified independence;with least restrictive assistive device PT Goal: Ambulate - Progress: Goal set today Pt will Go Up / Down Stairs: 3-5 stairs;with min assist;with rail(s) PT Goal: Up/Down Stairs - Progress: Goal set today  Visit Information  Last PT Received On: 10/05/11 Assistance Needed: +2 (+2 for managing O2, drain, and IV)    Subjective Data  Subjective: The pain is really bad.  Patient Stated Goal: be able to walk to the park with 2 y.o. son   Prior Functioning  Home Living Lives With: Family;Spouse (mother, children) Available Help at Discharge: Family Type of Home: House Home Access: Stairs to enter Secretary/administrator of Steps: 4 Home Layout: Two level Alternate Level Stairs-Number of Steps: 14 Alternate Level Stairs-Rails: Right Prior Function Level of Independence: Independent Able to Take Stairs?: Yes Driving: Yes Vocation: Unemployed Communication Communication: No difficulties    Cognition  Overall Cognitive Status: Appears within functional limits for tasks assessed/performed Arousal/Alertness: Awake/alert Orientation Level: Appears intact for tasks assessed Behavior During Session: [facility] for tasks performed    Extremity/Trunk Assessment Right Upper Extremity Assessment RUE ROM/Strength/Tone: [facility] for tasks  assessed Left Upper Extremity  Assessment LUE ROM/Strength/Tone: WFL for tasks assessed Right Lower Extremity Assessment RLE ROM/Strength/Tone: Within functional levels RLE Sensation: WFL - Light Touch RLE Coordination: WFL - gross/fine motor Left Lower Extremity Assessment LLE ROM/Strength/Tone: Within functional levels LLE Sensation: WFL - Light Touch LLE Coordination: WFL - gross/fine motor Trunk Assessment Trunk Assessment: Normal   Balance Balance Balance Assessed: Yes Static Sitting Balance Static Sitting - Balance Support: Bilateral upper extremity supported;Feet supported Static Sitting - Level of Assistance: 6: Modified independent (Device/Increase time) Static Sitting - Comment/# of Minutes: 3  End of Session PT - End of Session Equipment Utilized During Treatment: Other (comment) (abdominal binder) Activity Tolerance: Patient limited by pain Patient left: in chair;with call bell/phone within reach;with family/visitor present Nurse Communication: Mobility status  GP     Sarita, Hakanson Kistler 10/05/2011, 1:36 PM [phone]

## 2011-10-06 DIAGNOSIS — I10 Essential (primary) hypertension: Secondary | ICD-10-CM

## 2011-10-06 DIAGNOSIS — E119 Type 2 diabetes mellitus without complications: Secondary | ICD-10-CM | POA: Diagnosis present

## 2011-10-06 LAB — BASIC METABOLIC PANEL
CO2: 29 mEq/L (ref 19–32)
Calcium: 8.2 mg/dL — ABNORMAL LOW (ref 8.4–10.5)
GFR calc non Af Amer: 86 mL/min — ABNORMAL LOW (ref >90)
Glucose, Bld: 78 mg/dL (ref 70–99)
Potassium: 3.9 mEq/L (ref 3.5–5.1)
Sodium: 139 mEq/L (ref 135–145)

## 2011-10-06 LAB — GLUCOSE, CAPILLARY
Glucose-Capillary: 67 mg/dL — ABNORMAL LOW (ref 70–99)
Glucose-Capillary: 95 mg/dL (ref 70–99)
Glucose-Capillary: 112 mg/dL — ABNORMAL HIGH (ref 70–99)
Glucose-Capillary: 131 mg/dL — ABNORMAL HIGH (ref 70–99)

## 2011-10-06 LAB — CBC
HCT: 32.3 % — ABNORMAL LOW (ref 36.0–46.0)
Hemoglobin: 10.3 g/dL — ABNORMAL LOW (ref 12.0–15.0)
MCH: 29.7 pg (ref 26.0–34.0)
MCHC: 31.9 g/dL (ref 30.0–36.0)
RBC: 3.47 MIL/uL — ABNORMAL LOW (ref 3.87–5.11)

## 2011-10-06 MED ORDER — GLUCOSE-VITAMIN C 4-6 GM-MG PO CHEW: 4.0000 | CHEWABLE_TABLET | ORAL | Status: DC | PRN

## 2011-10-06 MED ORDER — GLUCOSE-VITAMIN C 4-6 GM-MG PO CHEW
CHEWABLE_TABLET | ORAL | Status: AC
  Administered 2011-10-06: 4
  Filled 2011-10-06: qty 1

## 2011-10-06 MED ORDER — GLUCOSE 40 % PO GEL: 1.0000 | ORAL | Status: DC | PRN

## 2011-10-06 MED ORDER — ONDANSETRON HCL 4 MG PO TABS: 4.0000 mg | ORAL_TABLET | ORAL | Status: DC | PRN

## 2011-10-06 MED ORDER — IBUPROFEN 600 MG PO TABS
600.0000 mg | ORAL_TABLET | Freq: Four times a day (QID) | ORAL | Status: DC
  Administered 2011-10-06 – 2011-10-08 (×9): 600 mg via ORAL
  Filled 2011-10-06 (×13): qty 1

## 2011-10-06 MED ORDER — HYDROCODONE-ACETAMINOPHEN 5-325 MG PO TABS
1.0000 | ORAL_TABLET | ORAL | Status: DC | PRN
  Administered 2011-10-06 – 2011-10-08 (×6): 2 via ORAL
  Filled 2011-10-06 (×6): qty 2

## 2011-10-06 MED ORDER — OXYCODONE-ACETAMINOPHEN 5-325 MG PO TABS
1.0000 | ORAL_TABLET | ORAL | Status: DC | PRN
  Administered 2011-10-06 (×2): 2 via ORAL
  Filled 2011-10-06 (×2): qty 2

## 2011-10-06 MED ORDER — GLUCOSE 40 % PO GEL
ORAL | Status: AC
  Filled 2011-10-06: qty 1

## 2011-10-06 MED ORDER — OXYCODONE-ACETAMINOPHEN 5-325 MG PO TABS
1.0000 | ORAL_TABLET | ORAL | Status: DC | PRN
  Administered 2011-10-06: 1 via ORAL
  Filled 2011-10-06: qty 1

## 2011-10-06 NOTE — Progress Notes (Signed)
3 Days Post-Op  Subjective: IV is out and IV team having difficulty putting one back in.  Walked.  Passing a lot of gas.  Objective: Vital signs in last 24 hours: Temp:  [97.4 F (36.3 C)-97.8 F (36.6 C)] 97.4 F (36.3 C) (09/14 0510) Pulse Rate:  [64-77] 77  (09/14 0510) Resp:  [12-24] 18  (09/14 0510) BP: (97-155)/(48-86) 155/80 mmHg (09/14 0510) SpO2:  [96 %-99 %] 96 % (09/14 0510) Last BM Date: 10/03/11  Intake/Output from previous day: 09/13 0701 - 09/14 0700 In: 2870 [P.O.:120; I.V.:2750] Out: 1060 [Urine:850; Drains:210] Intake/Output this shift:    PE: Abd-soft, obese, incision clean and intact, serosanguinous drain output  Lab Results:   Basename 10/06/11 0420 10/05/11 0314  WBC 7.7 16.2*  HGB 10.3* 10.9*  HCT 32.3* 34.5*  PLT 181 243   BMET  Basename 10/06/11 0420 10/05/11 0314  NA 139 136  K 3.9 4.3  CL 105 100  CO2 29 27  GLUCOSE 78 109*  BUN 27* 30*  CREATININE 0.85 1.13*  CALCIUM 8.2* 8.3*   PT/INR No results found for this basename: LABPROT:2,INR:2 in the last 72 hours Comprehensive Metabolic Panel:    Component Value Date/Time   NA 139 10/06/2011 0420   K 3.9 10/06/2011 0420   CL 105 10/06/2011 0420   CO2 29 10/06/2011 0420   BUN 27* 10/06/2011 0420   CREATININE 0.85 10/06/2011 0420   GLUCOSE 78 10/06/2011 0420   CALCIUM 8.2* 10/06/2011 0420   AST 32 05/01/2011 1940   ALT 44* 05/01/2011 1940   ALKPHOS 82 05/01/2011 1940   BILITOT 0.6 05/01/2011 1940   PROT 7.5 05/01/2011 1940   ALBUMIN 3.8 05/01/2011 1940     Studies/Results: No results found.  Anti-infectives: Anti-infectives     Start     Dose/Rate Route Frequency Ordered Stop   10/03/11 2200   acyclovir (ZOVIRAX) 200 MG capsule 400 mg        400 mg Oral 2 times daily 10/03/11 1705     10/03/11 0824   vancomycin (VANCOCIN) 1,500 mg in sodium chloride 0.9 % 500 mL IVPB        1,500 mg 250 mL/hr over 120 Minutes Intravenous 120 min pre-op 10/03/11 0824 10/03/11 1110           Assessment Principal Problem:  *Ventral hernia s/p  repair with mesh 10/03/11 Active Problems:  OBESITY, MORBID  BIPOLAR II DISORDER  HYPERTENSION  SLEEP APNEA  DM type 2 (diabetes mellitus, type 2)    LOS: 3 days   Plan: Try oral analgesics and if they do not control the pain, she will need a PICC.  Advance diet.   Paislea Hatton J 10/06/2011

## 2011-10-06 NOTE — Progress Notes (Signed)
Reported off to Demetrios Isaacs RN

## 2011-10-06 NOTE — Progress Notes (Signed)
CBG: 67   Treatment: 3 glucose tabs  Symptoms: None  Follow-up CBG: Time:845 CBG Result:96  Possible Reasons for Event: Inadequate meal intake  Comments/MD notified:Rosenbower    Rica Records

## 2011-10-07 LAB — GLUCOSE, CAPILLARY
Glucose-Capillary: 105 mg/dL — ABNORMAL HIGH (ref 70–99)
Glucose-Capillary: 120 mg/dL — ABNORMAL HIGH (ref 70–99)

## 2011-10-07 MED ORDER — MAGNESIUM HYDROXIDE 400 MG/5ML PO SUSP
30.0000 mL | Freq: Two times a day (BID) | ORAL | Status: DC
  Administered 2011-10-07 – 2011-10-08 (×3): 30 mL via ORAL
  Filled 2011-10-07 (×3): qty 30

## 2011-10-07 NOTE — Progress Notes (Signed)
4 Days Post-Op  Subjective: Still requiring assistance to get out of bed.  Pain is better.  No BM yet.  Objective: Vital signs in last 24 hours: Temp:  [97.7 F (36.5 C)-97.8 F (36.6 C)] 97.7 F (36.5 C) (09/15 0530) Pulse Rate:  [57-66] 57  (09/15 0530) Resp:  [16] 16  (09/15 0530) BP: (126-133)/(70-73) 132/70 mmHg (09/15 0530) SpO2:  [93 %-97 %] 93 % (09/15 0530) Last BM Date: 10/03/11  Intake/Output from previous day: 09/14 0701 - 09/15 0700 In: 1490.8 [P.O.:720; I.V.:770.8] Out: 2525 [Urine:2375; Drains:150] Intake/Output this shift:    PE: Abd-soft, obese, incision clean and intact, serosanguinous drain output  Lab Results:   Basename 10/06/11 0420 10/05/11 0314  WBC 7.7 16.2*  HGB 10.3* 10.9*  HCT 32.3* 34.5*  PLT 181 243   BMET  Basename 10/06/11 0420 10/05/11 0314  NA 139 136  K 3.9 4.3  CL 105 100  CO2 29 27  GLUCOSE 78 109*  BUN 27* 30*  CREATININE 0.85 1.13*  CALCIUM 8.2* 8.3*   PT/INR No results found for this basename: LABPROT:2,INR:2 in the last 72 hours Comprehensive Metabolic Panel:    Component Value Date/Time   NA 139 10/06/2011 0420   K 3.9 10/06/2011 0420   CL 105 10/06/2011 0420   CO2 29 10/06/2011 0420   BUN 27* 10/06/2011 0420   CREATININE 0.85 10/06/2011 0420   GLUCOSE 78 10/06/2011 0420   CALCIUM 8.2* 10/06/2011 0420   AST 32 05/01/2011 1940   ALT 44* 05/01/2011 1940   ALKPHOS 82 05/01/2011 1940   BILITOT 0.6 05/01/2011 1940   PROT 7.5 05/01/2011 1940   ALBUMIN 3.8 05/01/2011 1940     Studies/Results: No results found.  Anti-infectives: Anti-infectives     Start     Dose/Rate Route Frequency Ordered Stop   10/03/11 2200   acyclovir (ZOVIRAX) 200 MG capsule 400 mg        400 mg Oral 2 times daily 10/03/11 1705     10/03/11 0824   vancomycin (VANCOCIN) 1,500 mg in sodium chloride 0.9 % 500 mL IVPB        1,500 mg 250 mL/hr over 120 Minutes Intravenous 120 min pre-op 10/03/11 0824 10/03/11 1110          Assessment Principal  Problem:  *Ventral hernia s/p  repair with mesh 10/03/11-not independent yet; pain controlled with oral analgesics. Active Problems:  OBESITY, MORBID  BIPOLAR II DISORDER  HYPERTENSION  SLEEP APNEA  DM type 2 (diabetes mellitus, type 2)    LOS: 4 days   Plan:  Milk of Magnesia.  Home when she is more independent.   Zubair Lofton J 10/07/2011

## 2011-10-07 NOTE — Progress Notes (Signed)
Patient was complaining of itching while taking percocet. MD called to see about switching pain medicine. Rosenbower suggested Vicodin 1-2 tabs and verified that benadryl was a standing order. Patient notified of changes.

## 2011-10-08 LAB — GLUCOSE, CAPILLARY: Glucose-Capillary: 83 mg/dL (ref 70–99)

## 2011-10-08 MED ORDER — POLYETHYLENE GLYCOL 3350 17 G PO PACK
17.0000 g | PACK | Freq: Two times a day (BID) | ORAL | Status: DC
  Administered 2011-10-08: 17 g via ORAL
  Filled 2011-10-08 (×2): qty 1

## 2011-10-08 MED ORDER — HYDROCODONE-ACETAMINOPHEN 5-325 MG PO TABS: 1.0000 | ORAL_TABLET | ORAL | Status: DC | PRN

## 2011-10-08 NOTE — Care Management Note (Signed)
    Page 1 of 2   10/08/2011     2:47:08 PM   CARE MANAGEMENT NOTE 10/08/2011  Patient:  [name], [name]   Account Number:  [id number]  Date Initiated:  10/04/2011  Documentation initiated by:  DAVIS,RHONDA  Subjective/Objective Assessment:   large ventral hernia repair, pt hypoxia after or required resp support in pacu     Action/Plan:   lives at home.   Anticipated DC Date:  10/07/2011   Anticipated DC Plan:  HOME/SELF CARE  In-house referral  NA      DC Planning Services  NA      PAC Choice  NA   Choice offered to / List presented to:  NA   DME arranged  3-N-1  Levan Hurst      DME agency  [facility]     [facility] arranged  NA      HH agency  NA   Status of service:  Completed, signed off Medicare Important Message given?  NA - LOS <3 / Initial given by admissions (If response is "NO", the following Medicare IM given date fields will be blank) Date Medicare IM given:   Date Additional Medicare IM given:    Discharge Disposition:  HOME/SELF CARE  Per UR Regulation:  Reviewed for med. necessity/level of care/duration of stay  If discussed at Long Length of Stay Meetings, dates discussed:    Comments:  [id number] Earlene Plater, RN, BSN, CCM: CHART REVIEWED AND UPDATED. NO DISCHARGE NEEDS PRESENT AT THIS TIME. CASE MANAGEMENT [phone]

## 2011-10-08 NOTE — Consult Note (Signed)
WOC consult Note Reason for Consult:Intertriginous skin irritation beneath pannus (erythema, no breakdown) Wound type:Moisture associated skin damage (MASD), specifically, intertriginous dermatitis (ITD) Pressure Ulcer POA: No Periwound: Linear erythema beneath panus.  Moisture is relative to heat, perspiration, not exudate Dressing procedure/placement/frequency:Will suggest an antimicrobial textile (InterDry Ag+) for the intertriginous dermatitis.  Instructions for use for nursing provided with the order. I will not follow.  Please re-consult if needed. Thanks, Ladona Mow, MSN, RN, [facility], CWOCN [phone])

## 2011-10-08 NOTE — Progress Notes (Signed)
5 Days Post-Op  Subjective: Pain under better control Still having difficulty with mobilization Flatus, no BM Tolerating regular diet  Objective: Vital signs in last 24 hours: Temp:  [97.5 F (36.4 C)-97.8 F (36.6 C)] 97.5 F (36.4 C) (09/16 0420) Pulse Rate:  [54-113] 54  (09/16 0420) Resp:  [14-16] 14  (09/16 0420) BP: (107-155)/(67-99) 107/67 mmHg (09/16 0420) SpO2:  [94 %-98 %] 98 % (09/16 0420) Last BM Date: 10/03/11  Intake/Output from previous day: 09/15 0701 - 09/16 0700 In: 1040 [P.O.:1040] Out: 1860 [Urine:1750; Drains:110] Intake/Output this shift: Total I/O In: 560 [P.O.:560] Out: 1310 [Urine:1250; Drains:60]  General appearance: alert, cooperative and no distress Resp: clear to auscultation bilaterally Cardio: regular rate and rhythm, S1, S2 normal, no murmur, click, rub or gallop GI: soft, active bowel sounds; non-distended Incision c/d/i; drain output serosanguinous  Lab Results:   Basename 10/06/11 0420  WBC 7.7  HGB 10.3*  HCT 32.3*  PLT 181   BMET  Basename 10/06/11 0420  NA 139  K 3.9  CL 105  CO2 29  GLUCOSE 78  BUN 27*  CREATININE 0.85  CALCIUM 8.2*   PT/INR No results found for this basename: LABPROT:2,INR:2 in the last 72 hours ABG No results found for this basename: PHART:2,PCO2:2,PO2:2,HCO3:2 in the last 72 hours  Studies/Results: No results found.  Anti-infectives: Anti-infectives     Start     Dose/Rate Route Frequency Ordered Stop   10/03/11 2200   acyclovir (ZOVIRAX) 200 MG capsule 400 mg        400 mg Oral 2 times daily 10/03/11 1705     10/03/11 0824   vancomycin (VANCOCIN) 1,500 mg in sodium chloride 0.9 % 500 mL IVPB        1,500 mg 250 mL/hr over 120 Minutes Intravenous 120 min pre-op 10/03/11 0824 10/03/11 1110          Assessment/Plan: s/p Procedure(s) (LRB) with comments: HERNIA REPAIR VENTRAL ADULT (N/A) - open ventral hernia repair with mesh INSERTION OF MESH (N/A) Plan for discharge  tomorrow Laxative today Mobilize as much as possible independently Will need to go home with drain in place.   LOS: 5 days    Kaj Vasil K. 10/08/2011

## 2011-10-10 NOTE — Discharge Summary (Signed)
Physician Discharge Summary  Patient ID: [name] MRN: [id number] DOB/AGE: 1975/08/21 36 y.o.  Admit date: 10/03/2011 Discharge date: 10/08/11   Admission Diagnoses: Ventral incisional hernia  Discharge Diagnoses: same  Principal Problem:  *Ventral hernia s/p lap. repair with mesh 10/03/11 Active Problems:  OBESITY, MORBID  BIPOLAR II DISORDER  HYPERTENSION  SLEEP APNEA  DM type 2 (diabetes mellitus, type 2)   Discharged Condition: good  Hospital Course: Open repair of ventral incisional hernia on 10/03/11.  Post-op, she had some respiratory difficulty and was observed in the step-down unit for a couple of days.  She had a lot of difficulty mobilizing. Physical therapy was consulted to help with mobilization. Her drain continued to put out large amounts of serous fluid. Her incision was healing well with no sign of infection.   Consults: None and cardiology  Significant Diagnostic Studies: none  Treatments: surgery: as above  Discharge Exam: Blood pressure 107/67, pulse 54, temperature 97.5 F (36.4 C), temperature source Oral, resp. rate 14, height 5\' 5"  (1.651 m), weight 346 lb 12.5 oz (157.3 kg), last menstrual period 07/22/2011, SpO2 98.00%. incision - staple line c/d/i; drain serosanguinous output  Disposition: 01-Home or Self Care  Discharge Orders    Future Appointments: Provider: Department: Dept Phone: Center:   10/12/2011 10:30 AM [facility] [phone] None   10/16/2011 1:40 PM Amanda Arms. Lynnley Doddridge, MD [facility] [phone] None       Medication List     As of 10/10/2011  4:36 PM    TAKE these medications         acetaminophen 500 MG tablet   Commonly known as: TYLENOL   Take 500 mg by mouth every 6 (six) hours as needed.      acetaminophen-codeine 300-30 MG per tablet   Commonly known as: TYLENOL #3   Take 1 tablet by mouth as needed. migraines      acyclovir 200 MG capsule   Commonly known as: ZOVIRAX   Take 400 mg by mouth 2 (two) times daily.      ALPRAZolam 0.5 MG tablet   Commonly known as: XANAX   Take 0.5 mg by mouth 3 (three) times daily. As needed      atenolol 50 MG tablet   Commonly known as: TENORMIN   Take 25 mg by mouth at bedtime.      carisoprodol 350 MG tablet   Commonly known as: SOMA   Take 350 mg by mouth as needed. migraines      cefUROXime 500 MG tablet   Commonly known as: CEFTIN   Take 500 mg by mouth 2 (two) times daily. For uti      cycloSPORINE 100 MG capsule   Commonly known as: SANDIMMUNE   Take 200 mg by mouth 2 (two) times daily.      diphenhydrAMINE 25 mg capsule   Commonly known as: BENADRYL   Take 50 mg by mouth at bedtime as needed. For sleep      divalproex 500 MG 24 hr tablet   Commonly known as: DEPAKOTE ER   Take 500 mg by mouth 2 (two) times daily.      glimepiride 4 MG tablet   Commonly known as: AMARYL   Take 4 mg by mouth daily before breakfast.      HYDROcodone-acetaminophen 5-325 MG per tablet   Commonly known as: NORCO/VICODIN   Take 1-2 tablets by mouth every 4 (four) hours as needed.  insulin NPH-insulin regular (70-30) 100 UNIT/ML injection   Commonly known as: NOVOLIN 70/30   Inject 35 Units into the skin 2 (two) times daily with a meal.      levothyroxine 112 MCG tablet   Commonly known as: SYNTHROID, LEVOTHROID   Take 112 mcg by mouth daily before breakfast.      metFORMIN 1000 MG tablet   Commonly known as: GLUCOPHAGE   Take 1,000 mg by mouth 2 (two) times daily with a meal.      multivitamin with minerals Tabs   Take 1 tablet by mouth daily.      Vitamin D3 5000 UNITS Caps   Take 1 capsule by mouth daily.           Follow-up Information    Follow up with Amanda Micek K., MD. In 3 days. (For drain removal)    Contact information:   [address] [place] [address] [phone]          Signed: Wynona Luna. 10/10/2011, 4:36 PM

## 2011-10-12 ENCOUNTER — Ambulatory Visit (INDEPENDENT_AMBULATORY_CARE_PROVIDER_SITE_OTHER): Payer: Medicaid Other | Admitting: General Surgery

## 2011-10-12 ENCOUNTER — Encounter (INDEPENDENT_AMBULATORY_CARE_PROVIDER_SITE_OTHER): Payer: Self-pay | Admitting: General Surgery

## 2011-10-12 VITALS — Ht 65.0 in | Wt 330.4 lb

## 2011-10-12 DIAGNOSIS — K439 Ventral hernia without obstruction or gangrene: Secondary | ICD-10-CM

## 2011-10-12 NOTE — Progress Notes (Signed)
Patient comes in today s/p open ventral hernia with mesh by Dr.Tsuei on 10/03/11.Marland KitchenMarland Kitchenpatient was very nauseated...the volume output was way over an acceptable amount and was red in color.Marland KitchenMarland Kitchen9/16 pt totaled 250cc's for the whole day..9/17 230cc's .Marland KitchenMarland Kitchen9/18 190cc.Marland KitchenMarland Kitchen9/19 210cc..9/20 150cc's..in which I determined the drain was not ready to be removed and double checked with Deanna as well as Dr.Cornett...pt asked for something for nausea and Dr.Cornett told me to call in zofran 4mg  po PRN nausea #20 no refills to the [facility] on [address]..the patient was ok with instructions and has an appt to see Dr.Tsuei on 9/24 at 1:40.Marland KitchenMarland KitchenI also informed her to call the office if she had any problems or concerns.Marland KitchenMarland Kitchen

## 2011-10-16 ENCOUNTER — Ambulatory Visit (INDEPENDENT_AMBULATORY_CARE_PROVIDER_SITE_OTHER): Payer: Medicaid Other | Admitting: Surgery

## 2011-10-16 ENCOUNTER — Encounter (INDEPENDENT_AMBULATORY_CARE_PROVIDER_SITE_OTHER): Payer: Self-pay | Admitting: Surgery

## 2011-10-16 VITALS — BP 133/88 | HR 90 | Temp 97.3°F | Resp 22 | Ht 65.0 in | Wt 329.4 lb

## 2011-10-16 DIAGNOSIS — K439 Ventral hernia without obstruction or gangrene: Secondary | ICD-10-CM

## 2011-10-16 NOTE — Progress Notes (Signed)
Status post open ventral hernia repair with mesh on 10/03/11. She has a trainer subcutaneous space. A straight continues to put out about 90 cc a day. However the drain site itself looks mildly erythematous. The midline incision is well healed. No sign of recurrent hernia or large neuroma. I made the decision to remove the staples as well as the drain today. She tolerated this well. She will address the drain site with Neosporin and a dry dressing until it is healed. Steri-Strips were applied to the midline wound she may shower over this area. Recheck in 2 weeks. She should continue limiting her activity. She is able to walk with no limits but should refrain from any heavy lifting.  Wilmon Arms. [name], MD, [facility]  10/16/2011 1:56 PM

## 2011-10-17 ENCOUNTER — Other Ambulatory Visit (INDEPENDENT_AMBULATORY_CARE_PROVIDER_SITE_OTHER): Payer: Self-pay | Admitting: Surgery

## 2011-10-17 ENCOUNTER — Other Ambulatory Visit (INDEPENDENT_AMBULATORY_CARE_PROVIDER_SITE_OTHER): Payer: Self-pay | Admitting: General Surgery

## 2011-10-18 ENCOUNTER — Encounter (INDEPENDENT_AMBULATORY_CARE_PROVIDER_SITE_OTHER): Payer: Self-pay | Admitting: Surgery

## 2011-10-18 ENCOUNTER — Ambulatory Visit (HOSPITAL_COMMUNITY)
Admission: RE | Admit: 2011-10-18 | Discharge: 2011-10-18 | Disposition: A | Discharge status: Home / Self Care | Payer: Medicaid Other | Source: Ambulatory Visit | Attending: Surgery | Admitting: Surgery

## 2011-10-18 ENCOUNTER — Ambulatory Visit (INDEPENDENT_AMBULATORY_CARE_PROVIDER_SITE_OTHER): Payer: Medicaid Other | Admitting: Surgery

## 2011-10-18 VITALS — BP 132/78 | HR 74 | Temp 96.9°F | Resp 16 | Ht 65.0 in | Wt 332.0 lb

## 2011-10-18 DIAGNOSIS — R109 Unspecified abdominal pain: Secondary | ICD-10-CM

## 2011-10-18 DIAGNOSIS — T148XXA Other injury of unspecified body region, initial encounter: Secondary | ICD-10-CM

## 2011-10-18 DIAGNOSIS — N200 Calculus of kidney: Secondary | ICD-10-CM | POA: Insufficient documentation

## 2011-10-18 DIAGNOSIS — K439 Ventral hernia without obstruction or gangrene: Secondary | ICD-10-CM

## 2011-10-18 DIAGNOSIS — K7689 Other specified diseases of liver: Secondary | ICD-10-CM | POA: Insufficient documentation

## 2011-10-18 DIAGNOSIS — Z9889 Other specified postprocedural states: Secondary | ICD-10-CM | POA: Insufficient documentation

## 2011-10-18 DIAGNOSIS — L089 Local infection of the skin and subcutaneous tissue, unspecified: Secondary | ICD-10-CM

## 2011-10-18 DIAGNOSIS — Z09 Encounter for follow-up examination after completed treatment for conditions other than malignant neoplasm: Secondary | ICD-10-CM

## 2011-10-18 DIAGNOSIS — R935 Abnormal findings on diagnostic imaging of other abdominal regions, including retroperitoneum: Secondary | ICD-10-CM | POA: Insufficient documentation

## 2011-10-18 LAB — CBC WITH DIFFERENTIAL/PLATELET
Lymphs Abs: 1.7 10*3/uL (ref 0.7–4.0)
MCH: 30.4 pg (ref 26.0–34.0)
MCHC: 33.5 g/dL (ref 30.0–36.0)
MCV: 91 fL (ref 78.0–100.0)
Neutrophils Relative %: 84 % — ABNORMAL HIGH (ref 43–77)
Platelets: 214 10*3/uL (ref 150–400)
RBC: 3.59 MIL/uL — ABNORMAL LOW (ref 3.87–5.11)
RDW: 15.4 % (ref 11.5–15.5)

## 2011-10-18 LAB — BASIC METABOLIC PANEL
CO2: 25 mEq/L (ref 19–32)
Calcium: 8.9 mg/dL (ref 8.4–10.5)
Chloride: 103 mEq/L (ref 96–112)
Glucose, Bld: 99 mg/dL (ref 70–99)
Potassium: 4.7 mEq/L (ref 3.5–5.3)
Sodium: 141 mEq/L (ref 135–145)

## 2011-10-18 MED ORDER — DOXYCYCLINE HYCLATE 100 MG PO TABS: 100.0000 mg | ORAL_TABLET | Freq: Two times a day (BID) | ORAL | Status: DC

## 2011-10-18 MED ORDER — IOHEXOL 300 MG/ML  SOLN
100.0000 mL | Freq: Once | INTRAMUSCULAR | Status: AC | PRN
  Administered 2011-10-18: 100 mL via INTRAVENOUS

## 2011-10-18 NOTE — Progress Notes (Signed)
Chief complaint: Drainage from incision increasing abdominal pain and fever  History of present illness: This patient underwent repair of a ventral hernia with mesh on 10/03/2011. She was in the office for a postop visit 2 days ago and had staples removed and the drain removed. She's had increasing discomfort from the incision since then. She's also noted purulent drainage from the drain site and she has had fevers to 102 at home. She feels weak. She had some mild anorexia and nausea. She's had no diarrhea.  Exam: Vital signs:BP 132/78  Pulse 74  Temp 96.9 F (36.1 C) (Temporal)  Resp 16  Ht 5\' 5"  (1.651 m)  Wt 332 lb (150.594 kg)  BMI 55.25 kg/m2 General: Patient is alert but appears uncomfortable. She does not appear septic or toxic.  Abdomen: Soft laterally but tender throughout. The incision in the midline. There is no obvious fluctuance to the incision it does not appear particularly red. It is tender however. There is some brownish drainage at the drain site the left side of the incision. I can probe is about 4 cm and there is some induration along the tract of the drain but I don't detect a big cavity at all.  Impression: I am concerned that she is in the process of developing a wound infection. However the drainage around the old drain site is very superficial appearing.  Plan: We are going to pain a stat CBC and being that and obtain a CT of the abdomen to see if there is any wound infection or abscess developing.ongoing to start her empirically on doxycycline pending results of the CT scan. I did take a culture swab from the drainage from the JP site. Of note is that she has a history of MRSA.

## 2011-10-18 NOTE — Telephone Encounter (Signed)
Can this pt have this Rx. It was sent to Dr Luisa Hart but she is your pt

## 2011-10-18 NOTE — Telephone Encounter (Signed)
Here is another Rx that came to Dr Luisa Hart, can she have refill on this Rx

## 2011-10-18 NOTE — Patient Instructions (Signed)
We will check some lab tests today and get a CT scan to see if you have an infection in the incision developing

## 2011-10-19 ENCOUNTER — Telehealth (INDEPENDENT_AMBULATORY_CARE_PROVIDER_SITE_OTHER): Payer: Self-pay | Admitting: Surgery

## 2011-10-19 ENCOUNTER — Inpatient Hospital Stay (HOSPITAL_COMMUNITY)
Admission: AD | Admit: 2011-10-19 | Discharge: 2011-10-29 | DRG: 857 | Disposition: A | Discharge status: Home / Self Care | Payer: Medicaid Other | Source: Ambulatory Visit | Attending: Surgery | Admitting: Surgery

## 2011-10-19 ENCOUNTER — Inpatient Hospital Stay (HOSPITAL_COMMUNITY): Payer: Medicaid Other

## 2011-10-19 ENCOUNTER — Other Ambulatory Visit (INDEPENDENT_AMBULATORY_CARE_PROVIDER_SITE_OTHER): Payer: Self-pay | Admitting: Surgery

## 2011-10-19 ENCOUNTER — Encounter (INDEPENDENT_AMBULATORY_CARE_PROVIDER_SITE_OTHER): Payer: Medicaid Other | Admitting: Surgery

## 2011-10-19 ENCOUNTER — Encounter (HOSPITAL_COMMUNITY): Payer: Self-pay | Admitting: *Deleted

## 2011-10-19 DIAGNOSIS — E039 Hypothyroidism, unspecified: Secondary | ICD-10-CM | POA: Diagnosis present

## 2011-10-19 DIAGNOSIS — Z91018 Allergy to other foods: Secondary | ICD-10-CM

## 2011-10-19 DIAGNOSIS — Z6841 Body Mass Index (BMI) 40.0 and over, adult: Secondary | ICD-10-CM

## 2011-10-19 DIAGNOSIS — Z794 Long term (current) use of insulin: Secondary | ICD-10-CM

## 2011-10-19 DIAGNOSIS — I129 Hypertensive chronic kidney disease with stage 1 through stage 4 chronic kidney disease, or unspecified chronic kidney disease: Secondary | ICD-10-CM | POA: Diagnosis present

## 2011-10-19 DIAGNOSIS — Y9210 Unspecified residential institution as the place of occurrence of the external cause: Secondary | ICD-10-CM | POA: Diagnosis not present

## 2011-10-19 DIAGNOSIS — T360X5A Adverse effect of penicillins, initial encounter: Secondary | ICD-10-CM | POA: Diagnosis not present

## 2011-10-19 DIAGNOSIS — E119 Type 2 diabetes mellitus without complications: Secondary | ICD-10-CM | POA: Diagnosis present

## 2011-10-19 DIAGNOSIS — F319 Bipolar disorder, unspecified: Secondary | ICD-10-CM | POA: Diagnosis present

## 2011-10-19 DIAGNOSIS — Z888 Allergy status to other drugs, medicaments and biological substances status: Secondary | ICD-10-CM

## 2011-10-19 DIAGNOSIS — Z8673 Personal history of transient ischemic attack (TIA), and cerebral infarction without residual deficits: Secondary | ICD-10-CM

## 2011-10-19 DIAGNOSIS — Y838 Other surgical procedures as the cause of abnormal reaction of the patient, or of later complication, without mention of misadventure at the time of the procedure: Secondary | ICD-10-CM | POA: Diagnosis present

## 2011-10-19 DIAGNOSIS — Z7982 Long term (current) use of aspirin: Secondary | ICD-10-CM

## 2011-10-19 DIAGNOSIS — N289 Disorder of kidney and ureter, unspecified: Secondary | ICD-10-CM | POA: Diagnosis not present

## 2011-10-19 DIAGNOSIS — L270 Generalized skin eruption due to drugs and medicaments taken internally: Secondary | ICD-10-CM | POA: Diagnosis not present

## 2011-10-19 DIAGNOSIS — N189 Chronic kidney disease, unspecified: Secondary | ICD-10-CM | POA: Diagnosis present

## 2011-10-19 DIAGNOSIS — Z91040 Latex allergy status: Secondary | ICD-10-CM

## 2011-10-19 DIAGNOSIS — K660 Peritoneal adhesions (postprocedural) (postinfection): Secondary | ICD-10-CM | POA: Diagnosis present

## 2011-10-19 DIAGNOSIS — L02219 Cutaneous abscess of trunk, unspecified: Secondary | ICD-10-CM | POA: Diagnosis present

## 2011-10-19 DIAGNOSIS — Y92009 Unspecified place in unspecified non-institutional (private) residence as the place of occurrence of the external cause: Secondary | ICD-10-CM

## 2011-10-19 DIAGNOSIS — K439 Ventral hernia without obstruction or gangrene: Secondary | ICD-10-CM

## 2011-10-19 DIAGNOSIS — D72829 Elevated white blood cell count, unspecified: Secondary | ICD-10-CM | POA: Diagnosis present

## 2011-10-19 DIAGNOSIS — A4901 Methicillin susceptible Staphylococcus aureus infection, unspecified site: Secondary | ICD-10-CM | POA: Diagnosis present

## 2011-10-19 DIAGNOSIS — H02849 Edema of unspecified eye, unspecified eyelid: Secondary | ICD-10-CM | POA: Diagnosis not present

## 2011-10-19 DIAGNOSIS — L408 Other psoriasis: Secondary | ICD-10-CM | POA: Diagnosis present

## 2011-10-19 DIAGNOSIS — R609 Edema, unspecified: Secondary | ICD-10-CM | POA: Diagnosis not present

## 2011-10-19 DIAGNOSIS — IMO0002 Reserved for concepts with insufficient information to code with codable children: Principal | ICD-10-CM | POA: Diagnosis present

## 2011-10-19 DIAGNOSIS — R197 Diarrhea, unspecified: Secondary | ICD-10-CM | POA: Diagnosis not present

## 2011-10-19 DIAGNOSIS — R110 Nausea: Secondary | ICD-10-CM | POA: Diagnosis not present

## 2011-10-19 DIAGNOSIS — M793 Panniculitis, unspecified: Secondary | ICD-10-CM | POA: Diagnosis present

## 2011-10-19 DIAGNOSIS — E7800 Pure hypercholesterolemia, unspecified: Secondary | ICD-10-CM | POA: Diagnosis present

## 2011-10-19 DIAGNOSIS — K219 Gastro-esophageal reflux disease without esophagitis: Secondary | ICD-10-CM | POA: Diagnosis present

## 2011-10-19 DIAGNOSIS — Z8719 Personal history of other diseases of the digestive system: Secondary | ICD-10-CM

## 2011-10-19 DIAGNOSIS — M810 Age-related osteoporosis without current pathological fracture: Secondary | ICD-10-CM | POA: Diagnosis present

## 2011-10-19 DIAGNOSIS — Z881 Allergy status to other antibiotic agents status: Secondary | ICD-10-CM

## 2011-10-19 DIAGNOSIS — G4730 Sleep apnea, unspecified: Secondary | ICD-10-CM | POA: Diagnosis present

## 2011-10-19 LAB — COMPREHENSIVE METABOLIC PANEL
ALT: 10 U/L (ref 0–35)
AST: 13 U/L (ref 0–37)
Albumin: 2.3 g/dL — ABNORMAL LOW (ref 3.5–5.2)
Alkaline Phosphatase: 63 U/L (ref 39–117)
Calcium: 8.5 mg/dL (ref 8.4–10.5)
GFR calc Af Amer: 70 mL/min — ABNORMAL LOW (ref >90)
Glucose, Bld: 125 mg/dL — ABNORMAL HIGH (ref 70–99)
Potassium: 4.1 mEq/L (ref 3.5–5.1)
Sodium: 138 mEq/L (ref 135–145)
Total Protein: 6.2 g/dL (ref 6.0–8.3)

## 2011-10-19 LAB — CBC WITH DIFFERENTIAL/PLATELET
Basophils Absolute: 0 10*3/uL (ref 0.0–0.1)
Eosinophils Absolute: 0.1 10*3/uL (ref 0.0–0.7)
Eosinophils Relative: 1 % (ref 0–5)
Lymphs Abs: 0.9 10*3/uL (ref 0.7–4.0)
MCH: 29.7 pg (ref 26.0–34.0)
MCV: 91.8 fL (ref 78.0–100.0)
Neutrophils Relative %: 74 % (ref 43–77)
Platelets: 197 10*3/uL (ref 150–400)
RBC: 3.4 MIL/uL — ABNORMAL LOW (ref 3.87–5.11)
RDW: 15.3 % (ref 11.5–15.5)
WBC: 19.1 10*3/uL — ABNORMAL HIGH (ref 4.0–10.5)

## 2011-10-19 LAB — URINE MICROSCOPIC-ADD ON

## 2011-10-19 LAB — PROTIME-INR
INR: 1.39 (ref 0.00–1.49)
Prothrombin Time: 16.7 seconds — ABNORMAL HIGH (ref 11.6–15.2)

## 2011-10-19 LAB — GLUCOSE, CAPILLARY
Glucose-Capillary: 91 mg/dL (ref 70–99)
Glucose-Capillary: 142 mg/dL — ABNORMAL HIGH (ref 70–99)

## 2011-10-19 LAB — URINALYSIS, ROUTINE W REFLEX MICROSCOPIC
Protein, ur: 100 mg/dL — AB
Urobilinogen, UA: 1 mg/dL (ref 0.0–1.0)

## 2011-10-19 LAB — APTT: aPTT: 42 seconds — ABNORMAL HIGH (ref 24–37)

## 2011-10-19 MED ORDER — ACYCLOVIR 200 MG PO CAPS
400.0000 mg | ORAL_CAPSULE | Freq: Two times a day (BID) | ORAL | Status: DC
  Administered 2011-10-20 – 2011-10-29 (×20): 400 mg via ORAL
  Filled 2011-10-19 (×23): qty 2

## 2011-10-19 MED ORDER — FENTANYL CITRATE 0.05 MG/ML IJ SOLN
INTRAMUSCULAR | Status: AC | PRN
  Administered 2011-10-19: 25 ug via INTRAVENOUS

## 2011-10-19 MED ORDER — ENOXAPARIN SODIUM 40 MG/0.4ML ~~LOC~~ SOLN
40.0000 mg | SUBCUTANEOUS | Status: DC
  Filled 2011-10-19: qty 0.4

## 2011-10-19 MED ORDER — FENTANYL CITRATE 0.05 MG/ML IJ SOLN
INTRAMUSCULAR | Status: AC
  Filled 2011-10-19: qty 2

## 2011-10-19 MED ORDER — PANTOPRAZOLE SODIUM 40 MG IV SOLR
40.0000 mg | Freq: Every day | INTRAVENOUS | Status: DC
  Administered 2011-10-20 – 2011-10-22 (×4): 40 mg via INTRAVENOUS
  Filled 2011-10-19 (×5): qty 40

## 2011-10-19 MED ORDER — SODIUM CHLORIDE 0.9 % IV SOLN
1.0000 g | INTRAVENOUS | Status: DC
  Administered 2011-10-19 – 2011-10-20 (×2): 1 g via INTRAVENOUS
  Filled 2011-10-19 (×3): qty 1

## 2011-10-19 MED ORDER — HYDROMORPHONE HCL PF 1 MG/ML IJ SOLN
2.0000 mg | INTRAMUSCULAR | Status: DC | PRN
  Administered 2011-10-19 – 2011-10-20 (×3): 2 mg via INTRAVENOUS
  Administered 2011-10-20: 1 mg via INTRAVENOUS
  Administered 2011-10-20 – 2011-10-23 (×10): 2 mg via INTRAVENOUS
  Filled 2011-10-19: qty 2
  Filled 2011-10-19: qty 1
  Filled 2011-10-19: qty 2
  Filled 2011-10-19: qty 1
  Filled 2011-10-19 (×9): qty 2
  Filled 2011-10-19: qty 1
  Filled 2011-10-19: qty 2

## 2011-10-19 MED ORDER — POTASSIUM CHLORIDE 2 MEQ/ML IV SOLN
INTRAVENOUS | Status: DC
  Administered 2011-10-19 – 2011-10-23 (×8): via INTRAVENOUS
  Filled 2011-10-19 (×16): qty 1000

## 2011-10-19 MED ORDER — ALPRAZOLAM 0.5 MG PO TABS
0.5000 mg | ORAL_TABLET | Freq: Three times a day (TID) | ORAL | Status: DC | PRN
  Administered 2011-10-19 – 2011-10-28 (×13): 0.5 mg via ORAL
  Filled 2011-10-19 (×14): qty 1

## 2011-10-19 MED ORDER — OXYCODONE HCL 5 MG PO TABS
5.0000 mg | ORAL_TABLET | ORAL | Status: DC | PRN
  Administered 2011-10-19 – 2011-10-25 (×15): 5 mg via ORAL
  Filled 2011-10-19 (×15): qty 1

## 2011-10-19 MED ORDER — MIDAZOLAM HCL 2 MG/2ML IJ SOLN
INTRAMUSCULAR | Status: AC
  Filled 2011-10-19: qty 6

## 2011-10-19 MED ORDER — ENOXAPARIN SODIUM 40 MG/0.4ML ~~LOC~~ SOLN
40.0000 mg | SUBCUTANEOUS | Status: DC
  Administered 2011-10-20 – 2011-10-24 (×4): 40 mg via SUBCUTANEOUS
  Filled 2011-10-19 (×6): qty 0.4

## 2011-10-19 MED ORDER — ONDANSETRON HCL 4 MG/2ML IJ SOLN
4.0000 mg | Freq: Four times a day (QID) | INTRAMUSCULAR | Status: DC | PRN
  Administered 2011-10-19 – 2011-10-26 (×4): 4 mg via INTRAVENOUS
  Filled 2011-10-19 (×4): qty 2

## 2011-10-19 MED ORDER — INSULIN ASPART 100 UNIT/ML ~~LOC~~ SOLN
0.0000 [IU] | Freq: Three times a day (TID) | SUBCUTANEOUS | Status: DC
  Administered 2011-10-20 (×3): 3 [IU] via SUBCUTANEOUS
  Administered 2011-10-23: 4 [IU] via SUBCUTANEOUS
  Administered 2011-10-23 – 2011-10-24 (×2): 3 [IU] via SUBCUTANEOUS
  Administered 2011-10-24: 4 [IU] via SUBCUTANEOUS
  Administered 2011-10-24: 3 [IU] via SUBCUTANEOUS
  Administered 2011-10-25 (×2): 4 [IU] via SUBCUTANEOUS
  Administered 2011-10-26 – 2011-10-27 (×2): 3 [IU] via SUBCUTANEOUS

## 2011-10-19 MED ORDER — ATENOLOL 50 MG PO TABS
50.0000 mg | ORAL_TABLET | Freq: Every day | ORAL | Status: DC
  Administered 2011-10-20 – 2011-10-28 (×7): 50 mg via ORAL
  Filled 2011-10-19 (×11): qty 1

## 2011-10-19 MED ORDER — INSULIN ASPART 100 UNIT/ML ~~LOC~~ SOLN: 0.0000 [IU] | Freq: Every day | SUBCUTANEOUS | Status: DC

## 2011-10-19 MED ORDER — DIVALPROEX SODIUM ER 500 MG PO TB24
500.0000 mg | ORAL_TABLET | Freq: Two times a day (BID) | ORAL | Status: DC
  Administered 2011-10-20 – 2011-10-29 (×20): 500 mg via ORAL
  Filled 2011-10-19 (×22): qty 1

## 2011-10-19 MED ORDER — CYCLOSPORINE 100 MG PO CAPS
200.0000 mg | ORAL_CAPSULE | Freq: Two times a day (BID) | ORAL | Status: DC
  Administered 2011-10-20 – 2011-10-27 (×16): 200 mg via ORAL
  Filled 2011-10-19 (×22): qty 2

## 2011-10-19 MED ORDER — MIDAZOLAM HCL 5 MG/5ML IJ SOLN
INTRAMUSCULAR | Status: AC | PRN
  Administered 2011-10-19 (×2): 1 mg via INTRAVENOUS

## 2011-10-19 MED ORDER — LEVOTHYROXINE SODIUM 112 MCG PO TABS
112.0000 ug | ORAL_TABLET | Freq: Every day | ORAL | Status: DC
  Administered 2011-10-20 – 2011-10-29 (×9): 112 ug via ORAL
  Filled 2011-10-19 (×11): qty 1

## 2011-10-19 NOTE — H&P (Signed)
[name] is an 36 y.o. female.   Chief Complaint: ventral hernia repair 10/03/11 Pt did well and went home F/u visit in office she has tenderness; purulent oozing from site CT shows abd fluid collection scheduled now for Abdominal abscess drain placement HPI: DM; crevical ca; HTN; GERD; psoriasis; fatty liver; sleep apnea; Bipolar  Past Medical History  Diagnosis Date  . Diabetes mellitus   . Migraine   . Hypoactive thyroid   . Cancer     cervical  . Osteoporosis   . Hypertension   . High cholesterol   . Anginal pain 2011    "stress per Dr Pang"-occ at present from increased stress  . Dysrhythmia     hx MVP  . GERD (gastroesophageal reflux disease)   . Chronic kidney disease     stones in past,  hematuria daily- states hasnt been evaluated due to lack of insurance  . Psoriasis     scattered throughout body- large amount- states increased with stress of surgery  . Fatty liver     with gall bladder sludge by pt history/ no stones  . Stroke     ? subacranoid bleed-  no defits  . Mental disorder     bipolar  . Anxiety   . Sleep apnea      STOP BANG SCORE 6  . Ventral hernia 09/13/2011  . Depression   . Shortness of breath     with exertion/   ? adult onset asthma  . Pneumonia     hx of   . Arthritis   . Anemia     hx of anemia during pregnancy   . PONV (postoperative nausea and vomiting)     also hard to get asleep per pt   . Bipolar disorder     Past Surgical History  Procedure Date  . Cesarean section   . Appendectomy   . Lipoma     total 3  . Neuroplasty / transposition median nerve at carpal tunnel bilateral   . Cervical cancer   . Kidney stone surgery   . Ventral hernia repair 10/03/2011    Procedure: HERNIA REPAIR VENTRAL ADULT;  Surgeon: Wilmon Arms. [name], MD;  Location: [facility];  Service: General;  Laterality: N/A;  open ventral hernia repair with mesh    Family History  Problem Relation Age of Onset  . Hypertension Mother   . Glaucoma  Mother   . Hypertension Father   . Cancer Father     precancerious polyps  . Cancer Maternal Uncle     colon   Social History:  reports that she has never smoked. She has never used smokeless tobacco. She reports that she does not drink alcohol or use illicit drugs.  Allergies:  Allergies  Allergen Reactions  . Cephalexin Anaphylaxis    But can tolerate Ceftin and Amoxicillin per patient  . Latex Itching  . Lisinopril Cough  . Statins Other (See Comments)    Back pain   . Tea Rash    Medications Prior to Admission  Medication Sig Dispense Refill  . acetaminophen (TYLENOL) 500 MG tablet Take 500 mg by mouth every 6 (six) hours as needed.      Marland Kitchen acyclovir (ZOVIRAX) 200 MG capsule Take 400 mg by mouth 2 (two) times daily.       Marland Kitchen ALPRAZolam (XANAX) 0.5 MG tablet Take 0.5 mg by mouth 3 (three) times daily as needed. For anxiety      . atenolol (TENORMIN) 50  MG tablet Take 50 mg by mouth at bedtime.       . Cholecalciferol (VITAMIN D3) 5000 UNITS CAPS Take 1 capsule by mouth daily.      . cycloSPORINE (SANDIMMUNE) 100 MG capsule Take 200 mg by mouth 2 (two) times daily.       . diphenhydrAMINE (BENADRYL) 25 mg capsule Take 50 mg by mouth at bedtime as needed. For sleep      . divalproex (DEPAKOTE ER) 500 MG 24 hr tablet Take 500 mg by mouth 2 (two) times daily.      Marland Kitchen doxycycline (VIBRA-TABS) 100 MG tablet Take 100 mg by mouth 2 (two) times daily.      Marland Kitchen glimepiride (AMARYL) 4 MG tablet Take 4 mg by mouth daily before breakfast.      . HYDROcodone-acetaminophen (NORCO/VICODIN) 5-325 MG per tablet Take 1-2 tablets by mouth every 4 (four) hours as needed. For pain      . ibuprofen (ADVIL,MOTRIN) 200 MG tablet Take 800 mg by mouth every 6 (six) hours as needed. For pain      . insulin NPH-insulin regular (NOVOLIN 70/30) (70-30) 100 UNIT/ML injection Inject 35 Units into the skin 2 (two) times daily with a meal.       . levothyroxine (SYNTHROID, LEVOTHROID) 112 MCG tablet Take 112 mcg by  mouth daily before breakfast.       . metFORMIN (GLUCOPHAGE) 1000 MG tablet Take 1,000 mg by mouth 2 (two) times daily with a meal.      . Multiple Vitamin (MULTIVITAMIN WITH MINERALS) TABS Take 1 tablet by mouth daily.      . ondansetron (ZOFRAN) 4 MG tablet Take 4 mg by mouth every 8 (eight) hours as needed. For nausea      . DISCONTD: doxycycline (VIBRA-TABS) 100 MG tablet Take 1 tablet (100 mg total) by mouth 2 (two) times daily.  14 tablet  2    Results for orders placed during the hospital encounter of 10/19/11 (from the past 48 hour(s))  URINALYSIS, ROUTINE W REFLEX MICROSCOPIC     Status: Abnormal   Collection Time   10/19/11 11:15 AM      Component Value Range Comment   Color, Urine RED (*) YELLOW BIOCHEMICALS MAY BE AFFECTED BY COLOR   APPearance TURBID (*) CLEAR    Specific Gravity, Urine >1.046 (*) 1.005 - 1.030    pH 5.5  5.0 - 8.0    Glucose, UA NEGATIVE  NEGATIVE mg/dL    Hgb urine dipstick NEGATIVE  NEGATIVE    Bilirubin Urine MODERATE (*) NEGATIVE    Ketones, ur 15 (*) NEGATIVE mg/dL    Protein, ur 914 (*) NEGATIVE mg/dL    Urobilinogen, UA 1.0  0.0 - 1.0 mg/dL    Nitrite POSITIVE (*) NEGATIVE    Leukocytes, UA SMALL (*) NEGATIVE   URINE MICROSCOPIC-ADD ON     Status: Abnormal   Collection Time   10/19/11 11:15 AM      Component Value Range Comment   Squamous Epithelial / LPF FEW (*) RARE    WBC, UA 7-10  <3 WBC/hpf    RBC / HPF 0-2  <3 RBC/hpf    Bacteria, UA FEW (*) RARE    Urine-Other AMORPHOUS URATES/PHOSPHATES   URINALYSIS PERFORMED ON SUPERNATANT  CBC WITH DIFFERENTIAL     Status: Abnormal   Collection Time   10/19/11 11:31 AM      Component Value Range Comment   WBC 19.1 (*) 4.0 - 10.5 K/uL  RBC 3.40 (*) 3.87 - 5.11 MIL/uL    Hemoglobin 10.1 (*) 12.0 - 15.0 g/dL    HCT 16.1 (*) 09.6 - 46.0 %    MCV 91.8  78.0 - 100.0 fL    MCH 29.7  26.0 - 34.0 pg    MCHC 32.4  30.0 - 36.0 g/dL    RDW 04.5  40.9 - 81.1 %    Platelets 197  150 - 400 K/uL     Neutrophils Relative 74  43 - 77 %    Neutro Abs 14.2 (*) 1.7 - 7.7 K/uL    Lymphocytes Relative 5 (*) 12 - 46 %    Lymphs Abs 0.9  0.7 - 4.0 K/uL    Monocytes Relative 20 (*) 3 - 12 %    Monocytes Absolute 3.9 (*) 0.1 - 1.0 K/uL    Eosinophils Relative 1  0 - 5 %    Eosinophils Absolute 0.1  0.0 - 0.7 K/uL    Basophils Relative 0  0 - 1 %    Basophils Absolute 0.0  0.0 - 0.1 K/uL    Ct Abdomen Pelvis W Contrast  10/18/2011  *RADIOLOGY REPORT*  Clinical Data: Abdominal pain, wound drainage, status post hernia repair  CT ABDOMEN AND PELVIS WITH CONTRAST  Technique:  Multidetector CT imaging of the abdomen and pelvis was performed following the standard protocol during bolus administration of intravenous contrast.  Contrast: OMNIPAQUE IOHEXOL 300 MG/ML  SOLN  Comparison: 05/01/2011  Findings: Sagittal images of the spine are unremarkable.  Mild degenerative changes lower lumbar spine.  The  Lung bases are unremarkable.  Significant fatty infiltration of the liver again noted.  Spleen, pancreas and adrenal glands are unremarkable.  Kidneys are symmetrical in size and enhancement.  No hydronephrosis or hydroureter.  Oral contrast material was given to the patient.  No small bowel obstruction.  No free abdominal air.  No abdominal ascites is noted.  There is nonobstructive calcified calculus in the lower pole of the left kidney measures 5 mm. The patient is status post recent hernia repair.  There is a walled collection midline deep anterior abdominal wall between the rectus muscles and below the fascia. This collection is best visualized in the sagittal image 103 measures 14.7 cm cranial caudally and 4 cm in AP dimensional. In axial image 55 the collection is extending in the subcutaneous fat midline anterior abdominal wall.  Best visualized in the axial image 100 measures 8.8 x 4 cm.  Some air is noted within the subcutaneous collection. These are highly suspicious for abscesses.  Clinical correlation  is necessary.  There is also some extension in the left anterior abdominal wall subcutaneous fat just adjacent to midline axial image 52 measures 2.8 x 1.7 cm just above the fascia.  There is a tubular tract containing air extending to left cutaneous region. This is best visualized in axial image 41.  This is also suspicious for an abscess with a fistula drainage tract at the level of the skin. There is additional stranding of the subcutaneous fat bilateral anterior abdominal wall.  There is a skin thickening and subcutaneous stranding anterior lower abdomen and pelvic wall.  No pelvic ascites or adenopathy.  The uterus and adnexa are unremarkable.  The urinary bladder is unremarkable.  No distal colonic obstruction.  IMPRESSION:  1.There is a walled collection midline deep anterior abdominal wall between the rectus muscles and below the fascia. This collection is best visualized in the sagittal image 103 measures  14.7 cm cranial caudally and 4 cm in AP dimensional. In axial image 55 the collection is extending in the subcutaneous fat midline anterior abdominal wall.  Best visualized in the axial image 100 measures 8.8 x 4 cm.  Some air is noted within the subcutaneous collection.  These are highly suspicious for abscesses.  Clinical correlation is necessary. 2.  Fatty infiltration of the liver again noted. 3.  There is skin thickening and stranding of subcutaneous fat anterior pelvic wall and lower abdominal wall. 4.  There is a second subcutaneous collection in the left anterior abdominal wall just adjacent to midline axial image 52 measures 2.8 x 1.7 cm.  There is a tubular tract containing air extending to left cutaneous region. This is best visualized in axial image 41. This is also suspicious for an abscess with a fistula drainage tract at the level of the skin.   Original Report Authenticated By: Natasha Mead, M.D.     Review of Systems  Constitutional: Negative for fever.  Respiratory: Negative for  shortness of breath.   Gastrointestinal: Positive for nausea and abdominal pain.  Neurological: Positive for weakness. Negative for headaches.    Blood pressure 115/49, pulse 77, temperature 97.9 F (36.6 C), temperature source Oral, resp. rate 17, height 5\' 5"  (1.651 m), weight 332 lb (150.594 kg), SpO2 96.00%. Physical Exam  Constitutional: She is oriented to person, place, and time.  Cardiovascular: Normal rate, regular rhythm and normal heart sounds.   No murmur heard. Respiratory: Effort normal and breath sounds normal. She has no wheezes.  GI: Soft. Bowel sounds are normal. There is tenderness.       Obese   Musculoskeletal: Normal range of motion.  Neurological: She is alert and oriented to person, place, and time.  Psychiatric: She has a normal mood and affect. Her behavior is normal. Judgment and thought content normal.     Assessment/Plan Ventral hernia repair 10/03/11 New onset pain; purulent oozing from site CT shows abscess Scheduled now for drain placement in IR Pt aware of procedure benefits and risks and agreeable to roceed Consent signed and in chart  Rayshad Riviello A 10/19/2011, 12:21 PM

## 2011-10-19 NOTE — Procedures (Signed)
Successful CT guided abd wall abscess drain insertion No comp Stable 110cc exudative fluid aspirated  Gs/cx sent Full report in pacs

## 2011-10-19 NOTE — H&P (Signed)
[name] [name] June 30, 1975  [id number].   Primary Surgeon MD: Dr. Manus Rudd Chief Complaint/Reason for Consult: abdominal abscess, s/p [facility] HPI: This is a 36 yo female who underwent an open VHR by Dr. [name] on 10-03-11.  She did well and was dc home without any issues.  She just saw him in the office this past Tuesday and had her drain removed.  This was serous at the time.  She reports that the day before she had a low grade fever.  She spiked a fever again yesterday of 102.  She went to see her PCP, who referred her to our office.  She was draining purulent output from her drain site.  A CT scan was ordered which revealed a fluid collection between her rectus muscle and her fascia.  She was found to have a WBC of 22K as well.  She admits to a poor appetite with some nausea as well.  She was direct admitted to the hospital for further care.  Review of systems: Please see HPI, otherwise negative.  Family History  Problem Relation Age of Onset  . Hypertension Mother   . Glaucoma Mother   . Hypertension Father   . Cancer Father     precancerious polyps  . Cancer Maternal Uncle     colon    Past Medical History  Diagnosis Date  . Diabetes mellitus   . Migraine   . Hypoactive thyroid   . Cancer     cervical  . Osteoporosis   . Hypertension   . High cholesterol   . Anginal pain 2011    "stress per Dr Pang"-occ at present from increased stress  . Dysrhythmia     hx MVP  . GERD (gastroesophageal reflux disease)   . Chronic kidney disease     stones in past,  hematuria daily- states hasnt been evaluated due to lack of insurance  . Psoriasis     scattered throughout body- large amount- states increased with stress of surgery  . Fatty liver     with gall bladder sludge by pt history/ no stones  . Stroke     ? subacranoid bleed-  no defits  . Mental disorder     bipolar  . Anxiety   . Sleep apnea      STOP BANG SCORE 6  . Ventral hernia 09/13/2011  . Depression   .  Shortness of breath     with exertion/   ? adult onset asthma  . Pneumonia     hx of   . Arthritis   . Anemia     hx of anemia during pregnancy   . PONV (postoperative nausea and vomiting)     also hard to get asleep per pt   . Bipolar disorder     Past Surgical History  Procedure Date  . Cesarean section   . Appendectomy   . Lipoma     total 3  . Neuroplasty / transposition median nerve at carpal tunnel bilateral   . Cervical cancer   . Kidney stone surgery   . Ventral hernia repair 10/03/2011    Procedure: HERNIA REPAIR VENTRAL ADULT;  Surgeon: Wilmon Arms. [name], MD;  Location: [facility];  Service: General;  Laterality: N/A;  open ventral hernia repair with mesh    Social History:  reports that she has never smoked. She has never used smokeless tobacco. She reports that she does not drink alcohol or use illicit drugs.  Allergies:  Allergies  Allergen  Reactions  . Cephalexin Anaphylaxis    But can tolerate Ceftin and Amoxicillin per patient  . Latex Itching  . Lisinopril Cough  . Statins Other (See Comments)    Back pain   . Tea Rash    Medications Prior to Admission  Medication Sig Dispense Refill  . acetaminophen (TYLENOL) 500 MG tablet Take 500 mg by mouth every 6 (six) hours as needed.      Marland Kitchen acyclovir (ZOVIRAX) 200 MG capsule Take 400 mg by mouth 2 (two) times daily.       Marland Kitchen ALPRAZolam (XANAX) 0.5 MG tablet Take 0.5 mg by mouth 3 (three) times daily as needed. For anxiety      . atenolol (TENORMIN) 50 MG tablet Take 50 mg by mouth at bedtime.       . Cholecalciferol (VITAMIN D3) 5000 UNITS CAPS Take 1 capsule by mouth daily.      . cycloSPORINE (SANDIMMUNE) 100 MG capsule Take 200 mg by mouth 2 (two) times daily.       . diphenhydrAMINE (BENADRYL) 25 mg capsule Take 50 mg by mouth at bedtime as needed. For sleep      . divalproex (DEPAKOTE ER) 500 MG 24 hr tablet Take 500 mg by mouth 2 (two) times daily.      Marland Kitchen doxycycline (VIBRA-TABS) 100 MG tablet Take 100 mg by  mouth 2 (two) times daily.      Marland Kitchen glimepiride (AMARYL) 4 MG tablet Take 4 mg by mouth daily before breakfast.      . HYDROcodone-acetaminophen (NORCO/VICODIN) 5-325 MG per tablet Take 1-2 tablets by mouth every 4 (four) hours as needed. For pain      . ibuprofen (ADVIL,MOTRIN) 200 MG tablet Take 800 mg by mouth every 6 (six) hours as needed. For pain      . insulin NPH-insulin regular (NOVOLIN 70/30) (70-30) 100 UNIT/ML injection Inject 35 Units into the skin 2 (two) times daily with a meal.       . levothyroxine (SYNTHROID, LEVOTHROID) 112 MCG tablet Take 112 mcg by mouth daily before breakfast.       . metFORMIN (GLUCOPHAGE) 1000 MG tablet Take 1,000 mg by mouth 2 (two) times daily with a meal.      . Multiple Vitamin (MULTIVITAMIN WITH MINERALS) TABS Take 1 tablet by mouth daily.      . ondansetron (ZOFRAN) 4 MG tablet Take 4 mg by mouth every 8 (eight) hours as needed. For nausea      . DISCONTD: doxycycline (VIBRA-TABS) 100 MG tablet Take 1 tablet (100 mg total) by mouth 2 (two) times daily.  14 tablet  2    Blood pressure 115/49, pulse 77, temperature 97.9 F (36.6 C), temperature source Oral, resp. rate 17, SpO2 96.00%. Physical Exam: General: pleasant, obese white female who is laying in bed in NAD HEENT: head is normocephalic, atraumatic.  Sclera are noninjected.  PERRL.  Ears and nose without any masses or lesions.  Mouth is pink and moist Heart: regular, rate, and rhythm.  Normal s1,s2. No obvious murmurs, gallops, or rubs noted.  Palpable radial and pedal pulses bilaterally Lungs: CTAB, no wheezes, rhonchi, or rales noted.  Respiratory effort nonlabored Abd: soft, tender specifically on the left side of her abdomen around her old JP drain site.  There is purulent material coming from this old site.  It is a little more warm that the rest of her abdomen, but no frank erythema at this location.  There is a minimal amount of  erythema noted down along her pannus.  Her incision is well  healed with steri-strips intact.  No erythema noted here either., ND, +BS, no masses, hernias, or organomegaly MS: all 4 extremities are symmetrical with no cyanosis, clubbing, or edema. Skin: warm and dry with no masses, lesions, or rashes Psych: A&Ox3 with an appropriate affect.    Results for orders placed in visit on 10/18/11 (from the past 48 hour(s))  CBC WITH DIFFERENTIAL     Status: Abnormal   Collection Time   10/18/11  2:50 PM      Component Value Range Comment   WBC 22.4 (*) 4.0 - 10.5 K/uL    RBC 3.59 (*) 3.87 - 5.11 MIL/uL    Hemoglobin 10.9 (*) 12.0 - 15.0 g/dL    HCT 16.1 (*) 09.6 - 46.0 %    MCV 91.0  78.0 - 100.0 fL    MCH 30.4  26.0 - 34.0 pg    MCHC 33.5  30.0 - 36.0 g/dL    RDW 04.5  40.9 - 81.1 %    Platelets 214  150 - 400 K/uL    Neutrophils Relative 84 (*) 43 - 77 %    Neutro Abs 18.9 (*) 1.7 - 7.7 K/uL    Lymphocytes Relative 8 (*) 12 - 46 %    Lymphs Abs 1.7  0.7 - 4.0 K/uL    Monocytes Relative 8  3 - 12 %    Monocytes Absolute 1.7 (*) 0.1 - 1.0 K/uL    Smear Review Criteria for review not met     BASIC METABOLIC PANEL     Status: Normal   Collection Time   10/18/11  2:50 PM      Component Value Range Comment   Sodium 141  135 - 145 mEq/L    Potassium 4.7  3.5 - 5.3 mEq/L    Chloride 103  96 - 112 mEq/L    CO2 25  19 - 32 mEq/L    Glucose, Bld 99  70 - 99 mg/dL    BUN 22  6 - 23 mg/dL    Creat 9.14  7.82 - 9.56 mg/dL    Calcium 8.9  8.4 - 21.3 mg/dL    Ct Abdomen Pelvis W Contrast  10/18/2011  *RADIOLOGY REPORT*  Clinical Data: Abdominal pain, wound drainage, status post hernia repair  CT ABDOMEN AND PELVIS WITH CONTRAST  Technique:  Multidetector CT imaging of the abdomen and pelvis was performed following the standard protocol during bolus administration of intravenous contrast.  Contrast: OMNIPAQUE IOHEXOL 300 MG/ML  SOLN  Comparison: 05/01/2011  Findings: Sagittal images of the spine are unremarkable.  Mild degenerative changes lower lumbar  spine.  The  Lung bases are unremarkable.  Significant fatty infiltration of the liver again noted.  Spleen, pancreas and adrenal glands are unremarkable.  Kidneys are symmetrical in size and enhancement.  No hydronephrosis or hydroureter.  Oral contrast material was given to the patient.  No small bowel obstruction.  No free abdominal air.  No abdominal ascites is noted.  There is nonobstructive calcified calculus in the lower pole of the left kidney measures 5 mm. The patient is status post recent hernia repair.  There is a walled collection midline deep anterior abdominal wall between the rectus muscles and below the fascia. This collection is best visualized in the sagittal image 103 measures 14.7 cm cranial caudally and 4 cm in AP dimensional. In axial image 55 the collection is extending in the subcutaneous  fat midline anterior abdominal wall.  Best visualized in the axial image 100 measures 8.8 x 4 cm.  Some air is noted within the subcutaneous collection. These are highly suspicious for abscesses.  Clinical correlation is necessary.  There is also some extension in the left anterior abdominal wall subcutaneous fat just adjacent to midline axial image 52 measures 2.8 x 1.7 cm just above the fascia.  There is a tubular tract containing air extending to left cutaneous region. This is best visualized in axial image 41.  This is also suspicious for an abscess with a fistula drainage tract at the level of the skin. There is additional stranding of the subcutaneous fat bilateral anterior abdominal wall.  There is a skin thickening and subcutaneous stranding anterior lower abdomen and pelvic wall.  No pelvic ascites or adenopathy.  The uterus and adnexa are unremarkable.  The urinary bladder is unremarkable.  No distal colonic obstruction.  IMPRESSION:  1.There is a walled collection midline deep anterior abdominal wall between the rectus muscles and below the fascia. This collection is best visualized in the  sagittal image 103 measures 14.7 cm cranial caudally and 4 cm in AP dimensional. In axial image 55 the collection is extending in the subcutaneous fat midline anterior abdominal wall.  Best visualized in the axial image 100 measures 8.8 x 4 cm.  Some air is noted within the subcutaneous collection.  These are highly suspicious for abscesses.  Clinical correlation is necessary. 2.  Fatty infiltration of the liver again noted. 3.  There is skin thickening and stranding of subcutaneous fat anterior pelvic wall and lower abdominal wall. 4.  There is a second subcutaneous collection in the left anterior abdominal wall just adjacent to midline axial image 52 measures 2.8 x 1.7 cm.  There is a tubular tract containing air extending to left cutaneous region. This is best visualized in axial image 41. This is also suspicious for an abscess with a fistula drainage tract at the level of the skin.   Original Report Authenticated By: Natasha Mead, M.D.        Assessment/Plan 1. S/p VHR 2. Intra-abdominal abscess 3. Leukocytosis 4. Bipolar disorder 5. HTN  Plan: 1. Patient was admitted.  We will plan on radiology consult to see if this can be managed with a percutaneous drain.  If this fails, the patient may require a reoperation with possible explantation of mesh if this appears infected.  She will be started on IV abx therapy and kept NPO for now.  Mccune,Glori Machnik E 10/19/2011, 11:25 AM Pager: [phone]

## 2011-10-19 NOTE — Progress Notes (Signed)
UR complete 

## 2011-10-19 NOTE — Telephone Encounter (Signed)
Discussed her ct report and wbc report with her and recommended admission. She is agreeable

## 2011-10-20 LAB — CBC
MCV: 93.6 fL (ref 78.0–100.0)
Platelets: 247 10*3/uL (ref 150–400)
RBC: 3.42 MIL/uL — ABNORMAL LOW (ref 3.87–5.11)
WBC: 19.4 10*3/uL — ABNORMAL HIGH (ref 4.0–10.5)

## 2011-10-20 LAB — GLUCOSE, CAPILLARY
Glucose-Capillary: 132 mg/dL — ABNORMAL HIGH (ref 70–99)
Glucose-Capillary: 140 mg/dL — ABNORMAL HIGH (ref 70–99)
Glucose-Capillary: 143 mg/dL — ABNORMAL HIGH (ref 70–99)
Glucose-Capillary: 136 mg/dL — ABNORMAL HIGH (ref 70–99)

## 2011-10-20 LAB — BASIC METABOLIC PANEL
CO2: 25 mEq/L (ref 19–32)
Chloride: 99 mEq/L (ref 96–112)
Creatinine, Ser: 1.4 mg/dL — ABNORMAL HIGH (ref 0.50–1.10)
Potassium: 4.3 mEq/L (ref 3.5–5.1)
Sodium: 134 mEq/L — ABNORMAL LOW (ref 135–145)

## 2011-10-20 MED ORDER — WHITE PETROLATUM GEL
Status: AC
  Filled 2011-10-20: qty 5

## 2011-10-20 MED ORDER — ACETAMINOPHEN 325 MG PO TABS
650.0000 mg | ORAL_TABLET | ORAL | Status: DC | PRN
  Administered 2011-10-20 – 2011-10-23 (×2): 650 mg via ORAL
  Filled 2011-10-20 (×2): qty 2

## 2011-10-20 NOTE — Progress Notes (Signed)
Subjective: Sleepy; pain from drain site  Objective: Vital signs in last 24 hours: Temp:  [97.9 F (36.6 C)-103 F (39.4 C)] 101.7 F (38.7 C) (09/28 1004) Pulse Rate:  [71-95] 90  (09/28 1004) Resp:  [11-22] 22  (09/28 1004) BP: (102-145)/(42-92) 123/73 mmHg (09/28 1004) SpO2:  [93 %-100 %] 96 % (09/28 1004) Weight:  [332 lb (150.594 kg)] 332 lb (150.594 kg) (09/27 1100) Last BM Date: 10/18/11  Intake/Output from previous day: 09/27 0701 - 09/28 0700 In: 2265 [P.O.:360; I.V.:1905] Out: 770 [Urine:600; Drains:170] Intake/Output this shift:    General appearance: morbidly obese and sleepy Incision - c/d/i Old left-side drain site - less purulent drainage; minimal erythema Right-side drain - serous output   Lab Results:   Basename 10/19/11 1131 10/18/11 1450  WBC 19.1* 22.4*  HGB 10.1* 10.9*  HCT 31.2* 32.7*  PLT 197 214   BMET  Basename 10/19/11 1131 10/18/11 1450  NA 138 141  K 4.1 4.7  CL 101 103  CO2 27 25  GLUCOSE 125* 99  BUN 25* 22  CREATININE 1.15* 1.10  CALCIUM 8.5 8.9   PT/INR  Basename 10/19/11 1318  LABPROT 16.7*  INR 1.39   ABG No results found for this basename: PHART:2,PCO2:2,PO2:2,HCO3:2 in the last 72 hours  Studies/Results: Ct Guided Abscess Drain  10/19/2011  *RADIOLOGY REPORT*  Clinical Data: Intra-abdominal abscess, status post ventral hernia repair  CT GUIDED ABDOMINAL WALL ABSCESS DRAIN INSERTION  Date:  10/19/2011 16:40:00  Radiologist:  Judie Petit. Ruel Favors, M.D.  Medications:  Versed and Fentanyl for conscious sedation  Guidance:  CT  Sedation time:  15 minutes  Contrast volume:  None.  Complications:  No immediate  PROCEDURE/FINDINGS:  Informed consent was obtained from the patient following explanation of the procedure, risks, benefits and alternatives. The patient understands, agrees and consents for the procedure. All questions were addressed.  A time out was performed.  Maximal barrier sterile technique utilized including caps,  mask, sterile gowns, sterile gloves, large sterile drape, hand hygiene, and betadine  Previous imaging reviewed.  The patient was positioned supine. Noncontrast localization CT performed.  Midline abdominal wall abscess localized.  Under sterile conditions and local anesthesia, an 18 gauge 10 cm access needle was advanced from a right anterior oblique approach into the fluid collection.  Syringe aspiration yielded exudative fluid.  Samples sent for gram and culture.  Guide wire advanced followed tract dilatation to insert 12-French drain. Position confirmed within the abscess cavity with CT.  Syringe aspiration yielded 110 ml exudative fluid.  Catheter secured with a Prolene suture and connected to external suction bulb.  Sterile dressing applied.  No Immediate complication. The patient tolerated the procedure well.  IMPRESSION: Successful CT guided anterior abdominal wall abscess drain insertion.   Original Report Authenticated By: Judie Petit. Ruel Favors, M.D.    Ct Abdomen Pelvis W Contrast  10/18/2011  *RADIOLOGY REPORT*  Clinical Data: Abdominal pain, wound drainage, status post hernia repair  CT ABDOMEN AND PELVIS WITH CONTRAST  Technique:  Multidetector CT imaging of the abdomen and pelvis was performed following the standard protocol during bolus administration of intravenous contrast.  Contrast: OMNIPAQUE IOHEXOL 300 MG/ML  SOLN  Comparison: 05/01/2011  Findings: Sagittal images of the spine are unremarkable.  Mild degenerative changes lower lumbar spine.  The  Lung bases are unremarkable.  Significant fatty infiltration of the liver again noted.  Spleen, pancreas and adrenal glands are unremarkable.  Kidneys are symmetrical in size and enhancement.  No hydronephrosis or  hydroureter.  Oral contrast material was given to the patient.  No small bowel obstruction.  No free abdominal air.  No abdominal ascites is noted.  There is nonobstructive calcified calculus in the lower pole of the left kidney measures 5  mm. The patient is status post recent hernia repair.  There is a walled collection midline deep anterior abdominal wall between the rectus muscles and below the fascia. This collection is best visualized in the sagittal image 103 measures 14.7 cm cranial caudally and 4 cm in AP dimensional. In axial image 55 the collection is extending in the subcutaneous fat midline anterior abdominal wall.  Best visualized in the axial image 100 measures 8.8 x 4 cm.  Some air is noted within the subcutaneous collection. These are highly suspicious for abscesses.  Clinical correlation is necessary.  There is also some extension in the left anterior abdominal wall subcutaneous fat just adjacent to midline axial image 52 measures 2.8 x 1.7 cm just above the fascia.  There is a tubular tract containing air extending to left cutaneous region. This is best visualized in axial image 41.  This is also suspicious for an abscess with a fistula drainage tract at the level of the skin. There is additional stranding of the subcutaneous fat bilateral anterior abdominal wall.  There is a skin thickening and subcutaneous stranding anterior lower abdomen and pelvic wall.  No pelvic ascites or adenopathy.  The uterus and adnexa are unremarkable.  The urinary bladder is unremarkable.  No distal colonic obstruction.  IMPRESSION:  1.There is a walled collection midline deep anterior abdominal wall between the rectus muscles and below the fascia. This collection is best visualized in the sagittal image 103 measures 14.7 cm cranial caudally and 4 cm in AP dimensional. In axial image 55 the collection is extending in the subcutaneous fat midline anterior abdominal wall.  Best visualized in the axial image 100 measures 8.8 x 4 cm.  Some air is noted within the subcutaneous collection.  These are highly suspicious for abscesses.  Clinical correlation is necessary. 2.  Fatty infiltration of the liver again noted. 3.  There is skin thickening and stranding  of subcutaneous fat anterior pelvic wall and lower abdominal wall. 4.  There is a second subcutaneous collection in the left anterior abdominal wall just adjacent to midline axial image 52 measures 2.8 x 1.7 cm.  There is a tubular tract containing air extending to left cutaneous region. This is best visualized in axial image 41. This is also suspicious for an abscess with a fistula drainage tract at the level of the skin.   Original Report Authenticated By: Natasha Mead, M.D.     Anti-infectives: Anti-infectives     Start     Dose/Rate Route Frequency Ordered Stop   10/19/11 1400   acyclovir (ZOVIRAX) 200 MG capsule 400 mg        400 mg Oral 2 times daily 10/19/11 1122     10/19/11 1200   ertapenem (INVANZ) 1 g in sodium chloride 0.9 % 50 mL IVPB        1 g 100 mL/hr over 30 Minutes Intravenous Every 24 hours 10/19/11 1017            Assessment/Plan: s/p percutaneous drainage of post-op fluid collection after open ventral hernia repair with mesh Awaiting culture results - continue empiric antibiotics Mobilize/ ambulate Repeat labs today.   LOS: 1 day    Amanda Maltese K. 10/20/2011

## 2011-10-20 NOTE — Progress Notes (Signed)
Patient complaining of abdominal pain on left side. Rates pain at 8 of 10. Frequent requests for pain meds. I explained the negative side effects of too much dilaudid to patient and spouse. Patient is a/o, resp 16/min. Encouraged patient to try ambulating and repositioning to help with abdominal pain. Also encouraged patient to brace abd with pillow when TDBC. Lurline Idol [facility]

## 2011-10-20 NOTE — Progress Notes (Signed)
Subjective: No significant new complaints other than soreness at drain site placed 9/27 by IR. Feels that pain is not very controlled. ?? Patient informed plenty of pain meds are ordered for her.   Objective: Vital signs in last 24 hours: Temp:  [98.8 F (37.1 C)-103 F (39.4 C)] 101.7 F (38.7 C) (09/28 1004) Pulse Rate:  [71-95] 90  (09/28 1004) Resp:  [11-22] 22  (09/28 1004) BP: (102-145)/(42-92) 123/73 mmHg (09/28 1004) SpO2:  [93 %-100 %] 96 % (09/28 1004) Last BM Date: 10/18/11  Intake/Output from previous day: 09/27 0701 - 09/28 0700 In: 2265 [P.O.:360; I.V.:1905] Out: 770 [Urine:600; Drains:170]     PE:  Awake, alert.  Rt abdominal drain placed by IR intact with dressing clean and dry. Obese abdomen -tight near incision. Guarding with exam to right abdominal drain. 110 ml recorded output since placement yesterday.  Mostly amber tinged serous fluid in JP upon exam with appx. 15 ml in JP. Cultures pending with [facility] in pairs on initial report -final pending.   Lab Results:   Basename 10/20/11 1005 10/19/11 1131  WBC 19.4* 19.1*  HGB 10.2* 10.1*  HCT 32.0* 31.2*  PLT 247 197   BMET  Basename 10/20/11 1005 10/19/11 1131  NA 134* 138  K 4.3 4.1  CL 99 101  CO2 25 27  GLUCOSE 148* 125*  BUN 23 25*  CREATININE 1.40* 1.15*  CALCIUM 8.5 8.5   PT/INR  Basename 10/19/11 1318  LABPROT 16.7*  INR 1.39   Studies/Results: Ct Guided Abscess Drain  10/19/2011  *RADIOLOGY REPORT*  Clinical Data: Intra-abdominal abscess, status post ventral hernia repair  CT GUIDED ABDOMINAL WALL ABSCESS DRAIN INSERTION  Date:  10/19/2011 16:40:00  Radiologist:  Judie Petit. Ruel Favors, M.D.  Medications:  Versed and Fentanyl for conscious sedation  Guidance:  CT  Sedation time:  15 minutes  Contrast volume:  None.  Complications:  No immediate  PROCEDURE/FINDINGS:  Informed consent was obtained from the patient following explanation of the procedure, risks, benefits and alternatives. The patient  understands, agrees and consents for the procedure. All questions were addressed.  A time out was performed.  Maximal barrier sterile technique utilized including caps, mask, sterile gowns, sterile gloves, large sterile drape, hand hygiene, and betadine  Previous imaging reviewed.  The patient was positioned supine. Noncontrast localization CT performed.  Midline abdominal wall abscess localized.  Under sterile conditions and local anesthesia, an 18 gauge 10 cm access needle was advanced from a right anterior oblique approach into the fluid collection.  Syringe aspiration yielded exudative fluid.  Samples sent for gram and culture.  Guide wire advanced followed tract dilatation to insert 12-French drain. Position confirmed within the abscess cavity with CT.  Syringe aspiration yielded 110 ml exudative fluid.  Catheter secured with a Prolene suture and connected to external suction bulb.  Sterile dressing applied.  No Immediate complication. The patient tolerated the procedure well.  IMPRESSION: Successful CT guided anterior abdominal wall abscess drain insertion.   Original Report Authenticated By: Judie Petit. Ruel Favors, M.D.    Ct Abdomen Pelvis W Contrast  10/18/2011  *RADIOLOGY REPORT*  Clinical Data: Abdominal pain, wound drainage, status post hernia repair  CT ABDOMEN AND PELVIS WITH CONTRAST  Technique:  Multidetector CT imaging of the abdomen and pelvis was performed following the standard protocol during bolus administration of intravenous contrast.  Contrast: OMNIPAQUE IOHEXOL 300 MG/ML  SOLN  Comparison: 05/01/2011  Findings: Sagittal images of the spine are unremarkable.  Mild degenerative changes  lower lumbar spine.  The  Lung bases are unremarkable.  Significant fatty infiltration of the liver again noted.  Spleen, pancreas and adrenal glands are unremarkable.  Kidneys are symmetrical in size and enhancement.  No hydronephrosis or hydroureter.  Oral contrast material was given to the patient.  No  small bowel obstruction.  No free abdominal air.  No abdominal ascites is noted.  There is nonobstructive calcified calculus in the lower pole of the left kidney measures 5 mm. The patient is status post recent hernia repair.  There is a walled collection midline deep anterior abdominal wall between the rectus muscles and below the fascia. This collection is best visualized in the sagittal image 103 measures 14.7 cm cranial caudally and 4 cm in AP dimensional. In axial image 55 the collection is extending in the subcutaneous fat midline anterior abdominal wall.  Best visualized in the axial image 100 measures 8.8 x 4 cm.  Some air is noted within the subcutaneous collection. These are highly suspicious for abscesses.  Clinical correlation is necessary.  There is also some extension in the left anterior abdominal wall subcutaneous fat just adjacent to midline axial image 52 measures 2.8 x 1.7 cm just above the fascia.  There is a tubular tract containing air extending to left cutaneous region. This is best visualized in axial image 41.  This is also suspicious for an abscess with a fistula drainage tract at the level of the skin. There is additional stranding of the subcutaneous fat bilateral anterior abdominal wall.  There is a skin thickening and subcutaneous stranding anterior lower abdomen and pelvic wall.  No pelvic ascites or adenopathy.  The uterus and adnexa are unremarkable.  The urinary bladder is unremarkable.  No distal colonic obstruction.  IMPRESSION:  1.There is a walled collection midline deep anterior abdominal wall between the rectus muscles and below the fascia. This collection is best visualized in the sagittal image 103 measures 14.7 cm cranial caudally and 4 cm in AP dimensional. In axial image 55 the collection is extending in the subcutaneous fat midline anterior abdominal wall.  Best visualized in the axial image 100 measures 8.8 x 4 cm.  Some air is noted within the subcutaneous collection.   These are highly suspicious for abscesses.  Clinical correlation is necessary. 2.  Fatty infiltration of the liver again noted. 3.  There is skin thickening and stranding of subcutaneous fat anterior pelvic wall and lower abdominal wall. 4.  There is a second subcutaneous collection in the left anterior abdominal wall just adjacent to midline axial image 52 measures 2.8 x 1.7 cm.  There is a tubular tract containing air extending to left cutaneous region. This is best visualized in axial image 41. This is also suspicious for an abscess with a fistula drainage tract at the level of the skin.   Original Report Authenticated By: Natasha Mead, M.D.     Anti-infectives: Anti-infectives     Start     Dose/Rate Route Frequency Ordered Stop   10/19/11 1400   acyclovir (ZOVIRAX) 200 MG capsule 400 mg        400 mg Oral 2 times daily 10/19/11 1122     10/19/11 1200   ertapenem (INVANZ) 1 g in sodium chloride 0.9 % 50 mL IVPB        1 g 100 mL/hr over 30 Minutes Intravenous Every 24 hours 10/19/11 1017            Assessment/Plan: Post operative fluid collection s/p  percutaneous drainage by IR 9/27 - day #2 with this drain. Continue flushes, monitor output and follow up on final cultures. IR to continue to follow.   \\ LOS: 1 day    CAMPBELL,PAMELA D 10/20/2011

## 2011-10-20 NOTE — Progress Notes (Signed)
Temp orally was 102.4.  Dr. [name] called.

## 2011-10-20 NOTE — Progress Notes (Signed)
Patient asleep but aroused for scheduled med administration. Is oriented but mildly lethargic r/t IV pain meds given earlier. Patient placed on 2L [place] due to apneic respirations while asleep. No c/o pain at this time. Lurline Idol [facility]

## 2011-10-21 ENCOUNTER — Inpatient Hospital Stay (HOSPITAL_COMMUNITY): Payer: Medicaid Other

## 2011-10-21 LAB — WOUND CULTURE: Gram Stain: NONE SEEN

## 2011-10-21 LAB — BASIC METABOLIC PANEL
Calcium: 8.4 mg/dL (ref 8.4–10.5)
Creatinine, Ser: 1.34 mg/dL — ABNORMAL HIGH (ref 0.50–1.10)
GFR calc non Af Amer: 50 mL/min — ABNORMAL LOW (ref >90)
Sodium: 133 mEq/L — ABNORMAL LOW (ref 135–145)

## 2011-10-21 LAB — CBC
MCH: 29.6 pg (ref 26.0–34.0)
MCHC: 31.5 g/dL (ref 30.0–36.0)
RDW: 15.6 % — ABNORMAL HIGH (ref 11.5–15.5)

## 2011-10-21 LAB — GLUCOSE, CAPILLARY
Glucose-Capillary: 116 mg/dL — ABNORMAL HIGH (ref 70–99)
Glucose-Capillary: 87 mg/dL (ref 70–99)

## 2011-10-21 MED ORDER — SODIUM CHLORIDE 0.9 % IV SOLN
2500.0000 mg | INTRAVENOUS | Status: AC
  Administered 2011-10-21: 2500 mg via INTRAVENOUS
  Filled 2011-10-21 (×2): qty 2500

## 2011-10-21 MED ORDER — IOHEXOL 300 MG/ML  SOLN: 80.0000 mL | Freq: Once | INTRAMUSCULAR | Status: AC | PRN

## 2011-10-21 MED ORDER — IOHEXOL 300 MG/ML  SOLN
20.0000 mL | INTRAMUSCULAR | Status: AC
  Administered 2011-10-21: 20 mL via ORAL

## 2011-10-21 MED ORDER — VANCOMYCIN HCL 1000 MG IV SOLR
1500.0000 mg | Freq: Two times a day (BID) | INTRAVENOUS | Status: DC
  Administered 2011-10-21 – 2011-10-22 (×2): 1500 mg via INTRAVENOUS
  Filled 2011-10-21 (×3): qty 1500

## 2011-10-21 MED ORDER — ALBUTEROL SULFATE HFA 108 (90 BASE) MCG/ACT IN AERS
2.0000 | INHALATION_SPRAY | Freq: Four times a day (QID) | RESPIRATORY_TRACT | Status: DC | PRN
  Administered 2011-10-24: 2 via RESPIRATORY_TRACT
  Filled 2011-10-21 (×2): qty 6.7

## 2011-10-21 NOTE — Progress Notes (Signed)
Subjective: Still with moderate amount of pain near incision - abdominal drain.   Objective: Vital signs in last 24 hours: Temp:  [99.2 F (37.3 C)-102.4 F (39.1 C)] 99.7 F (37.6 C) (09/29 1018) Pulse Rate:  [74-92] 77  (09/29 1018) Resp:  [18-22] 22  (09/29 1018) BP: (100-132)/(49-70) 132/69 mmHg (09/29 1018) SpO2:  [90 %-97 %] 97 % (09/29 1018) Last BM Date: 10/19/11  Intake/Output from previous day: 09/28 0701 - 09/29 0700 In: 2723 [P.O.:1155; I.V.:1568] Out: 1640 [Urine:1500; Drains:140] Intake/Output this shift:    PE:  Obese abdomen with drain intact - insertion site clean and dry. Blood tinged serous drainage in JP bulb.  140 ml output last 24 hours - staph on cultures - WBC elevated today and remains febrile -for repeat CT to ensure no new apparent collections needing addressed. Positive bowel sounds with tenderness to palpation at lower abdomen.    Lab Results:   Basename 10/21/11 0430 10/20/11 1005  WBC 21.6* 19.4*  HGB 9.2* 10.2*  HCT 29.2* 32.0*  PLT 260 247   BMET  Basename 10/21/11 0430 10/20/11 1005  NA 133* 134*  K 4.5 4.3  CL 97 99  CO2 25 25  GLUCOSE 122* 148*  BUN 27* 23  CREATININE 1.34* 1.40*  CALCIUM 8.4 8.5   PT/INR  Basename 10/19/11 1318  LABPROT 16.7*  INR 1.39   Studies/Results: Ct Guided Abscess Drain  10/19/2011  *RADIOLOGY REPORT*  Clinical Data: Intra-abdominal abscess, status post ventral hernia repair  CT GUIDED ABDOMINAL WALL ABSCESS DRAIN INSERTION  Date:  10/19/2011 16:40:00  Radiologist:  Judie Petit. Ruel Favors, M.D.  Medications:  Versed and Fentanyl for conscious sedation  Guidance:  CT  Sedation time:  15 minutes  Contrast volume:  None.  Complications:  No immediate  PROCEDURE/FINDINGS:  Informed consent was obtained from the patient following explanation of the procedure, risks, benefits and alternatives. The patient understands, agrees and consents for the procedure. All questions were addressed.  A time out was performed.   Maximal barrier sterile technique utilized including caps, mask, sterile gowns, sterile gloves, large sterile drape, hand hygiene, and betadine  Previous imaging reviewed.  The patient was positioned supine. Noncontrast localization CT performed.  Midline abdominal wall abscess localized.  Under sterile conditions and local anesthesia, an 18 gauge 10 cm access needle was advanced from a right anterior oblique approach into the fluid collection.  Syringe aspiration yielded exudative fluid.  Samples sent for gram and culture.  Guide wire advanced followed tract dilatation to insert 12-French drain. Position confirmed within the abscess cavity with CT.  Syringe aspiration yielded 110 ml exudative fluid.  Catheter secured with a Prolene suture and connected to external suction bulb.  Sterile dressing applied.  No Immediate complication. The patient tolerated the procedure well.  IMPRESSION: Successful CT guided anterior abdominal wall abscess drain insertion.   Original Report Authenticated By: Judie Petit. Ruel Favors, M.D.     Anti-infectives: Anti-infectives     Start     Dose/Rate Route Frequency Ordered Stop   10/21/11 2200   vancomycin (VANCOCIN) 1,500 mg in sodium chloride 0.9 % 500 mL IVPB        1,500 mg 250 mL/hr over 120 Minutes Intravenous Every 12 hours 10/21/11 0945     10/21/11 1000   vancomycin (VANCOCIN) 2,500 mg in sodium chloride 0.9 % 500 mL IVPB        2,500 mg 250 mL/hr over 120 Minutes Intravenous NOW 10/21/11 0945 10/22/11 1000   10/19/11  1400   acyclovir (ZOVIRAX) 200 MG capsule 400 mg        400 mg Oral 2 times daily 10/19/11 1122     10/19/11 1200   ertapenem (INVANZ) 1 g in sodium chloride 0.9 % 50 mL IVPB  Status:  Discontinued        1 g 100 mL/hr over 30 Minutes Intravenous Every 24 hours 10/19/11 1017 10/21/11 0944          Assessment/Plan: Post operative abdominal wall abscess s/p percutaneous drainage by IR to RL abdomen 9/27.  For repeat CT today to re-evaluate  abdomen given fevers, leukocytosis and persistent pain. Will follow up on results.     LOS: 2 days    Tanyika Barros D 10/21/2011

## 2011-10-21 NOTE — Progress Notes (Signed)
Abdominal abscess  Assessment: Abdominal abscess Abdominal wall abscess status post ventral hernia repair with mesh, status post percutaneous drainage by interventional radiology with continued fevers and somewhat increased discomfort around the incision. Review of the CT scan the radiologist suggests there may be a second pocket is not completely drained. In addition with her increasing discomfort in the lower left portion of the incision there may be a second pocket developing. On the original CT this just looked inflammatory.her culture is growing staph, not clear whether this is MRSA  Plan: 1. Will repeat a CT scan today to see if there is any evidence of an undrained pocket or worsening of the inflammatory process. 2. Will start vancomycin since he remains febrile on Invanz and we know of staph was growing. 3. May need to go to the operating room to have this entire area open up and likely mesh removal but will make further decisions following review of CT scan. 4. Will start wound isolation   Subjective: She continues to have discomfort in the incision with a little bit more pain in the left lower aspect of the incision. She is not having any nausea or vomiting. No other abdominal complaints. Has noted a little bit of edema of her ankles.  Objective: Vital signs in last 24 hours: Temp:  [99.2 F (37.3 C)-102.4 F (39.1 C)] 99.2 F (37.3 C) (09/29 0700) Pulse Rate:  [74-92] 81  (09/29 0700) Resp:  [18-22] 18  (09/29 0700) BP: (100-132)/(49-73) 132/59 mmHg (09/29 0700) SpO2:  [90 %-96 %] 94 % (09/29 0700) Last BM Date: 10/19/11  Intake/Output from previous day: 09/28 0701 - 09/29 0700 In: 2723 [P.O.:1155; I.V.:1568] Out: 1640 [Urine:1500; Drains:140] Intake/Output this shift:    General appearance: alert, cooperative and no distress Resp: clear to auscultation bilaterally GI: the abdomen is basically benign. There is a drain coming on the right side. There is a little bit  more induration and a little bit of erythema the lower left of the incision. I don't appreciate any fluctuance. The area of the wound remains tender. Bowel sounds are present. the drain remains somewhat serous. Does not look purulent.  Lab Results:  Results for orders placed during the hospital encounter of 10/19/11 (from the past 24 hour(s))  CBC     Status: Abnormal   Collection Time   10/20/11 10:05 AM      Component Value Range   WBC 19.4 (*) 4.0 - 10.5 K/uL   RBC 3.42 (*) 3.87 - 5.11 MIL/uL   Hemoglobin 10.2 (*) 12.0 - 15.0 g/dL   HCT 81.1 (*) 91.4 - 78.2 %   MCV 93.6  78.0 - 100.0 fL   MCH 29.8  26.0 - 34.0 pg   MCHC 31.9  30.0 - 36.0 g/dL   RDW 95.6 (*) 21.3 - 08.6 %   Platelets 247  150 - 400 K/uL  BASIC METABOLIC PANEL     Status: Abnormal   Collection Time   10/20/11 10:05 AM      Component Value Range   Sodium 134 (*) 135 - 145 mEq/L   Potassium 4.3  3.5 - 5.1 mEq/L   Chloride 99  96 - 112 mEq/L   CO2 25  19 - 32 mEq/L   Glucose, Bld 148 (*) 70 - 99 mg/dL   BUN 23  6 - 23 mg/dL   Creatinine, Ser 5.78 (*) 0.50 - 1.10 mg/dL   Calcium 8.5  8.4 - 46.9 mg/dL   GFR calc non Af  Amer 47 (*) >90 mL/min   GFR calc Af Amer 55 (*) >90 mL/min  GLUCOSE, CAPILLARY     Status: Abnormal   Collection Time   10/20/11 12:28 PM      Component Value Range   Glucose-Capillary 140 (*) 70 - 99 mg/dL   Comment 1 Notify RN    GLUCOSE, CAPILLARY     Status: Abnormal   Collection Time   10/20/11  5:20 PM      Component Value Range   Glucose-Capillary 143 (*) 70 - 99 mg/dL   Comment 1 Notify RN    GLUCOSE, CAPILLARY     Status: Abnormal   Collection Time   10/20/11 10:04 PM      Component Value Range   Glucose-Capillary 136 (*) 70 - 99 mg/dL   Comment 1 Notify RN    BASIC METABOLIC PANEL     Status: Abnormal   Collection Time   10/21/11  4:30 AM      Component Value Range   Sodium 133 (*) 135 - 145 mEq/L   Potassium 4.5  3.5 - 5.1 mEq/L   Chloride 97  96 - 112 mEq/L   CO2 25  19 - 32  mEq/L   Glucose, Bld 122 (*) 70 - 99 mg/dL   BUN 27 (*) 6 - 23 mg/dL   Creatinine, Ser 1.61 (*) 0.50 - 1.10 mg/dL   Calcium 8.4  8.4 - 09.6 mg/dL   GFR calc non Af Amer 50 (*) >90 mL/min   GFR calc Af Amer 58 (*) >90 mL/min  CBC     Status: Abnormal   Collection Time   10/21/11  4:30 AM      Component Value Range   WBC 21.6 (*) 4.0 - 10.5 K/uL   RBC 3.11 (*) 3.87 - 5.11 MIL/uL   Hemoglobin 9.2 (*) 12.0 - 15.0 g/dL   HCT 04.5 (*) 40.9 - 81.1 %   MCV 93.9  78.0 - 100.0 fL   MCH 29.6  26.0 - 34.0 pg   MCHC 31.5  30.0 - 36.0 g/dL   RDW 91.4 (*) 78.2 - 95.6 %   Platelets 260  150 - 400 K/uL     Studies/Results Radiology     MEDS, Scheduled    . acyclovir  400 mg Oral BID  . atenolol  50 mg Oral QHS  . cycloSPORINE  200 mg Oral BID  . divalproex  500 mg Oral BID  . enoxaparin  40 mg Subcutaneous Q24H  . ertapenem (INVANZ) IV  1 g Intravenous Q24H  . insulin aspart  0-20 Units Subcutaneous TID WC  . insulin aspart  0-5 Units Subcutaneous QHS  . levothyroxine  112 mcg Oral QAC breakfast  . pantoprazole (PROTONIX) IV  40 mg Intravenous QHS  . white petrolatum           LOS: 2 days    Currie Paris, MD, [facility], [place] [phone]   10/21/2011 8:40 AM

## 2011-10-21 NOTE — Progress Notes (Signed)
The patient has a deeper subfascial abscess that needs to be drained or aspirated.  I have spoken with the radiologist who will follow-up with the IR service tomorrow.  Marta Lamas. Gae Bon, MD, FACS [phone] [facility]

## 2011-10-21 NOTE — Progress Notes (Signed)
ANTIBIOTIC CONSULT NOTE - INITIAL  Pharmacy Consult:  Vancomycin Indication:  S. aureus infection (abdominal abscess drainage)  Allergies  Allergen Reactions  . Cephalexin Anaphylaxis    But can tolerate Ceftin and Amoxicillin per patient  . Latex Itching  . Lisinopril Cough  . Statins Other (See Comments)    Back pain   . Tea Rash    Patient Measurements: Height: 5\' 5"  (165.1 cm) Weight: 332 lb (150.594 kg) IBW/kg (Calculated) : 57   Vital Signs: Temp: 99.2 F (37.3 C) (09/29 0700) Temp src: Oral (09/28 2137) BP: 132/59 mmHg (09/29 0700) Pulse Rate: 81  (09/29 0700) Intake/Output from previous day: 09/28 0701 - 09/29 0700 In: 2723 [P.O.:1155; I.V.:1568] Out: 1640 [Urine:1500; Drains:140]  Labs:  Basename 10/21/11 0430 10/20/11 1005 10/19/11 1131  WBC 21.6* 19.4* 19.1*  HGB 9.2* 10.2* 10.1*  PLT 260 247 197  LABCREA -- -- --  CREATININE 1.34* 1.40* 1.15*   Estimated Creatinine Clearance: 85.7 ml/min (by C-G formula based on Cr of 1.34). No results found for this basename: VANCOTROUGH:2,VANCOPEAK:2,VANCORANDOM:2,GENTTROUGH:2,GENTPEAK:2,GENTRANDOM:2,TOBRATROUGH:2,TOBRAPEAK:2,TOBRARND:2,AMIKACINPEAK:2,AMIKACINTROU:2,AMIKACIN:2, in the last 72 hours   Microbiology: Recent Results (from the past 720 hour(s))  WOUND CULTURE     Status: Normal   Collection Time   10/18/11  2:47 PM      Component Value Range Status Comment   Culture Moderate STAPHYLOCOCCUS AUREUS   Final    GRAM STAIN Abundant   Final    GRAM STAIN WBC present-predominately PMN   Final    GRAM STAIN No Squamous Epithelial Cells Seen   Final    GRAM STAIN Moderate Gram Positive Cocci In Pairs In Clusters   Final    Organism ID, Bacteria STAPHYLOCOCCUS AUREUS   Final   CULTURE, ROUTINE-ABSCESS     Status: Normal (Preliminary result)   Collection Time   10/19/11  4:32 PM      Component Value Range Status Comment   Specimen Description ABSCESS ABDOMEN DRAINAGE   Final    Special Requests Normal   Final     Gram Stain     Final    Value: ABUNDANT WBC PRESENT, PREDOMINANTLY PMN     NO SQUAMOUS EPITHELIAL CELLS SEEN     FEW GRAM POSITIVE COCCI     IN PAIRS   Culture     Final    Value: ABUNDANT STAPHYLOCOCCUS AUREUS     Note: RIFAMPIN AND GENTAMICIN SHOULD NOT BE USED AS SINGLE DRUGS FOR TREATMENT OF STAPH INFECTIONS.   Report Status PENDING   Incomplete     Medical History: Past Medical History  Diagnosis Date  . Diabetes mellitus   . Migraine   . Hypoactive thyroid   . Cancer     cervical  . Osteoporosis   . Hypertension   . High cholesterol   . Anginal pain 2011    "stress per Dr Pang"-occ at present from increased stress  . Dysrhythmia     hx MVP  . GERD (gastroesophageal reflux disease)   . Chronic kidney disease     stones in past,  hematuria daily- states hasnt been evaluated due to lack of insurance  . Psoriasis     scattered throughout body- large amount- states increased with stress of surgery  . Fatty liver     with gall bladder sludge by pt history/ no stones  . Stroke     ? subacranoid bleed-  no defits  . Mental disorder     bipolar  . Anxiety   . Sleep  apnea      STOP BANG SCORE 6  . Ventral hernia 09/13/2011  . Depression   . Shortness of breath     with exertion/   ? adult onset asthma  . Pneumonia     hx of   . Arthritis   . Anemia     hx of anemia during pregnancy   . PONV (postoperative nausea and vomiting)     also hard to get asleep per pt   . Bipolar disorder         Assessment: 42 YOF with abdominal abscess s/p drainage.  Culture growing S.aureus (preliminary) and pharmacy asked to dose and monitor vancomycin therapy.  Patient was on Invanz and continued to have fevers and elevated WBC.  Concern that it will be MRSA d/t his history of MRSA infection.  Patient's renal function is starting to improve.  Invanz 9/27 >> 9/29 Acyclovir continued from home med Vanc 9/29 >>    Goal of Therapy:  Vanc trough ~15 mcg/mL    Plan:  -  Vanc 2500mg  IV load, then 1500mg  IV Q12H - Monitor renal fxn and vanc trough as indicated - F/U identification of species to de-escalate abx - D/C Invanz per discussion with Surgery     Shekelia Boutin D. Laney Potash, PharmD, BCPS Pager:  [phone] 10/21/2011, 9:44 AM

## 2011-10-21 NOTE — Progress Notes (Deleted)
MD on call for [facility] paged by answering service with message to read today's CT scan result per radiologist. No call back received. Lurline Idol [employer] [facility]

## 2011-10-21 NOTE — Progress Notes (Signed)
Patient has audible wheezing, nonproductive cough, and increased lower extremity edema. MD notified.[name] [facility]

## 2011-10-22 ENCOUNTER — Encounter (HOSPITAL_COMMUNITY): Payer: Self-pay | Admitting: Anesthesiology

## 2011-10-22 ENCOUNTER — Encounter (HOSPITAL_COMMUNITY)
Admission: AD | Disposition: A | Discharge status: Home / Self Care | Payer: Self-pay | Source: Ambulatory Visit | Attending: Surgery

## 2011-10-22 ENCOUNTER — Inpatient Hospital Stay (HOSPITAL_COMMUNITY): Payer: Medicaid Other | Admitting: Anesthesiology

## 2011-10-22 DIAGNOSIS — T8140XA Infection following a procedure, unspecified, initial encounter: Secondary | ICD-10-CM | POA: Diagnosis not present

## 2011-10-22 DIAGNOSIS — Y838 Other surgical procedures as the cause of abnormal reaction of the patient, or of later complication, without mention of misadventure at the time of the procedure: Secondary | ICD-10-CM

## 2011-10-22 DIAGNOSIS — K660 Peritoneal adhesions (postprocedural) (postinfection): Secondary | ICD-10-CM

## 2011-10-22 HISTORY — PX: LAPAROSCOPY: SHX197

## 2011-10-22 LAB — COMPREHENSIVE METABOLIC PANEL
ALT: 11 U/L (ref 0–35)
Albumin: 1.9 g/dL — ABNORMAL LOW (ref 3.5–5.2)
Alkaline Phosphatase: 94 U/L (ref 39–117)
GFR calc Af Amer: 76 mL/min — ABNORMAL LOW (ref >90)
Glucose, Bld: 108 mg/dL — ABNORMAL HIGH (ref 70–99)
Potassium: 5.2 mEq/L — ABNORMAL HIGH (ref 3.5–5.1)
Sodium: 134 mEq/L — ABNORMAL LOW (ref 135–145)
Total Protein: 5.8 g/dL — ABNORMAL LOW (ref 6.0–8.3)

## 2011-10-22 LAB — GLUCOSE, CAPILLARY: Glucose-Capillary: 86 mg/dL (ref 70–99)

## 2011-10-22 LAB — BASIC METABOLIC PANEL
BUN: 25 mg/dL — ABNORMAL HIGH (ref 6–23)
CO2: 26 mEq/L (ref 19–32)
Chloride: 98 mEq/L (ref 96–112)
Creatinine, Ser: 1.16 mg/dL — ABNORMAL HIGH (ref 0.50–1.10)
Potassium: 4.4 mEq/L (ref 3.5–5.1)

## 2011-10-22 LAB — CULTURE, ROUTINE-ABSCESS: Special Requests: NORMAL

## 2011-10-22 LAB — CBC
HCT: 28.3 % — ABNORMAL LOW (ref 36.0–46.0)
Hemoglobin: 8.8 g/dL — ABNORMAL LOW (ref 12.0–15.0)
MCH: 29.1 pg (ref 26.0–34.0)
MCV: 93.7 fL (ref 78.0–100.0)
RBC: 3.02 MIL/uL — ABNORMAL LOW (ref 3.87–5.11)

## 2011-10-22 LAB — MRSA PCR SCREENING: MRSA by PCR: NEGATIVE

## 2011-10-22 SURGERY — LAPAROSCOPY, DIAGNOSTIC
Anesthesia: General | Site: Abdomen | Wound class: Clean

## 2011-10-22 MED ORDER — ONDANSETRON HCL 4 MG/2ML IJ SOLN
INTRAMUSCULAR | Status: AC
  Filled 2011-10-22: qty 2

## 2011-10-22 MED ORDER — FENTANYL CITRATE 0.05 MG/ML IJ SOLN
INTRAMUSCULAR | Status: DC | PRN
  Administered 2011-10-22 (×2): 50 ug via INTRAVENOUS
  Administered 2011-10-22: 100 ug via INTRAVENOUS
  Administered 2011-10-22 (×5): 50 ug via INTRAVENOUS

## 2011-10-22 MED ORDER — NEOSTIGMINE METHYLSULFATE 1 MG/ML IJ SOLN
INTRAMUSCULAR | Status: DC | PRN
  Administered 2011-10-22: 5 mg via INTRAVENOUS

## 2011-10-22 MED ORDER — ROCURONIUM BROMIDE 100 MG/10ML IV SOLN
INTRAVENOUS | Status: DC | PRN
  Administered 2011-10-22: 10 mg via INTRAVENOUS
  Administered 2011-10-22: 20 mg via INTRAVENOUS
  Administered 2011-10-22: 10 mg via INTRAVENOUS
  Administered 2011-10-22: 50 mg via INTRAVENOUS
  Administered 2011-10-22 (×2): 10 mg via INTRAVENOUS

## 2011-10-22 MED ORDER — GLYCOPYRROLATE 0.2 MG/ML IJ SOLN
INTRAMUSCULAR | Status: DC | PRN
  Administered 2011-10-22: .8 mg via INTRAVENOUS

## 2011-10-22 MED ORDER — SODIUM CHLORIDE 0.9 % IV SOLN
900.0000 mg | INTRAVENOUS | Status: DC
  Administered 2011-10-22: 900 mg via INTRAVENOUS
  Filled 2011-10-22 (×2): qty 18

## 2011-10-22 MED ORDER — ONDANSETRON HCL 4 MG/2ML IJ SOLN
4.0000 mg | Freq: Once | INTRAMUSCULAR | Status: AC | PRN
  Administered 2011-10-22: 4 mg via INTRAVENOUS

## 2011-10-22 MED ORDER — RIFAMPIN 300 MG PO CAPS
300.0000 mg | ORAL_CAPSULE | Freq: Every day | ORAL | Status: DC
  Administered 2011-10-22 – 2011-10-23 (×2): 300 mg via ORAL
  Filled 2011-10-22 (×3): qty 1

## 2011-10-22 MED ORDER — SUCCINYLCHOLINE CHLORIDE 20 MG/ML IJ SOLN
INTRAMUSCULAR | Status: DC | PRN
  Administered 2011-10-22: 100 mg via INTRAVENOUS

## 2011-10-22 MED ORDER — LACTATED RINGERS IV SOLN
INTRAVENOUS | Status: DC | PRN
  Administered 2011-10-22: 10:00:00 via INTRAVENOUS

## 2011-10-22 MED ORDER — ONDANSETRON HCL 4 MG/2ML IJ SOLN
INTRAMUSCULAR | Status: DC | PRN
  Administered 2011-10-22: 4 mg via INTRAVENOUS

## 2011-10-22 MED ORDER — ACETAMINOPHEN 10 MG/ML IV SOLN
1000.0000 mg | Freq: Once | INTRAVENOUS | Status: AC | PRN
  Administered 2011-10-22: 1000 mg via INTRAVENOUS

## 2011-10-22 MED ORDER — MIDAZOLAM HCL 5 MG/5ML IJ SOLN
INTRAMUSCULAR | Status: DC | PRN
  Administered 2011-10-22: 2 mg via INTRAVENOUS

## 2011-10-22 MED ORDER — LIDOCAINE HCL (CARDIAC) 20 MG/ML IV SOLN
INTRAVENOUS | Status: DC | PRN
  Administered 2011-10-22: 40 mg via INTRAVENOUS

## 2011-10-22 MED ORDER — PROPOFOL 10 MG/ML IV BOLUS
INTRAVENOUS | Status: DC | PRN
  Administered 2011-10-22: 170 mg via INTRAVENOUS

## 2011-10-22 MED ORDER — SODIUM CHLORIDE 0.9 % IR SOLN
Status: DC | PRN
  Administered 2011-10-22 (×3): 1000 mL

## 2011-10-22 MED ORDER — HYDROMORPHONE HCL PF 1 MG/ML IJ SOLN
0.2500 mg | INTRAMUSCULAR | Status: DC | PRN
  Administered 2011-10-22 (×2): 0.5 mg via INTRAVENOUS

## 2011-10-22 SURGICAL SUPPLY — 60 items
ADH SKN CLS APL DERMABOND .7 (GAUZE/BANDAGES/DRESSINGS) ×3
APPLICATOR COTTON TIP 6IN STRL (MISCELLANEOUS) ×6 IMPLANT
BLADE SURG ROTATE 9660 (MISCELLANEOUS) IMPLANT
CANISTER SUCTION 2500CC (MISCELLANEOUS) ×4 IMPLANT
CHLORAPREP W/TINT 26ML (MISCELLANEOUS) ×4 IMPLANT
CLOTH BEACON ORANGE TIMEOUT ST (SAFETY) ×4 IMPLANT
COVER SURGICAL LIGHT HANDLE (MISCELLANEOUS) ×4 IMPLANT
DERMABOND ADVANCED (GAUZE/BANDAGES/DRESSINGS) ×1
DERMABOND ADVANCED .7 DNX12 (GAUZE/BANDAGES/DRESSINGS) ×2 IMPLANT
DRAPE CAMERA CLOSED 9X96 (DRAPES) ×2 IMPLANT
DRAPE LAPAROSCOPIC ABDOMINAL (DRAPES) ×4 IMPLANT
DRAPE LAPAROTOMY 100X72X124 (DRAPES) IMPLANT
DRAPE UTILITY 15X26 W/TAPE STR (DRAPE) ×8 IMPLANT
DRAPE WARM FLUID 44X44 (DRAPE) ×4 IMPLANT
ELECT CAUTERY BLADE 6.4 (BLADE) ×4 IMPLANT
ELECT REM PT RETURN 9FT ADLT (ELECTROSURGICAL) ×4
ELECTRODE REM PT RTRN 9FT ADLT (ELECTROSURGICAL) ×3 IMPLANT
GAUZE SPONGE 4X4 16PLY XRAY LF (GAUZE/BANDAGES/DRESSINGS) ×2 IMPLANT
GLOVE BIO SURGEON STRL SZ7 (GLOVE) ×2 IMPLANT
GLOVE BIO SURGEON STRL SZ7.5 (GLOVE) ×2 IMPLANT
GLOVE BIOGEL PI IND STRL 6.5 (GLOVE) ×1 IMPLANT
GLOVE BIOGEL PI IND STRL 7.5 (GLOVE) ×5 IMPLANT
GLOVE BIOGEL PI INDICATOR 6.5 (GLOVE) ×1
GLOVE BIOGEL PI INDICATOR 7.5 (GLOVE) ×3
GLOVE SURG SS PI 6.5 STRL IVOR (GLOVE) ×9 IMPLANT
GLOVE SURG SS PI 7.0 STRL IVOR (GLOVE) ×3 IMPLANT
GLOVE SURG SS PI 7.5 STRL IVOR (GLOVE) ×3 IMPLANT
GOWN SRG XL XLNG 56XLVL 4 (GOWN DISPOSABLE) ×4 IMPLANT
GOWN STRL NON-REIN LRG LVL3 (GOWN DISPOSABLE) ×8 IMPLANT
GOWN STRL NON-REIN XL XLG LVL4 (GOWN DISPOSABLE) ×8
GOWN STRL REIN 2XL XLG LVL4 (GOWN DISPOSABLE) ×2 IMPLANT
HOVERMATT SINGLE USE (MISCELLANEOUS) ×3 IMPLANT
KIT BASIN OR (CUSTOM PROCEDURE TRAY) ×4 IMPLANT
KIT ROOM TURNOVER OR (KITS) ×4 IMPLANT
LIGASURE IMPACT 36 18CM CVD LR (INSTRUMENTS) IMPLANT
NS IRRIG 1000ML POUR BTL (IV SOLUTION) ×7 IMPLANT
PACK GENERAL/GYN (CUSTOM PROCEDURE TRAY) ×4 IMPLANT
PAD ARMBOARD 7.5X6 YLW CONV (MISCELLANEOUS) ×8 IMPLANT
SET IRRIG TUBING LAPAROSCOPIC (IRRIGATION / IRRIGATOR) ×2 IMPLANT
SOLUTION ANTI FOG 6CC (MISCELLANEOUS) ×3 IMPLANT
SPECIMEN JAR LARGE (MISCELLANEOUS) IMPLANT
SPONGE GAUZE 4X4 12PLY (GAUZE/BANDAGES/DRESSINGS) ×4 IMPLANT
SPONGE LAP 18X18 X RAY DECT (DISPOSABLE) IMPLANT
STAPLER VISISTAT 35W (STAPLE) ×1 IMPLANT
SUCTION POOLE TIP (SUCTIONS) ×3 IMPLANT
SUT PDS AB 1 TP1 96 (SUTURE) IMPLANT
SUT SILK 2 0 SH CR/8 (SUTURE) ×1 IMPLANT
SUT SILK 2 0 TIES 10X30 (SUTURE) ×4 IMPLANT
SUT SILK 3 0 SH CR/8 (SUTURE) ×2 IMPLANT
SUT SILK 3 0 TIES 10X30 (SUTURE) ×4 IMPLANT
SUT VIC AB 3-0 SH 18 (SUTURE) IMPLANT
SUT VICRYL 4-0 PS2 18IN ABS (SUTURE) ×2 IMPLANT
TOWEL OR 17X24 6PK STRL BLUE (TOWEL DISPOSABLE) ×4 IMPLANT
TOWEL OR 17X26 10 PK STRL BLUE (TOWEL DISPOSABLE) ×4 IMPLANT
TRAY FOLEY CATH 14FRSI W/METER (CATHETERS) ×2 IMPLANT
TROCAR 5M 150ML BLDLS (TROCAR) ×4 IMPLANT
TUBING INSUFFLATION 10FT LAP (TUBING) ×2 IMPLANT
UNDERPAD 30X30 INCONTINENT (UNDERPADS AND DIAPERS) IMPLANT
WATER STERILE IRR 1000ML POUR (IV SOLUTION) ×1 IMPLANT
YANKAUER SUCT BULB TIP NO VENT (SUCTIONS) ×3 IMPLANT

## 2011-10-22 NOTE — Brief Op Note (Signed)
10/19/2011 - 10/22/2011  1:08 PM  PATIENT:  [name]  36 y.o. female  PRE-OPERATIVE DIAGNOSIS:  abscess  POST-OPERATIVE DIAGNOSIS:  post-op seroma  PROCEDURE:  Procedure(s) (LRB) with comments: LAPAROSCOPY DIAGNOSTIC (N/A) LAPAROSCOPIC LYSIS OF ADHESIONS ()  SURGEON:  Surgeon(s) and Role:    Wilmon Arms. [name], MD - Primary    * Atilano Ina, MD,FACS - Assisting  PHYSICIAN ASSISTANT:   ASSISTANTSAndrey Campanile, MD    ANESTHESIA:   general  EBL:  Total I/O In: -  Out: 600 [Urine:600]  BLOOD ADMINISTERED:none  DRAINS: Previously placed percutaneous drain - right abdominal wall   LOCAL MEDICATIONS USED:  NONE  SPECIMEN:  No Specimen  DISPOSITION OF SPECIMEN:  N/A  COUNTS:  YES  TOURNIQUET:  * No tourniquets in log *  DICTATION: .Dragon Dictation  PLAN OF CARE: Return to floor  PATIENT DISPOSITION:  PACU - hemodynamically stable.   Delay start of Pharmacological VTE agent (>24hrs) due to surgical blood loss or risk of bleeding: no Wilmon Arms. [name], MD, [facility]  10/22/2011 1:09 PM

## 2011-10-22 NOTE — Transfer of Care (Signed)
Immediate Anesthesia Transfer of Care Note  Patient: [name]  Procedure(s) Performed: Procedure(s) (LRB) with comments: LAPAROSCOPY DIAGNOSTIC (N/A) LAPAROSCOPIC LYSIS OF ADHESIONS ()  Patient Location: PACU  Anesthesia Type: General  Level of Consciousness: awake, alert  and oriented  Airway & Oxygen Therapy: Patient Spontanous Breathing and Patient connected to face mask oxygen  Post-op Assessment: Report given to PACU RN and Post -op Vital signs reviewed and stable  Post vital signs: Reviewed and stable  Complications: No apparent anesthesia complications

## 2011-10-22 NOTE — Op Note (Signed)
Preop diagnosis: Subfascial fluid collection probable abscess Postop diagnosis: Subfascial seroma Procedure performed: Diagnostic laparoscopy with laparoscopic lysis of adhesions (60 minutes) Surgeon:Ocean Schildt K. Assistant: Dr. Gaynelle Adu Anesthesia: Gen. Endotracheal Indications:This is a 36 year old female who is morbidly obese with some other medical comorbidities who is status post open repair of a large ventral hernia performed 10/03/11.Her hernia was repaired with physio- mesh placed intraperitoneally with closure of the fascia anterior to the mesh. A drain had been placed in the subcutaneous space but was removed a week ago. The patient was admitted to the hospital late last week with fever and elevated white blood cell count as well as abdominal pain. She was empirically started on Invanz. A CT scan showed a sub fascial fluid collection that was percutaneously drained by interventional radiology.  The patient has not improve significantly so a repeat CT scan showed that there was another undrained fluid collection in the subfascial space. Her white count has improved somewhat but remains elevated. She presents now for diagnostic laparoscopy with possible exploratory laparotomy and resection of the mesh. My preoperative plan was to look with the laparoscope and if gross purulence was noted around the mesh that we would convert to an open procedure with excision of the mesh and repair of her abdominal wall with biological mesh.  Operative findings: There was dense adhesions of the omentum to the anterior abdominal wall in the area of the mesh. Once these were taken down clear yellow seroma fluid was drained from around the mesh and through the mesh. No purulence was noted anywhere.  Description of procedure: The patient was brought to the operating room and placed in a supine position on the operating room table. After an adequate level of general anesthesia was obtained a Foley catheter was  placed under sterile technique. The patient's abdomen was prepped with chlor prep and draped in sterile fashion. A timeout was taken to ensure the proper patient and proper procedure. We made a 5 mm incision in the left subcostal region. A 5 mm Optiview trocar was used to cannulate the peritoneal cavity. We insufflated CO2 maintaining a maximum pressure of 15 mm mercury. The laparoscope was inserted and we noted a large amount of adhesions to the entire surface of the intraperitoneal hernia mesh. We could not visualize the right abdominal wall due to the adhesions. However the left lower quadrant seemed free of any adhesions.  A another 5 mm port was placed lower on the left side. We then used blunt dissection to take the omental adhesions down from the mesh. This was fairly meticulous and took about an hour. We could visualize the liver which appeared healthy. There was some clear yellow fluid that had collected in the preperitoneal space near the falciform ligament. This was bluntly opened and drained. No purulence was noted in this area. We were able to peel all the omental adhesions away from the mesh and could visualize all of the mesh. The mesh seem to be in good placement. Clear yellow seroma fluid was seen draining around the mesh and through the small interstices of the mesh. No purulence was noted. There was no sign of recurrent hernia. The edges of the mesh seemed to be well incorporated. We then made the decision to leave the mesh in place after our adhesiolysis.  We will consult infectious disease to determine whether the patient would benefit from a prolonged course of intravenous antibiotics. If her condition does not improve, then it is possible that we will need  to return to the operating room for excision of the mesh. However due to the lack of purulent drainage around the mesh, I think there is a good chance that the patient will be able to heal after the seroma has been adequately drained and  will be able to salvage the mesh. The port r as pneumoperitoneum was released. 4 Monocryl was used to close skin incisions. Dermabond was applied. A dry dressing was placed over her old drain site. The patient was extubated and brought to the recovery room in stable condition. All sponge, instrument, and needle counts are correct.  Wilmon Arms. [name], MD, [facility]  10/22/2011 1:48 PM

## 2011-10-22 NOTE — Preoperative (Signed)
Beta Blockers   Reason not to administer Beta Blockers:Not Applicable 

## 2011-10-22 NOTE — Anesthesia Preprocedure Evaluation (Addendum)
Anesthesia Evaluation  Patient identified by MRN, date of birth, ID band Patient awake    Reviewed: Allergy & Precautions, H&P , NPO status , Patient's Chart, lab work & pertinent test results  History of Anesthesia Complications (+) PONV  Airway Mallampati: III      Dental  (+) Poor Dentition,    Pulmonary shortness of breath, with exertion and lying, sleep apnea ,  breath sounds clear to auscultation        Cardiovascular hypertension, Pt. on medications + angina with exertion + dysrhythmias Rhythm:Regular Rate:Normal     Neuro/Psych  Headaches, Anxiety Depression Bipolar Disorder CVA, No Residual Symptoms    GI/Hepatic GERD-  Poorly Controlled and Medicated,  Endo/Other  diabetes, Well Controlled, Type 2, Insulin Dependent and Oral Hypoglycemic AgentsHypothyroidism   Renal/GU Renal InsufficiencyRenal disease     Musculoskeletal   Abdominal (+) + obese,   Peds  Hematology   Anesthesia Other Findings   Reproductive/Obstetrics                          Anesthesia Physical Anesthesia Plan  ASA: III  Anesthesia Plan: General   Post-op Pain Management:    Induction: Intravenous  Airway Management Planned: Oral ETT  Additional Equipment:   Intra-op Plan:   Post-operative Plan: Extubation in OR  Informed Consent: I have reviewed the patients History and Physical, chart, labs and discussed the procedure including the risks, benefits and alternatives for the proposed anesthesia with the patient or authorized representative who has indicated his/her understanding and acceptance.   Dental advisory given  Plan Discussed with: CRNA and Surgeon  Anesthesia Plan Comments: (Abscess Abdomen S/P ventral hernia Re(pair 10/03/11 Type 2 DM glucose 108 Htn Bipolar Morbid obesity  Plan GA with RSI  Kipp Brood, MD)        Anesthesia Quick Evaluation

## 2011-10-22 NOTE — Progress Notes (Signed)
ANTIBIOTIC CONSULT NOTE - Follow-up  Pharmacy Consult:  Vancomycin to Daptomycin Indication:  S. aureus infection (abdominal abscess drainage)  Allergies  Allergen Reactions  . Cephalexin Anaphylaxis    But can tolerate Ceftin and Amoxicillin per patient  . Latex Itching  . Lisinopril Cough  . Statins Other (See Comments)    Back pain   . Tea Rash    Patient Measurements: Height: 5\' 5"  (165.1 cm) Weight: 332 lb (150.594 kg) IBW/kg (Calculated) : 57   Labs:  Basename 10/22/11 0500 10/21/11 0430 10/20/11 1005  WBC 14.8* 21.6* 19.4*  HGB 8.8* 9.2* 10.2*  PLT 238 260 247  LABCREA -- -- --  CREATININE 1.16* 1.34* 1.40*   Estimated Creatinine Clearance: 99 ml/min (by C-G formula based on Cr of 1.16). No results found for this basename: VANCOTROUGH:2,VANCOPEAK:2,VANCORANDOM:2,GENTTROUGH:2,GENTPEAK:2,GENTRANDOM:2,TOBRATROUGH:2,TOBRAPEAK:2,TOBRARND:2,AMIKACINPEAK:2,AMIKACINTROU:2,AMIKACIN:2, in the last 72 hours      Assessment: 36 YOF with abdominal abscess s/p drainage.  Culture growing S.aureus (preliminary) and pharmacy asked to dose and monitor vancomycin therapy.  Patient was on Invanz and continued to have fevers and elevated WBC.  Concern that it will be MRSA d/t his history of MRSA infection.  Patient's renal function is starting to improve.  Invanz 9/27 >> 9/29 Acyclovir continued from home med Vanc 9/29 >>9/30 Daptomycin 9/30>>  Goal of Therapy:  Appropriate dosing  Plan:  -Daptomycin 900 mg IV Q 24 hours -Follow up CK levels weekly -Follow cultures, progress  Okey Regal, PharmD [phone]  10/22/2011, 5:29 PM

## 2011-10-22 NOTE — Consult Note (Signed)
INFECTIOUS DISEASE CONSULT NOTE  Date of Admission:  10/19/2011  Date of Consult:  10/22/2011  Reason for Consult: Infected Mesh Referring Physician: Dr [name]  Impression/Recommendation Infected Mesh Intra-abdominal abscess (vs seroma)  Would change her anbx to cubicin and rifampin Check LFTs Check CK  Comment- would like to give her ancef but her allergy hx is somewhat concerning. I have given her precautions about taking rifampin (red discoloration of tears, saliva, urine). wuill need weekly CK monitoring on cubicin.   Thank you so much for this interesting consult,   Johny Sax [phone]  [name] is an 36 y.o. female.  HPI: 36 yo F with hx of obesity, DM, and ventral hernia repair 10-03-11. She returned to office on 9-24 and had drain removed and was noted to have serous drainage at that time. By 9-26, she began to have fevers, up to 102. On 9-27 she was seen in f/u and was noted to have purulent d/c from her wound/drain. She was found on CT (9-26): There is a walled collection midline deep anterior abdominal wall between the rectus muscles and below the fascia. This  collection ... Measures 14.7 cm cranial caudally and 4 cm in AP dimensional. Some air is noted within the subcutaneous  collection. These are highly suspicious for abscesses. There was also a second fluid collection in the L anterior abd wall.   She was also noted to have a WBC count of 22k. She was admitted to the hospital on 9-27 (started on Invanz) and had a drain placed by IR.  Due to continued pain, she underwent CT showing deeper fluid collection not being drained by catheter as well as fluid tracking towards her liver. She underwent laparoscopy on 9-30 and was found to have clear yellow seroma fluid and no purulence.    Past Medical History  Diagnosis Date  . Diabetes mellitus   . Migraine   . Hypoactive thyroid   . Cancer     cervical  . Osteoporosis   . Hypertension   . High  cholesterol   . Anginal pain 2011    "stress per Dr Pang"-occ at present from increased stress  . Dysrhythmia     hx MVP  . GERD (gastroesophageal reflux disease)   . Chronic kidney disease     stones in past,  hematuria daily- states hasnt been evaluated due to lack of insurance  . Psoriasis     scattered throughout body- large amount- states increased with stress of surgery  . Fatty liver     with gall bladder sludge by pt history/ no stones  . Stroke     ? subacranoid bleed-  no defits  . Mental disorder     bipolar  . Anxiety   . Sleep apnea      STOP BANG SCORE 6  . Ventral hernia 09/13/2011  . Depression   . Shortness of breath     with exertion/   ? adult onset asthma  . Pneumonia     hx of   . Arthritis   . Anemia     hx of anemia during pregnancy   . PONV (postoperative nausea and vomiting)     also hard to get asleep per pt   . Bipolar disorder     Past Surgical History  Procedure Date  . Cesarean section   . Appendectomy   . Lipoma     total 3  . Neuroplasty / transposition median nerve at carpal tunnel bilateral   .  Cervical cancer   . Kidney stone surgery   . Ventral hernia repair 10/03/2011    Procedure: HERNIA REPAIR VENTRAL ADULT;  Surgeon: Wilmon Arms. [name], MD;  Location: [facility];  Service: General;  Laterality: N/A;  open ventral hernia repair with mesh     Allergies  Allergen Reactions  . Cephalexin Anaphylaxis    But can tolerate Ceftin and Amoxicillin per patient  . Latex Itching  . Lisinopril Cough  . Statins Other (See Comments)    Back pain   . Tea Rash    Medications:  Scheduled:   . acyclovir  400 mg Oral BID  . atenolol  50 mg Oral QHS  . cycloSPORINE  200 mg Oral BID  . divalproex  500 mg Oral BID  . enoxaparin  40 mg Subcutaneous Q24H  . insulin aspart  0-20 Units Subcutaneous TID WC  . insulin aspart  0-5 Units Subcutaneous QHS  . levothyroxine  112 mcg Oral QAC breakfast  . ondansetron      . pantoprazole (PROTONIX) IV   40 mg Intravenous QHS  . vancomycin  1,500 mg Intravenous Q12H    Total days of antibiotics 3          Social History:  reports that she has never smoked. She has never used smokeless tobacco. She reports that she does not drink alcohol or use illicit drugs.  Family History  Problem Relation Age of Onset  . Hypertension Mother   . Glaucoma Mother   . Hypertension Father   . Cancer Father     precancerious polyps  . Cancer Maternal Uncle     colon    General ROS: no BM yet, has foley in, dry mouth, no further fever, cont abd pain. See HPI.  Blood pressure 115/54, pulse 86, temperature 98.2 F (36.8 C), temperature source Oral, resp. rate 18, height 5\' 5"  (1.651 m), weight 150.594 kg (332 lb), last menstrual period 07/22/2011, SpO2 97.00%. General appearance: alert, cooperative and mild distress Eyes: negative findings: conjunctivae and sclerae normal and pupils equal, round, reactive to light and accomodation Throat: abnormal findings: dry Neck: no adenopathy Lungs: clear to auscultation bilaterally Heart: irregularly irregular rhythm Abdomen: normal findings: bowel sounds normal and soft, non-tender and abnormal findings:  distended, obese and wounds clean, drain in RMQ. Extremities: edema none and no diabetic foot lesions   Results for orders placed during the hospital encounter of 10/19/11 (from the past 48 hour(s))  GLUCOSE, CAPILLARY     Status: Abnormal   Collection Time   10/20/11  5:20 PM      Component Value Range Comment   Glucose-Capillary 143 (*) 70 - 99 mg/dL    Comment 1 Notify RN     GLUCOSE, CAPILLARY     Status: Abnormal   Collection Time   10/20/11 10:04 PM      Component Value Range Comment   Glucose-Capillary 136 (*) 70 - 99 mg/dL    Comment 1 Notify RN     BASIC METABOLIC PANEL     Status: Abnormal   Collection Time   10/21/11  4:30 AM      Component Value Range Comment   Sodium 133 (*) 135 - 145 mEq/L    Potassium 4.5  3.5 - 5.1 mEq/L    Chloride  97  96 - 112 mEq/L    CO2 25  19 - 32 mEq/L    Glucose, Bld 122 (*) 70 - 99 mg/dL    BUN 27 (*)  6 - 23 mg/dL    Creatinine, Ser 1.61 (*) 0.50 - 1.10 mg/dL    Calcium 8.4  8.4 - 09.6 mg/dL    GFR calc non Af Amer 50 (*) >90 mL/min    GFR calc Af Amer 58 (*) >90 mL/min   CBC     Status: Abnormal   Collection Time   10/21/11  4:30 AM      Component Value Range Comment   WBC 21.6 (*) 4.0 - 10.5 K/uL    RBC 3.11 (*) 3.87 - 5.11 MIL/uL    Hemoglobin 9.2 (*) 12.0 - 15.0 g/dL    HCT 04.5 (*) 40.9 - 46.0 %    MCV 93.9  78.0 - 100.0 fL    MCH 29.6  26.0 - 34.0 pg    MCHC 31.5  30.0 - 36.0 g/dL    RDW 81.1 (*) 91.4 - 15.5 %    Platelets 260  150 - 400 K/uL   GLUCOSE, CAPILLARY     Status: Abnormal   Collection Time   10/21/11  7:43 AM      Component Value Range Comment   Glucose-Capillary 116 (*) 70 - 99 mg/dL    Comment 1 Notify RN     GLUCOSE, CAPILLARY     Status: Abnormal   Collection Time   10/21/11 12:32 PM      Component Value Range Comment   Glucose-Capillary 111 (*) 70 - 99 mg/dL    Comment 1 Notify RN     GLUCOSE, CAPILLARY     Status: Abnormal   Collection Time   10/21/11  5:27 PM      Component Value Range Comment   Glucose-Capillary 100 (*) 70 - 99 mg/dL    Comment 1 Notify RN     GLUCOSE, CAPILLARY     Status: Normal   Collection Time   10/21/11 10:13 PM      Component Value Range Comment   Glucose-Capillary 87  70 - 99 mg/dL   BASIC METABOLIC PANEL     Status: Abnormal   Collection Time   10/22/11  5:00 AM      Component Value Range Comment   Sodium 133 (*) 135 - 145 mEq/L    Potassium 4.4  3.5 - 5.1 mEq/L    Chloride 98  96 - 112 mEq/L    CO2 26  19 - 32 mEq/L    Glucose, Bld 102 (*) 70 - 99 mg/dL    BUN 25 (*) 6 - 23 mg/dL    Creatinine, Ser 7.82 (*) 0.50 - 1.10 mg/dL    Calcium 8.4  8.4 - 95.6 mg/dL    GFR calc non Af Amer 59 (*) >90 mL/min    GFR calc Af Amer 69 (*) >90 mL/min   CBC     Status: Abnormal   Collection Time   10/22/11  5:00 AM      Component  Value Range Comment   WBC 14.8 (*) 4.0 - 10.5 K/uL    RBC 3.02 (*) 3.87 - 5.11 MIL/uL    Hemoglobin 8.8 (*) 12.0 - 15.0 g/dL    HCT 21.3 (*) 08.6 - 46.0 %    MCV 93.7  78.0 - 100.0 fL    MCH 29.1  26.0 - 34.0 pg    MCHC 31.1  30.0 - 36.0 g/dL    RDW 57.8 (*) 46.9 - 15.5 %    Platelets 238  150 - 400 K/uL   GLUCOSE, CAPILLARY  Status: Abnormal   Collection Time   10/22/11  7:52 AM      Component Value Range Comment   Glucose-Capillary 108 (*) 70 - 99 mg/dL    Comment 1 Notify RN     MRSA PCR SCREENING     Status: Normal   Collection Time   10/22/11  8:56 AM      Component Value Range Comment   MRSA by PCR NEGATIVE  NEGATIVE   GLUCOSE, CAPILLARY     Status: Normal   Collection Time   10/22/11  1:19 PM      Component Value Range Comment   Glucose-Capillary 86  70 - 99 mg/dL    Comment 1 Notify RN         Component Value Date/Time   SDES ABSCESS ABDOMEN DRAINAGE 10/19/2011 1632   SPECREQUEST Normal 10/19/2011 1632   CULT  Value: ABUNDANT STAPHYLOCOCCUS AUREUS Note: RIFAMPIN AND GENTAMICIN SHOULD NOT BE USED AS SINGLE DRUGS FOR TREATMENT OF STAPH INFECTIONS. 10/19/2011 1632   REPTSTATUS 10/22/2011 FINAL 10/19/2011 1632   Ct Abdomen Pelvis W Contrast  10/21/2011  *RADIOLOGY REPORT*  Clinical Data: Status post abdominal drain.  Persistent moderate amount of pain incision site.  Drainage from tube.  Elevated white blood cell count.  Febrile.  Evaluate for new collections.  History of cervical cancer.  Diabetes.  Gastroparesis.  CT ABDOMEN AND PELVIS WITH CONTRAST  Technique:  Multidetector CT imaging of the abdomen and pelvis was performed following the standard protocol during bolus administration of intravenous contrast.  Contrast:  100  ml Omnipaque-300  Comparison: 10/18/2011 and drainage study of 10/18/2011.  Findings: Mild to moderate degradation secondary patient body habitus.  Clear lung bases.  Mild cardiomegaly.  Severe hepatic steatosis and hepatomegaly. No focal liver lesions.   Normal spleen, stomach, pancreas.  Gallbladder is mildly distended, without stone or specific evidence of acute cholecystitis.  No biliary ductal dilatation.  Normal adrenal glands.  Lower pole left renal calculus.  Normal right kidney.  Small retroperitoneal nodes which are not pathologic by size criteria.  There is prominent porta hepatis node which is likely reactive.  This measures 1.7 cm on image 32.  Normal colon and terminal ileum.  Appendix likely identified on image 71.  Normal small bowel without abdominal ascites.  No pelvic adenopathy.    Normal urinary bladder and uterus.  No adnexal mass.  Edema within the anterior pannus, including on image 88 which is not significantly changed.  Percutaneous drain has been placed in the complex anterior abdominal wall fluid and gas collection.  A small amount of residual edema and gas identified inferior to the catheter on image 64.  Small amount of ill-defined fluid is identified superior to the drain on image 52.  Deep to the drain and rectus musculature is a fluid collection which measures 7.0 x 4.0 cm on image 55.  This portion measures 6.3 x 3.2 cm on the prior exam.  In addition, extending deep from the superficial portion of the wall collection is an ill-defined intraperitoneal collection which tracks to the central left lobe of the liver and measures 3.6 x 5.0 cm on image 31.  There is diffuse anterior abdominal wall edema which is increased.  No acute osseous abnormality.  IMPRESSION:  1.  Mild to moderately degraded exam secondary patient body habitus. 2.  Percutaneous drain placement within a complex anterior abdominal wall collection.  The superficial portion of the collection is decreased.  The portion deep to the rectus  musculature is slightly increased in size, and may not be being drained by the percutaneous catheter. 3.  More cephalad fluid tracking to a collection in the porta hepatis region about the central aspect of the left lobe of the liver.   This finding was called to the patient's nurse, Kim, at 6:00 p.m. 4.  Gallbladder distention without specific evidence of acute cholecystitis.  This could be secondary to the adjacent fluid collection. 5.  Hepatic steatosis and hepatomegaly. 6.  Possible cellulitis and panniculitis about the anterior abdominal wall. 7. Nephrolithiasis.   Original Report Authenticated By: Consuello Bossier, M.D.    Recent Results (from the past 240 hour(s))  WOUND CULTURE     Status: Normal   Collection Time   10/18/11  2:47 PM      Component Value Range Status Comment   Culture Moderate STAPHYLOCOCCUS AUREUS   Final    GRAM STAIN Abundant   Final    GRAM STAIN WBC present-predominately PMN   Final    GRAM STAIN No Squamous Epithelial Cells Seen   Final    GRAM STAIN Moderate Gram Positive Cocci In Pairs In Clusters   Final    Organism ID, Bacteria STAPHYLOCOCCUS AUREUS   Final   CULTURE, ROUTINE-ABSCESS     Status: Normal   Collection Time   10/19/11  4:32 PM      Component Value Range Status Comment   Specimen Description ABSCESS ABDOMEN DRAINAGE   Final    Special Requests Normal   Final    Gram Stain     Final    Value: ABUNDANT WBC PRESENT, PREDOMINANTLY PMN     NO SQUAMOUS EPITHELIAL CELLS SEEN     FEW GRAM POSITIVE COCCI     IN PAIRS   Culture     Final    Value: ABUNDANT STAPHYLOCOCCUS AUREUS     Note: RIFAMPIN AND GENTAMICIN SHOULD NOT BE USED AS SINGLE DRUGS FOR TREATMENT OF STAPH INFECTIONS.   Report Status 10/22/2011 FINAL   Final    Organism ID, Bacteria STAPHYLOCOCCUS AUREUS   Final   MRSA PCR SCREENING     Status: Normal   Collection Time   10/22/11  8:56 AM      Component Value Range Status Comment   MRSA by PCR NEGATIVE  NEGATIVE Final       10/22/2011, 4:54 PM     LOS: 3 days

## 2011-10-22 NOTE — Anesthesia Postprocedure Evaluation (Signed)
  Anesthesia Post-op Note  Patient: [name]  Procedure(s) Performed: Procedure(s) (LRB) with comments: LAPAROSCOPY DIAGNOSTIC (N/A) LAPAROSCOPIC LYSIS OF ADHESIONS ()  Patient Location: PACU  Anesthesia Type: General  Level of Consciousness: awake, alert  and oriented  Airway and Oxygen Therapy: Patient Spontanous Breathing  Post-op Pain: mild  Post-op Assessment: Post-op Vital signs reviewed and Patient's Cardiovascular Status Stable  Post-op Vital Signs: stable  Complications: No apparent anesthesia complications

## 2011-10-22 NOTE — Progress Notes (Signed)
Subjective: Patient feels worse - some RUQ pain, some LLQ pain Febrile CT scan concerning for enlarging undrained abscess with extension towards the liver  Objective: Vital signs in last 24 hours: Temp:  [98.6 F (37 C)-99.7 F (37.6 C)] 99 F (37.2 C) (09/30 0627) Pulse Rate:  [77-85] 82  (09/30 0627) Resp:  [18-22] 18  (09/30 0627) BP: (117-132)/(69-80) 130/76 mmHg (09/30 0627) SpO2:  [90 %-97 %] 91 % (09/30 0627) Last BM Date: 10/19/11  Intake/Output from previous day: 09/29 0701 - 09/30 0700 In: 3522 [P.O.:730; I.V.:2792] Out: 1810 [Urine:1700; Drains:110] Intake/Output this shift:    General appearance: alert, cooperative and no distress Abd - mild panniculitis below the previous incision Drain - yellowish output Incision c/d/i Moderately tender to palpation  Lab Results:   Basename 10/22/11 0500 10/21/11 0430  WBC 14.8* 21.6*  HGB 8.8* 9.2*  HCT 28.3* 29.2*  PLT 238 260   BMET  Basename 10/21/11 0430 10/20/11 1005  NA 133* 134*  K 4.5 4.3  CL 97 99  CO2 25 25  GLUCOSE 122* 148*  BUN 27* 23  CREATININE 1.34* 1.40*  CALCIUM 8.4 8.5   PT/INR  Basename 10/19/11 1318  LABPROT 16.7*  INR 1.39   ABG No results found for this basename: PHART:2,PCO2:2,PO2:2,HCO3:2 in the last 72 hours  Studies/Results: Ct Abdomen Pelvis W Contrast  10/21/2011  *RADIOLOGY REPORT*  Clinical Data: Status post abdominal drain.  Persistent moderate amount of pain incision site.  Drainage from tube.  Elevated white blood cell count.  Febrile.  Evaluate for new collections.  History of cervical cancer.  Diabetes.  Gastroparesis.  CT ABDOMEN AND PELVIS WITH CONTRAST  Technique:  Multidetector CT imaging of the abdomen and pelvis was performed following the standard protocol during bolus administration of intravenous contrast.  Contrast:  100  ml Omnipaque-300  Comparison: 10/18/2011 and drainage study of 10/18/2011.  Findings: Mild to moderate degradation secondary patient body  habitus.  Clear lung bases.  Mild cardiomegaly.  Severe hepatic steatosis and hepatomegaly. No focal liver lesions.  Normal spleen, stomach, pancreas.  Gallbladder is mildly distended, without stone or specific evidence of acute cholecystitis.  No biliary ductal dilatation.  Normal adrenal glands.  Lower pole left renal calculus.  Normal right kidney.  Small retroperitoneal nodes which are not pathologic by size criteria.  There is prominent porta hepatis node which is likely reactive.  This measures 1.7 cm on image 32.  Normal colon and terminal ileum.  Appendix likely identified on image 71.  Normal small bowel without abdominal ascites.  No pelvic adenopathy.    Normal urinary bladder and uterus.  No adnexal mass.  Edema within the anterior pannus, including on image 88 which is not significantly changed.  Percutaneous drain has been placed in the complex anterior abdominal wall fluid and gas collection.  A small amount of residual edema and gas identified inferior to the catheter on image 64.  Small amount of ill-defined fluid is identified superior to the drain on image 52.  Deep to the drain and rectus musculature is a fluid collection which measures 7.0 x 4.0 cm on image 55.  This portion measures 6.3 x 3.2 cm on the prior exam.  In addition, extending deep from the superficial portion of the wall collection is an ill-defined intraperitoneal collection which tracks to the central left lobe of the liver and measures 3.6 x 5.0 cm on image 31.  There is diffuse anterior abdominal wall edema which is increased.  No  acute osseous abnormality.  IMPRESSION:  1.  Mild to moderately degraded exam secondary patient body habitus. 2.  Percutaneous drain placement within a complex anterior abdominal wall collection.  The superficial portion of the collection is decreased.  The portion deep to the rectus musculature is slightly increased in size, and may not be being drained by the percutaneous catheter. 3.  More cephalad  fluid tracking to a collection in the porta hepatis region about the central aspect of the left lobe of the liver.  This finding was called to the patient's nurse, Kim, at 6:00 p.m. 4.  Gallbladder distention without specific evidence of acute cholecystitis.  This could be secondary to the adjacent fluid collection. 5.  Hepatic steatosis and hepatomegaly. 6.  Possible cellulitis and panniculitis about the anterior abdominal wall. 7. Nephrolithiasis.   Original Report Authenticated By: Consuello Bossier, M.D.     Anti-infectives: Anti-infectives     Start     Dose/Rate Route Frequency Ordered Stop   10/21/11 2200   vancomycin (VANCOCIN) 1,500 mg in sodium chloride 0.9 % 500 mL IVPB        1,500 mg 250 mL/hr over 120 Minutes Intravenous Every 12 hours 10/21/11 0945     10/21/11 1000   vancomycin (VANCOCIN) 2,500 mg in sodium chloride 0.9 % 500 mL IVPB        2,500 mg 250 mL/hr over 120 Minutes Intravenous NOW 10/21/11 0945 10/21/11 1337   10/19/11 1400   acyclovir (ZOVIRAX) 200 MG capsule 400 mg        400 mg Oral 2 times daily 10/19/11 1122     10/19/11 1200   ertapenem (INVANZ) 1 g in sodium chloride 0.9 % 50 mL IVPB  Status:  Discontinued        1 g 100 mL/hr over 30 Minutes Intravenous Every 24 hours 10/19/11 1017 10/21/11 0944          Assessment/Plan: s/p * No surgery found * Due to the enlargement of the intra-abdominal abscess and the extension towards the liver, I think that she has a significant mesh infection.  I recommend laparoscopy to determine the extent of involvement with likely exploratory laparotomy/ excision of mesh/ primary closure of fascia, possibly with biologic mesh. The surgical procedure has been discussed with the patient.  Potential risks, benefits, alternative treatments, and expected outcomes have been explained.  All of the patient's questions at this time have been answered.  The likelihood of reaching the patient's treatment goal is good.  The patient  understand the proposed surgical procedure and wishes to proceed.   LOS: 3 days    Estel Tonelli K. 10/22/2011

## 2011-10-23 ENCOUNTER — Encounter (HOSPITAL_COMMUNITY): Payer: Self-pay | Admitting: Surgery

## 2011-10-23 DIAGNOSIS — L03319 Cellulitis of trunk, unspecified: Secondary | ICD-10-CM

## 2011-10-23 DIAGNOSIS — L02219 Cutaneous abscess of trunk, unspecified: Secondary | ICD-10-CM

## 2011-10-23 LAB — GLUCOSE, CAPILLARY
Glucose-Capillary: 111 mg/dL — ABNORMAL HIGH (ref 70–99)
Glucose-Capillary: 163 mg/dL — ABNORMAL HIGH (ref 70–99)
Glucose-Capillary: 122 mg/dL — ABNORMAL HIGH (ref 70–99)
Glucose-Capillary: 119 mg/dL — ABNORMAL HIGH (ref 70–99)

## 2011-10-23 MED ORDER — POLYETHYLENE GLYCOL 3350 17 G PO PACK
17.0000 g | PACK | Freq: Once | ORAL | Status: AC
  Administered 2011-10-23: 17 g via ORAL
  Filled 2011-10-23: qty 1

## 2011-10-23 MED ORDER — AMOXICILLIN-POT CLAVULANATE 875-125 MG PO TABS
1.0000 | ORAL_TABLET | Freq: Two times a day (BID) | ORAL | Status: DC
  Administered 2011-10-23 (×2): 1 via ORAL
  Filled 2011-10-23 (×3): qty 1

## 2011-10-23 MED ORDER — SODIUM CHLORIDE 0.9 % IV SOLN
INTRAVENOUS | Status: DC
  Administered 2011-10-23 – 2011-10-28 (×7): via INTRAVENOUS

## 2011-10-23 MED ORDER — PANTOPRAZOLE SODIUM 40 MG PO TBEC
40.0000 mg | DELAYED_RELEASE_TABLET | Freq: Every day | ORAL | Status: DC
  Administered 2011-10-23 – 2011-10-28 (×6): 40 mg via ORAL
  Filled 2011-10-23 (×6): qty 1

## 2011-10-23 MED ORDER — SIMETHICONE 80 MG PO CHEW
80.0000 mg | CHEWABLE_TABLET | Freq: Four times a day (QID) | ORAL | Status: DC | PRN
  Administered 2011-10-23: 80 mg via ORAL
  Filled 2011-10-23 (×2): qty 1

## 2011-10-23 MED ORDER — DOCUSATE SODIUM 100 MG PO CAPS
100.0000 mg | ORAL_CAPSULE | Freq: Two times a day (BID) | ORAL | Status: DC
  Administered 2011-10-23 – 2011-10-29 (×7): 100 mg via ORAL
  Filled 2011-10-23 (×8): qty 1

## 2011-10-23 MED ORDER — DIPHENHYDRAMINE HCL 25 MG PO CAPS
25.0000 mg | ORAL_CAPSULE | Freq: Once | ORAL | Status: AC
  Administered 2011-10-23: 25 mg via ORAL
  Filled 2011-10-23: qty 1

## 2011-10-23 NOTE — Progress Notes (Signed)
Notified [facility] physician on-call of pt having hives and swollen upper eye lids. Relayed that I had spoken to pharmacy and they suspected the Augmentin. Received order for benadryl and to call [facility] physician on call. Spoke with Dr. Luciana Axe from [facility] who gave verbal order to discontinue Augmentin.

## 2011-10-23 NOTE — Progress Notes (Signed)
[facility]  Date of Admission:  10/19/2011  Antibiotics: Acyclovir - chronic Daptomycin - 9/30- 10/1 Rifampin - 9/30-10/1  Subjective: Pain but no fever, chills  Objective: Temp:  [98.1 F (36.7 C)-100 F (37.8 C)] 99.4 F (37.4 C) (10/01 0821) Pulse Rate:  [83-96] 90  (10/01 0821) Resp:  [18-26] 20  (10/01 0821) BP: (113-145)/(39-78) 113/62 mmHg (10/01 0821) SpO2:  [93 %-99 %] 98 % (10/01 0821)  General: AAO x 3, nad Lungs: CTA Cor: RRR Abdomen: incision noted, no significant tenderness, no significant erythema, soft   Lab Results Lab Results  Component Value Date   WBC 14.8* 10/22/2011   HGB 8.8* 10/22/2011   HCT 28.3* 10/22/2011   MCV 93.7 10/22/2011   PLT 238 10/22/2011    Lab Results  Component Value Date   CREATININE 1.07 10/22/2011   BUN 22 10/22/2011   NA 134* 10/22/2011   K 5.2* 10/22/2011   CL 100 10/22/2011   CO2 25 10/22/2011    Lab Results  Component Value Date   ALT 11 10/22/2011   AST 23 10/22/2011   ALKPHOS 94 10/22/2011   BILITOT 1.4* 10/22/2011      Microbiology: Recent Results (from the past 240 hour(s))  WOUND CULTURE     Status: Normal   Collection Time   10/18/11  2:47 PM      Component Value Range Status Comment   Culture Moderate STAPHYLOCOCCUS AUREUS   Final    GRAM STAIN Abundant   Final    GRAM STAIN WBC present-predominately PMN   Final    GRAM STAIN No Squamous Epithelial Cells Seen   Final    GRAM STAIN Moderate Gram Positive Cocci In Pairs In Clusters   Final    Organism ID, Bacteria STAPHYLOCOCCUS AUREUS   Final   CULTURE, ROUTINE-ABSCESS     Status: Normal   Collection Time   10/19/11  4:32 PM      Component Value Range Status Comment   Specimen Description ABSCESS ABDOMEN DRAINAGE   Final    Special Requests Normal   Final    Gram Stain     Final    Value: ABUNDANT WBC PRESENT, PREDOMINANTLY PMN     NO SQUAMOUS EPITHELIAL CELLS SEEN     FEW GRAM POSITIVE COCCI     IN PAIRS   Culture     Final     Value: ABUNDANT STAPHYLOCOCCUS AUREUS     Note: RIFAMPIN AND GENTAMICIN SHOULD NOT BE USED AS SINGLE DRUGS FOR TREATMENT OF STAPH INFECTIONS.   Report Status 10/22/2011 FINAL   Final    Organism ID, Bacteria STAPHYLOCOCCUS AUREUS   Final   MRSA PCR SCREENING     Status: Normal   Collection Time   10/22/11  8:56 AM      Component Value Range Status Comment   MRSA by PCR NEGATIVE  NEGATIVE Final     Studies/Results: Ct Abdomen Pelvis W Contrast  10/21/2011  *RADIOLOGY REPORT*  Clinical Data: Status post abdominal drain.  Persistent moderate amount of pain incision site.  Drainage from tube.  Elevated white blood cell count.  Febrile.  Evaluate for new collections.  History of cervical cancer.  Diabetes.  Gastroparesis.  CT ABDOMEN AND PELVIS WITH CONTRAST  Technique:  Multidetector CT imaging of the abdomen and pelvis was performed following the standard protocol during bolus administration of intravenous contrast.  Contrast:  100  ml Omnipaque-300  Comparison: 10/18/2011 and drainage study of  10/18/2011.  Findings: Mild to moderate degradation secondary patient body habitus.  Clear lung bases.  Mild cardiomegaly.  Severe hepatic steatosis and hepatomegaly. No focal liver lesions.  Normal spleen, stomach, pancreas.  Gallbladder is mildly distended, without stone or specific evidence of acute cholecystitis.  No biliary ductal dilatation.  Normal adrenal glands.  Lower pole left renal calculus.  Normal right kidney.  Small retroperitoneal nodes which are not pathologic by size criteria.  There is prominent porta hepatis node which is likely reactive.  This measures 1.7 cm on image 32.  Normal colon and terminal ileum.  Appendix likely identified on image 71.  Normal small bowel without abdominal ascites.  No pelvic adenopathy.    Normal urinary bladder and uterus.  No adnexal mass.  Edema within the anterior pannus, including on image 88 which is not significantly changed.  Percutaneous drain has been  placed in the complex anterior abdominal wall fluid and gas collection.  A small amount of residual edema and gas identified inferior to the catheter on image 64.  Small amount of ill-defined fluid is identified superior to the drain on image 52.  Deep to the drain and rectus musculature is a fluid collection which measures 7.0 x 4.0 cm on image 55.  This portion measures 6.3 x 3.2 cm on the prior exam.  In addition, extending deep from the superficial portion of the wall collection is an ill-defined intraperitoneal collection which tracks to the central left lobe of the liver and measures 3.6 x 5.0 cm on image 31.  There is diffuse anterior abdominal wall edema which is increased.  No acute osseous abnormality.  IMPRESSION:  1.  Mild to moderately degraded exam secondary patient body habitus. 2.  Percutaneous drain placement within a complex anterior abdominal wall collection.  The superficial portion of the collection is decreased.  The portion deep to the rectus musculature is slightly increased in size, and may not be being drained by the percutaneous catheter. 3.  More cephalad fluid tracking to a collection in the porta hepatis region about the central aspect of the left lobe of the liver.  This finding was called to the patient's nurse, Kim, at 6:00 p.m. 4.  Gallbladder distention without specific evidence of acute cholecystitis.  This could be secondary to the adjacent fluid collection. 5.  Hepatic steatosis and hepatomegaly. 6.  Possible cellulitis and panniculitis about the anterior abdominal wall. 7. Nephrolithiasis.   Original Report Authenticated By: Consuello Bossier, M.D.     Assessment/Plan  1) seroma - no signficant purulence in OR, it did grow MSSA.  I would opt to treat this as possibly infected.  I do not feel IV antibiotics are required for a seroma and I will change to po Augmentin.  She has tolerated amoxicillin previously without allergic reaction.  She should take this (Augmentin) for 2-3  weeks and have clinical follow up.  I would not use rifampin due to the cyclosporin, levels will be more difficult to manage.     Staci Righter, MD [facility] [phone] pager   10/23/2011, 11:17 AM

## 2011-10-23 NOTE — Progress Notes (Signed)
Notified physician of pt complaint of body aches. No new orders received.

## 2011-10-23 NOTE — Progress Notes (Signed)
1 Day Post-Op  Subjective:  Sore at lower part of pannus; tolerating PO's Good UOP via Foley Appreciate ID consult - on Cubicin and Rifampin (? Length of treatment; ? Will patient need PICC line and home Cubicin)  Objective: Vital signs in last 24 hours: Temp:  [98.1 F (36.7 C)-100 F (37.8 C)] 99.4 F (37.4 C) (10/01 0821) Pulse Rate:  [83-96] 90  (10/01 0821) Resp:  [18-26] 20  (10/01 0821) BP: (113-145)/(39-78) 113/62 mmHg (10/01 0821) SpO2:  [93 %-99 %] 98 % (10/01 0821) Last BM Date: 10/13/11  Intake/Output from previous day: 09/30 0701 - 10/01 0700 In: 2258 [I.V.:2253] Out: 1795 [Urine:1725; Drains:45; Blood:25] Intake/Output this shift:    General appearance: sitting in chair; uncomfortable GI: massively obese; minimal panniculitis at lower part of abdomen Laparoscopic incisions c/d/i Necrotic fat in drainage tube Lab Results:   Basename 10/22/11 0500 10/21/11 0430  WBC 14.8* 21.6*  HGB 8.8* 9.2*  HCT 28.3* 29.2*  PLT 238 260   BMET  Basename 10/22/11 1825 10/22/11 0500  NA 134* 133*  K 5.2* 4.4  CL 100 98  CO2 25 26  GLUCOSE 108* 102*  BUN 22 25*  CREATININE 1.07 1.16*  CALCIUM 8.4 8.4   PT/INR No results found for this basename: LABPROT:2,INR:2 in the last 72 hours ABG No results found for this basename: PHART:2,PCO2:2,PO2:2,HCO3:2 in the last 72 hours  Studies/Results: Ct Abdomen Pelvis W Contrast  10/21/2011  *RADIOLOGY REPORT*  Clinical Data: Status post abdominal drain.  Persistent moderate amount of pain incision site.  Drainage from tube.  Elevated white blood cell count.  Febrile.  Evaluate for new collections.  History of cervical cancer.  Diabetes.  Gastroparesis.  CT ABDOMEN AND PELVIS WITH CONTRAST  Technique:  Multidetector CT imaging of the abdomen and pelvis was performed following the standard protocol during bolus administration of intravenous contrast.  Contrast:  100  ml Omnipaque-300  Comparison: 10/18/2011 and drainage study of  10/18/2011.  Findings: Mild to moderate degradation secondary patient body habitus.  Clear lung bases.  Mild cardiomegaly.  Severe hepatic steatosis and hepatomegaly. No focal liver lesions.  Normal spleen, stomach, pancreas.  Gallbladder is mildly distended, without stone or specific evidence of acute cholecystitis.  No biliary ductal dilatation.  Normal adrenal glands.  Lower pole left renal calculus.  Normal right kidney.  Small retroperitoneal nodes which are not pathologic by size criteria.  There is prominent porta hepatis node which is likely reactive.  This measures 1.7 cm on image 32.  Normal colon and terminal ileum.  Appendix likely identified on image 71.  Normal small bowel without abdominal ascites.  No pelvic adenopathy.    Normal urinary bladder and uterus.  No adnexal mass.  Edema within the anterior pannus, including on image 88 which is not significantly changed.  Percutaneous drain has been placed in the complex anterior abdominal wall fluid and gas collection.  A small amount of residual edema and gas identified inferior to the catheter on image 64.  Small amount of ill-defined fluid is identified superior to the drain on image 52.  Deep to the drain and rectus musculature is a fluid collection which measures 7.0 x 4.0 cm on image 55.  This portion measures 6.3 x 3.2 cm on the prior exam.  In addition, extending deep from the superficial portion of the wall collection is an ill-defined intraperitoneal collection which tracks to the central left lobe of the liver and measures 3.6 x 5.0 cm on image 31.  There is diffuse anterior abdominal wall edema which is increased.  No acute osseous abnormality.  IMPRESSION:  1.  Mild to moderately degraded exam secondary patient body habitus. 2.  Percutaneous drain placement within a complex anterior abdominal wall collection.  The superficial portion of the collection is decreased.  The portion deep to the rectus musculature is slightly increased in size,  and may not be being drained by the percutaneous catheter. 3.  More cephalad fluid tracking to a collection in the porta hepatis region about the central aspect of the left lobe of the liver.  This finding was called to the patient's nurse, Kim, at 6:00 p.m. 4.  Gallbladder distention without specific evidence of acute cholecystitis.  This could be secondary to the adjacent fluid collection. 5.  Hepatic steatosis and hepatomegaly. 6.  Possible cellulitis and panniculitis about the anterior abdominal wall. 7. Nephrolithiasis.   Original Report Authenticated By: Consuello Bossier, M.D.     Anti-infectives: Anti-infectives     Start     Dose/Rate Route Frequency Ordered Stop   10/22/11 1930   DAPTOmycin (CUBICIN) 900 mg in sodium chloride 0.9 % IVPB        900 mg 236 mL/hr over 30 Minutes Intravenous Every 24 hours 10/22/11 1732     10/22/11 1730   rifampin (RIFADIN) capsule 300 mg        300 mg Oral Daily 10/22/11 1725     10/21/11 2200   vancomycin (VANCOCIN) 1,500 mg in sodium chloride 0.9 % 500 mL IVPB  Status:  Discontinued        1,500 mg 250 mL/hr over 120 Minutes Intravenous Every 12 hours 10/21/11 0945 10/22/11 1725   10/21/11 1000   vancomycin (VANCOCIN) 2,500 mg in sodium chloride 0.9 % 500 mL IVPB        2,500 mg 250 mL/hr over 120 Minutes Intravenous NOW 10/21/11 0945 10/21/11 1337   10/19/11 1400   acyclovir (ZOVIRAX) 200 MG capsule 400 mg        400 mg Oral 2 times daily 10/19/11 1122     10/19/11 1200   ertapenem (INVANZ) 1 g in sodium chloride 0.9 % 50 mL IVPB  Status:  Discontinued        1 g 100 mL/hr over 30 Minutes Intravenous Every 24 hours 10/19/11 1017 10/21/11 0944          Assessment/Plan: s/p Procedure(s) (LRB) with comments: LAPAROSCOPY DIAGNOSTIC (N/A) LAPAROSCOPIC LYSIS OF ADHESIONS () Recheck labs in AM Antibiotic regimen per Dr. Moshe Cipro recommendations Ambulate D/C foley   LOS: 4 days    Cole Eastridge K. 10/23/2011

## 2011-10-23 NOTE — Progress Notes (Signed)
Orthopedic Tech Progress Note Patient Details:  [name] 06/30/1975 [id number]  Ortho Devices Type of Ortho Device: Abdominal binder Ortho Device/Splint Location: 2 abdominal binders  velcroed together Ortho Device/Splint Interventions: Application   Cammer, Kyleigha Markert 10/23/2011, 1:01 PM

## 2011-10-24 LAB — GLUCOSE, CAPILLARY
Glucose-Capillary: 147 mg/dL — ABNORMAL HIGH (ref 70–99)
Glucose-Capillary: 148 mg/dL — ABNORMAL HIGH (ref 70–99)
Glucose-Capillary: 154 mg/dL — ABNORMAL HIGH (ref 70–99)
Glucose-Capillary: 172 mg/dL — ABNORMAL HIGH (ref 70–99)

## 2011-10-24 LAB — CBC
Hemoglobin: 10.7 g/dL — ABNORMAL LOW (ref 12.0–15.0)
RBC: 3.64 MIL/uL — ABNORMAL LOW (ref 3.87–5.11)

## 2011-10-24 LAB — BASIC METABOLIC PANEL
Chloride: 100 mEq/L (ref 96–112)
GFR calc Af Amer: >90 mL/min (ref >90)
Potassium: 4.8 mEq/L (ref 3.5–5.1)
Sodium: 133 mEq/L — ABNORMAL LOW (ref 135–145)

## 2011-10-24 MED ORDER — IBUPROFEN 600 MG PO TABS
600.0000 mg | ORAL_TABLET | Freq: Three times a day (TID) | ORAL | Status: DC | PRN
  Administered 2011-10-24 – 2011-10-28 (×3): 600 mg via ORAL
  Filled 2011-10-24 (×4): qty 1

## 2011-10-24 MED ORDER — MUPIROCIN 2 % EX OINT
TOPICAL_OINTMENT | Freq: Two times a day (BID) | CUTANEOUS | Status: DC
  Administered 2011-10-24 – 2011-10-29 (×11): via NASAL
  Filled 2011-10-24: qty 22

## 2011-10-24 MED ORDER — DIPHENHYDRAMINE HCL 25 MG PO CAPS
50.0000 mg | ORAL_CAPSULE | Freq: Once | ORAL | Status: AC
  Administered 2011-10-24: 50 mg via ORAL
  Filled 2011-10-24: qty 2

## 2011-10-24 MED ORDER — DOXYCYCLINE HYCLATE 100 MG PO TABS
100.0000 mg | ORAL_TABLET | Freq: Two times a day (BID) | ORAL | Status: DC
  Administered 2011-10-24: 100 mg via ORAL
  Filled 2011-10-24 (×2): qty 1

## 2011-10-24 MED ORDER — CHLORHEXIDINE GLUCONATE CLOTH 2 % EX PADS
6.0000 | MEDICATED_PAD | Freq: Every day | CUTANEOUS | Status: DC
  Administered 2011-10-25 – 2011-10-29 (×5): 6 via TOPICAL

## 2011-10-24 MED ORDER — DOXYCYCLINE HYCLATE 100 MG PO TABS
100.0000 mg | ORAL_TABLET | Freq: Two times a day (BID) | ORAL | Status: DC
  Administered 2011-10-25 – 2011-10-29 (×9): 100 mg via ORAL
  Filled 2011-10-24 (×11): qty 1

## 2011-10-24 NOTE — Progress Notes (Signed)
2 Days Post-Op  Subjective: Patient was switched from Rifampin, Cubicin to Augmentin yesterday - developed rash, itching, eyelid swelling.  Awaiting new recommendations from ID.  Doing better since having bowel movements and using abdominal binder  Low grade temp - 100.5 Drain - decreased output WBC remains elevated at 22K  Objective: Vital signs in last 24 hours: Temp:  [98.8 F (37.1 C)-100.5 F (38.1 C)] 99.6 F (37.6 C) (10/02 0500) Pulse Rate:  [87-99] 99  (10/02 0500) Resp:  [18-20] 18  (10/02 0500) BP: (121-152)/(59-87) 138/79 mmHg (10/02 0500) SpO2:  [93 %-98 %] 93 % (10/02 0500) Last BM Date: 10/19/11  Intake/Output from previous day: 10/01 0701 - 10/02 0700 In: 1607.4 [P.O.:240; I.V.:1347.4] Out: 1115 [Urine:1050; Drains:65] Intake/Output this shift:    Sitting in chair - NAD Eyelids - mild edema Rash over face Lungs - CTA B CV - RRR Abd - incision c/d/i; drain - serous output with some sediment Old drain site with some minimal purulent drainage   Lab Results:   Basename 10/24/11 0640 10/22/11 0500  WBC 22.0* 14.8*  HGB 10.7* 8.8*  HCT 33.4* 28.3*  PLT 248 238   BMET  Basename 10/24/11 0640 10/22/11 1825  NA 133* 134*  K 4.8 5.2*  CL 100 100  CO2 21 25  GLUCOSE 147* 108*  BUN 18 22  CREATININE 0.91 1.07  CALCIUM 8.0* 8.4   PT/INR No results found for this basename: LABPROT:2,INR:2 in the last 72 hours ABG No results found for this basename: PHART:2,PCO2:2,PO2:2,HCO3:2 in the last 72 hours  Studies/Results: No results found.  Anti-infectives: Anti-infectives     Start     Dose/Rate Route Frequency Ordered Stop   10/23/11 1200   amoxicillin-clavulanate (AUGMENTIN) 875-125 MG per tablet 1 tablet  Status:  Discontinued        1 tablet Oral 2 times daily with meals 10/23/11 1123 10/23/11 2052   10/22/11 1930   DAPTOmycin (CUBICIN) 900 mg in sodium chloride 0.9 % IVPB  Status:  Discontinued        900 mg 236 mL/hr over 30 Minutes  Intravenous Every 24 hours 10/22/11 1732 10/23/11 1123   10/22/11 1730   rifampin (RIFADIN) capsule 300 mg  Status:  Discontinued        300 mg Oral Daily 10/22/11 1725 10/23/11 1123   10/21/11 2200   vancomycin (VANCOCIN) 1,500 mg in sodium chloride 0.9 % 500 mL IVPB  Status:  Discontinued        1,500 mg 250 mL/hr over 120 Minutes Intravenous Every 12 hours 10/21/11 0945 10/22/11 1725   10/21/11 1000   vancomycin (VANCOCIN) 2,500 mg in sodium chloride 0.9 % 500 mL IVPB        2,500 mg 250 mL/hr over 120 Minutes Intravenous NOW 10/21/11 0945 10/21/11 1337   10/19/11 1400   acyclovir (ZOVIRAX) 200 MG capsule 400 mg        400 mg Oral 2 times daily 10/19/11 1122     10/19/11 1200   ertapenem (INVANZ) 1 g in sodium chloride 0.9 % 50 mL IVPB  Status:  Discontinued        1 g 100 mL/hr over 30 Minutes Intravenous Every 24 hours 10/19/11 1017 10/21/11 0944          Assessment/Plan: s/p Procedure(s) (LRB) with comments: LAPAROSCOPY DIAGNOSTIC (N/A) LAPAROSCOPIC LYSIS OF ADHESIONS () ID to select different antibiotic regimen as she does not seem to be able to tolerate Augmentin If she tolerates new antibiotic  and she is able to mobilize herself, then possibly home in the next day or two.   LOS: 5 days    Davine Coba K. 10/24/2011

## 2011-10-24 NOTE — Progress Notes (Signed)
Pt with questions r/t her PIV, her antibiotics, drain care at home and supplies.  IV Team is here to remove her IV that she suspects is blown due to increased pain. She understands needing to have IV access while here in hospital.  Read notes from ID to her and explained importance of taking whole course of antibiotics. She stated understanding. Discussed JP Drain emptying. She understands this process. Talked about dressing supplies and obtaining at [facility]. She didn't feel HH would be necessary.  We can revisit this when closer to d/c in case patient has changed her mind.

## 2011-10-24 NOTE — Progress Notes (Addendum)
    [facility]  Date of Admission:  10/19/2011  Antibiotics: daptomycin 9/30 Rifampin 9/30-10/1  Subjective: Had eyelid swelling, rash yesterday after 2 doses of Augmentin.    Objective: Temp:  [98.8 F (37.1 C)-100.5 F (38.1 C)] 99.6 F (37.6 C) (10/02 0500) Pulse Rate:  [87-99] 99  (10/02 0500) Resp:  [18-20] 18  (10/02 0500) BP: (121-152)/(59-87) 138/79 mmHg (10/02 0500) SpO2:  [93 %-98 %] 93 % (10/02 0500)  General: awake, sleepy, nad Skin: macular rash, some eyelid erythema   Lab Results Lab Results  Component Value Date   WBC 22.0* 10/24/2011   HGB 10.7* 10/24/2011   HCT 33.4* 10/24/2011   MCV 91.8 10/24/2011   PLT 248 10/24/2011    Lab Results  Component Value Date   CREATININE 0.91 10/24/2011   BUN 18 10/24/2011   NA 133* 10/24/2011   K 4.8 10/24/2011   CL 100 10/24/2011   CO2 21 10/24/2011    Lab Results  Component Value Date   ALT 11 10/22/2011   AST 23 10/22/2011   ALKPHOS 94 10/22/2011   BILITOT 1.4* 10/22/2011      Microbiology: Recent Results (from the past 240 hour(s))  WOUND CULTURE     Status: Normal   Collection Time   10/18/11  2:47 PM      Component Value Range Status Comment   Culture Moderate STAPHYLOCOCCUS AUREUS   Final    GRAM STAIN Abundant   Final    GRAM STAIN WBC present-predominately PMN   Final    GRAM STAIN No Squamous Epithelial Cells Seen   Final    GRAM STAIN Moderate Gram Positive Cocci In Pairs In Clusters   Final    Organism ID, Bacteria STAPHYLOCOCCUS AUREUS   Final   CULTURE, ROUTINE-ABSCESS     Status: Normal   Collection Time   10/19/11  4:32 PM      Component Value Range Status Comment   Specimen Description ABSCESS ABDOMEN DRAINAGE   Final    Special Requests Normal   Final    Gram Stain     Final    Value: ABUNDANT WBC PRESENT, PREDOMINANTLY PMN     NO SQUAMOUS EPITHELIAL CELLS SEEN     FEW GRAM POSITIVE COCCI     IN PAIRS   Culture     Final    Value: ABUNDANT STAPHYLOCOCCUS AUREUS   Note: RIFAMPIN AND GENTAMICIN SHOULD NOT BE USED AS SINGLE DRUGS FOR TREATMENT OF STAPH INFECTIONS.   Report Status 10/22/2011 FINAL   Final    Organism ID, Bacteria STAPHYLOCOCCUS AUREUS   Final   MRSA PCR SCREENING     Status: Normal   Collection Time   10/22/11  8:56 AM      Component Value Range Status Comment   MRSA by PCR NEGATIVE  NEGATIVE Final     Studies/Results: No results found.  Assessment/Plan: 1) seroma - MSSA.  Now with augmentin allergy.  I will change her to doxycycline 100 mg bid and again opt for 2-3 weeks of treatment.  2) leukocytosis - Possibly related to post op course.  Low grade fever but patient otherwise not more symptomatic with no chills.  Will monitor.       Staci Righter, MD [facility] [phone] pager   10/24/2011, 11:50 AM

## 2011-10-25 LAB — BASIC METABOLIC PANEL
GFR calc Af Amer: 42 mL/min — ABNORMAL LOW (ref >90)
GFR calc non Af Amer: 37 mL/min — ABNORMAL LOW (ref >90)
Potassium: 4.7 mEq/L (ref 3.5–5.1)
Sodium: 133 mEq/L — ABNORMAL LOW (ref 135–145)

## 2011-10-25 LAB — CBC
MCV: 91.3 fL (ref 78.0–100.0)
Platelets: 278 10*3/uL (ref 150–400)
RBC: 4.16 MIL/uL (ref 3.87–5.11)
WBC: 26 10*3/uL — ABNORMAL HIGH (ref 4.0–10.5)

## 2011-10-25 LAB — GLUCOSE, CAPILLARY: Glucose-Capillary: 170 mg/dL — ABNORMAL HIGH (ref 70–99)

## 2011-10-25 MED ORDER — HYDROMORPHONE HCL PF 1 MG/ML IJ SOLN: 1.0000 mg | INTRAMUSCULAR | Status: DC | PRN

## 2011-10-25 MED ORDER — OXYCODONE-ACETAMINOPHEN 5-325 MG PO TABS
1.0000 | ORAL_TABLET | ORAL | Status: DC | PRN
  Administered 2011-10-25 – 2011-10-26 (×5): 1 via ORAL
  Administered 2011-10-27 (×2): 2 via ORAL
  Administered 2011-10-27: 1 via ORAL
  Administered 2011-10-28: 2 via ORAL
  Administered 2011-10-28: 1 via ORAL
  Administered 2011-10-28: 2 via ORAL
  Administered 2011-10-29: 1 via ORAL
  Filled 2011-10-25 (×2): qty 2
  Filled 2011-10-25 (×3): qty 1
  Filled 2011-10-25: qty 2
  Filled 2011-10-25: qty 1
  Filled 2011-10-25 (×3): qty 2
  Filled 2011-10-25: qty 1
  Filled 2011-10-25: qty 2
  Filled 2011-10-25: qty 1

## 2011-10-25 MED ORDER — ENOXAPARIN SODIUM 80 MG/0.8ML ~~LOC~~ SOLN
80.0000 mg | SUBCUTANEOUS | Status: DC
  Administered 2011-10-25 – 2011-10-28 (×4): 80 mg via SUBCUTANEOUS
  Filled 2011-10-25 (×5): qty 0.8

## 2011-10-25 NOTE — Progress Notes (Signed)
3 Days Post-Op  Subjective: Patient is now on PO Doxycyline.   Less facial/ hand swelling - rash resolved Now having watery diarrhea with some abdominal cramping Soreness around incision/ lower pannus slightly improved.  Objective: Vital signs in last 24 hours: Temp:  [97.3 F (36.3 C)-98.2 F (36.8 C)] 97.4 F (36.3 C) (10/03 0518) Pulse Rate:  [85-108] 85  (10/03 0150) Resp:  [18-24] 20  (10/03 0518) BP: (94-114)/(39-65) 94/62 mmHg (10/03 0518) SpO2:  [93 %-95 %] 95 % (10/03 0518) Last BM Date: 10/23/11  Intake/Output from previous day: 10/02 0701 - 10/03 0700 In: 2097.1 [P.O.:600; I.V.:1487.1] Out: 172 [Urine:100; Drains:70; Stool:2] Intake/Output this shift:    Incision c/d/i Drain - output is more serous Old drain site - minimal purulent drainage Pannus - decreased erythema; still thickened Abd - active bowel sounds, incisional tenderness   Lab Results:   Basename 10/25/11 0510 10/24/11 0640  WBC 26.0* 22.0*  HGB 12.4 10.7*  HCT 38.0 33.4*  PLT 278 248   BMET  Basename 10/24/11 0640 10/22/11 1825  NA 133* 134*  K 4.8 5.2*  CL 100 100  CO2 21 25  GLUCOSE 147* 108*  BUN 18 22  CREATININE 0.91 1.07  CALCIUM 8.0* 8.4   PT/INR No results found for this basename: LABPROT:2,INR:2 in the last 72 hours ABG No results found for this basename: PHART:2,PCO2:2,PO2:2,HCO3:2 in the last 72 hours  Studies/Results: No results found.  Anti-infectives: Anti-infectives     Start     Dose/Rate Route Frequency Ordered Stop   10/25/11 0800   doxycycline (VIBRA-TABS) tablet 100 mg        100 mg Oral Every 12 hours 10/24/11 2151     10/24/11 1300   doxycycline (VIBRA-TABS) tablet 100 mg  Status:  Discontinued        100 mg Oral Every 12 hours 10/24/11 1150 10/24/11 2151   10/23/11 1200   amoxicillin-clavulanate (AUGMENTIN) 875-125 MG per tablet 1 tablet  Status:  Discontinued        1 tablet Oral 2 times daily with meals 10/23/11 1123 10/23/11 2052   10/22/11 1930    DAPTOmycin (CUBICIN) 900 mg in sodium chloride 0.9 % IVPB  Status:  Discontinued        900 mg 236 mL/hr over 30 Minutes Intravenous Every 24 hours 10/22/11 1732 10/23/11 1123   10/22/11 1730   rifampin (RIFADIN) capsule 300 mg  Status:  Discontinued        300 mg Oral Daily 10/22/11 1725 10/23/11 1123   10/21/11 2200   vancomycin (VANCOCIN) 1,500 mg in sodium chloride 0.9 % 500 mL IVPB  Status:  Discontinued        1,500 mg 250 mL/hr over 120 Minutes Intravenous Every 12 hours 10/21/11 0945 10/22/11 1725   10/21/11 1000   vancomycin (VANCOCIN) 2,500 mg in sodium chloride 0.9 % 500 mL IVPB        2,500 mg 250 mL/hr over 120 Minutes Intravenous NOW 10/21/11 0945 10/21/11 1337   10/19/11 1400   acyclovir (ZOVIRAX) 200 MG capsule 400 mg        400 mg Oral 2 times daily 10/19/11 1122     10/19/11 1200   ertapenem (INVANZ) 1 g in sodium chloride 0.9 % 50 mL IVPB  Status:  Discontinued        1 g 100 mL/hr over 30 Minutes Intravenous Every 24 hours 10/19/11 1017 10/21/11 0944          Assessment/Plan: s/p  Procedure(s) (LRB) with comments: LAPAROSCOPY DIAGNOSTIC (N/A) LAPAROSCOPIC LYSIS OF ADHESIONS () Imp:  Seroma with MSSA - now on Doxycyline per ID recommendations Leukocytosis with watery diarrhea/ cramping - rule out C. Diff Ambulate Decrease IV fluids   LOS: 6 days    Eyanna Mcgonagle K. 10/25/2011

## 2011-10-25 NOTE — Progress Notes (Signed)
Notified physician of pt's request for ibuprofen for body aches. Received new order for ibuprofen.

## 2011-10-25 NOTE — Progress Notes (Signed)
Notified physician of pt complaint of itching and rash in multiple locations on body. Received one time order for benadryl.

## 2011-10-25 NOTE — Progress Notes (Signed)
    [facility]  Date of Admission:  10/19/2011  Antibiotics: daptomycin 9/30 Rifampin 9/30-10/1 Doxycycline 10/2-  Subjective: No new issues  Objective: Temp:  [97.3 F (36.3 C)-98.2 F (36.8 C)] 97.6 F (36.4 C) (10/03 1012) Pulse Rate:  [73-108] 73  (10/03 1012) Resp:  [20-24] 20  (10/03 1012) BP: (94-114)/(39-65) 114/58 mmHg (10/03 1012) SpO2:  [94 %-96 %] 96 % (10/03 1012)  General: awake, nad    Lab Results Lab Results  Component Value Date   WBC 26.0* 10/25/2011   HGB 12.4 10/25/2011   HCT 38.0 10/25/2011   MCV 91.3 10/25/2011   PLT 278 10/25/2011    Lab Results  Component Value Date   CREATININE 1.73* 10/25/2011   BUN 34* 10/25/2011   NA 133* 10/25/2011   K 4.7 10/25/2011   CL 99 10/25/2011   CO2 22 10/25/2011    Lab Results  Component Value Date   ALT 11 10/22/2011   AST 23 10/22/2011   ALKPHOS 94 10/22/2011   BILITOT 1.4* 10/22/2011      Microbiology: Recent Results (from the past 240 hour(s))  WOUND CULTURE     Status: Normal   Collection Time   10/18/11  2:47 PM      Component Value Range Status Comment   Culture Moderate STAPHYLOCOCCUS AUREUS   Final    GRAM STAIN Abundant   Final    GRAM STAIN WBC present-predominately PMN   Final    GRAM STAIN No Squamous Epithelial Cells Seen   Final    GRAM STAIN Moderate Gram Positive Cocci In Pairs In Clusters   Final    Organism ID, Bacteria STAPHYLOCOCCUS AUREUS   Final   CULTURE, ROUTINE-ABSCESS     Status: Normal   Collection Time   10/19/11  4:32 PM      Component Value Range Status Comment   Specimen Description ABSCESS ABDOMEN DRAINAGE   Final    Special Requests Normal   Final    Gram Stain     Final    Value: ABUNDANT WBC PRESENT, PREDOMINANTLY PMN     NO SQUAMOUS EPITHELIAL CELLS SEEN     FEW GRAM POSITIVE COCCI     IN PAIRS   Culture     Final    Value: ABUNDANT STAPHYLOCOCCUS AUREUS     Note: RIFAMPIN AND GENTAMICIN SHOULD NOT BE USED AS SINGLE DRUGS FOR TREATMENT OF  STAPH INFECTIONS.   Report Status 10/22/2011 FINAL   Final    Organism ID, Bacteria STAPHYLOCOCCUS AUREUS   Final   MRSA PCR SCREENING     Status: Normal   Collection Time   10/22/11  8:56 AM      Component Value Range Status Comment   MRSA by PCR NEGATIVE  NEGATIVE Final   CLOSTRIDIUM DIFFICILE BY PCR     Status: Normal   Collection Time   10/25/11 10:41 AM      Component Value Range Status Comment   C difficile by pcr NEGATIVE  NEGATIVE Final     Studies/Results: No results found.  Assessment/Plan: 1) seroma - MSSA.  Now with augmentin allergy.  Tolerating Doxycycline.   2) leukocytosis - appears to be non specific leukemoid reaction.  It did go up more again. C diff PCR is negative.  No fever  Now for 2 days.     Staci Righter, MD [facility] for Infectious Disease [facility] [phone] pager   10/25/2011, 3:30 PM

## 2011-10-26 DIAGNOSIS — R609 Edema, unspecified: Secondary | ICD-10-CM

## 2011-10-26 LAB — GLUCOSE, CAPILLARY

## 2011-10-26 LAB — CBC
MCH: 29.7 pg (ref 26.0–34.0)
MCHC: 32.9 g/dL (ref 30.0–36.0)
Platelets: 219 10*3/uL (ref 150–400)

## 2011-10-26 LAB — BASIC METABOLIC PANEL
BUN: 47 mg/dL — ABNORMAL HIGH (ref 6–23)
Calcium: 7.9 mg/dL — ABNORMAL LOW (ref 8.4–10.5)
GFR calc non Af Amer: 35 mL/min — ABNORMAL LOW (ref >90)
Glucose, Bld: 107 mg/dL — ABNORMAL HIGH (ref 70–99)
Sodium: 130 mEq/L — ABNORMAL LOW (ref 135–145)

## 2011-10-26 MED ORDER — FUROSEMIDE 20 MG PO TABS
20.0000 mg | ORAL_TABLET | Freq: Once | ORAL | Status: AC
  Administered 2011-10-26: 20 mg via ORAL
  Filled 2011-10-26: qty 1

## 2011-10-26 NOTE — Progress Notes (Signed)
VASCULAR LAB PRELIMINARY  PRELIMINARY  PRELIMINARY  PRELIMINARY  Bilateral lower extremity venous duplex  completed.    Preliminary report:  Bilateral:  No obvious evidence of DVT, superficial thrombosis, or Baker's Cyst.  Technically limited by body habitus and inability to lie flat.   Kaizlee Carlino, RVT 10/26/2011, 12:07 PM

## 2011-10-26 NOTE — Progress Notes (Signed)
4 Days Post-Op   Assessment: s/p Procedure(s): LAPAROSCOPY DIAGNOSTIC LAPAROSCOPIC LYSIS OF ADHESIONS Patient Active Problem List  Diagnosis  . MRSA  . CERVICAL CANCER  . HYPOTHYROIDISM  . POLYCYSTIC OVARIES  . OBESITY, MORBID  . BIPOLAR DISORDER UNSPECIFIED  . BIPOLAR II DISORDER  . DEPRESSION  . MIGRAINE HEADACHE  . HYPERTENSION  . HEMORRHOIDS  . GASTROPARESIS  . RECTAL BLEEDING  . UTI  . ABDOMINAL PREGNANCY WITH INTRAUTERINE PREGNANCY  . SLEEP APNEA  . HEADACHE, CHRONIC  . NAUSEA AND VOMITING  . DIARRHEA  . ABDOMINAL PAIN-EPIGASTRIC  . ADVERSE DRUG REACTION  . RENAL CALCULUS, HX OF  . Skin lesions, generalized  . Ventral hernia s/p lap. repair with mesh 10/03/11  . DM type 2 (diabetes mellitus, type 2)  . Postop check  . Abdominal abscess  . S/P repair of ventral hernia    Improving status post percutaneous drainage and laparoscopic drainage of wound fluid collections Bilateral lower extremity edema, mild  Plan: continue current antibiotic management. Today's white count is still pending. We'll obtain Doppler studies of her lower extremities to rule out DVT. We'll also give her one dose of Lasix to see if this reduces some of the edema.  Subjective: She feels somewhat better today. She is not having any wheezing. She has bilateral ankle edema. He is not having any calf pain. Her drainage hasn't changed any. She may be having a little but of less discomfort around the abdomen.  Objective: Vital signs in last 24 hours: Temp:  [97.6 F (36.4 C)-97.9 F (36.6 C)] 97.9 F (36.6 C) (10/04 0543) Pulse Rate:  [73-92] 92  (10/04 0543) Resp:  [18-20] 18  (10/04 0543) BP: (114-120)/(53-58) 117/53 mmHg (10/04 0543) SpO2:  [96 %] 96 % (10/04 0543)   Intake/Output from previous day: 10/03 0701 - 10/04 0700 In: 972.5 [I.V.:947.5] Out: 50 [Drains:50] Intake/Output this shift:     General appearance: alert, cooperative and no distress Resp: clear to auscultation  bilaterally GI: patient up in chair and abdominal binder on to inspect the wound. She seems nontender.  Incision: thin drainage from the JP.  Lab Results:   Basename 10/25/11 0510 10/24/11 0640  WBC 26.0* 22.0*  HGB 12.4 10.7*  HCT 38.0 33.4*  PLT 278 248   BMET  Basename 10/25/11 0510 10/24/11 0640  NA 133* 133*  K 4.7 4.8  CL 99 100  CO2 22 21  GLUCOSE 156* 147*  BUN 34* 18  CREATININE 1.73* 0.91  CALCIUM 8.3* 8.0*   PT/INR No results found for this basename: LABPROT:2,INR:2 in the last 72 hours ABG No results found for this basename: PHART:2,PCO2:2,PO2:2,HCO3:2 in the last 72 hours  MEDS, Scheduled    . acyclovir  400 mg Oral BID  . atenolol  50 mg Oral QHS  . Chlorhexidine Gluconate Cloth  6 each Topical Q0600  . cycloSPORINE  200 mg Oral BID  . divalproex  500 mg Oral BID  . docusate sodium  100 mg Oral BID  . doxycycline  100 mg Oral Q12H  . enoxaparin  80 mg Subcutaneous Q24H  . insulin aspart  0-20 Units Subcutaneous TID WC  . insulin aspart  0-5 Units Subcutaneous QHS  . levothyroxine  112 mcg Oral QAC breakfast  . mupirocin ointment   Nasal BID  . pantoprazole  40 mg Oral QHS  . DISCONTD: enoxaparin  40 mg Subcutaneous Q24H    Studies/Results: No results found.    LOS: 7 days  Currie Paris, MD, [facility], [place] [phone]   10/26/2011 7:38 AM

## 2011-10-26 NOTE — Progress Notes (Signed)
[facility]  Date of Admission:  10/19/2011  Antibiotics: daptomycin 9/30 Rifampin 9/30-10/1 Doxycycline 10/2-  Subjective: No new issues, swelling gone  Objective: Temp:  [97.6 F (36.4 C)-97.9 F (36.6 C)] 97.9 F (36.6 C) (10/04 0543) Pulse Rate:  [76-92] 92  (10/04 0543) Resp:  [18-20] 18  (10/04 0543) BP: (117-120)/(53-56) 117/53 mmHg (10/04 0543) SpO2:  [96 %] 96 % (10/04 0543)  General: awake, nad CV RRR   Lab Results Lab Results  Component Value Date   WBC 16.6* 10/26/2011   HGB 10.2* 10/26/2011   HCT 31.0* 10/26/2011   MCV 90.4 10/26/2011   PLT 219 10/26/2011    Lab Results  Component Value Date   CREATININE 1.80* 10/26/2011   BUN 47* 10/26/2011   NA 130* 10/26/2011   K 4.2 10/26/2011   CL 97 10/26/2011   CO2 22 10/26/2011    Lab Results  Component Value Date   ALT 11 10/22/2011   AST 23 10/22/2011   ALKPHOS 94 10/22/2011   BILITOT 1.4* 10/22/2011      Microbiology: Recent Results (from the past 240 hour(s))  WOUND CULTURE     Status: Normal   Collection Time   10/18/11  2:47 PM      Component Value Range Status Comment   Culture Moderate STAPHYLOCOCCUS AUREUS   Final    GRAM STAIN Abundant   Final    GRAM STAIN WBC present-predominately PMN   Final    GRAM STAIN No Squamous Epithelial Cells Seen   Final    GRAM STAIN Moderate Gram Positive Cocci In Pairs In Clusters   Final    Organism ID, Bacteria STAPHYLOCOCCUS AUREUS   Final   CULTURE, ROUTINE-ABSCESS     Status: Normal   Collection Time   10/19/11  4:32 PM      Component Value Range Status Comment   Specimen Description ABSCESS ABDOMEN DRAINAGE   Final    Special Requests Normal   Final    Gram Stain     Final    Value: ABUNDANT WBC PRESENT, PREDOMINANTLY PMN     NO SQUAMOUS EPITHELIAL CELLS SEEN     FEW GRAM POSITIVE COCCI     IN PAIRS   Culture     Final    Value: ABUNDANT STAPHYLOCOCCUS AUREUS     Note: RIFAMPIN AND GENTAMICIN SHOULD NOT BE USED AS SINGLE DRUGS  FOR TREATMENT OF STAPH INFECTIONS.   Report Status 10/22/2011 FINAL   Final    Organism ID, Bacteria STAPHYLOCOCCUS AUREUS   Final   MRSA PCR SCREENING     Status: Normal   Collection Time   10/22/11  8:56 AM      Component Value Range Status Comment   MRSA by PCR NEGATIVE  NEGATIVE Final   CLOSTRIDIUM DIFFICILE BY PCR     Status: Normal   Collection Time   10/25/11 10:41 AM      Component Value Range Status Comment   C difficile by pcr NEGATIVE  NEGATIVE Final     Studies/Results: No results found.  Assessment/Plan: 1) seroma - MSSA.  Now with augmentin allergy.  Tolerating Doxycycline. As before, she should continue for 2-3 weeks and then can stop if there are no new concerns.  2) leukocytosis - much improved.   I will be available over the weekend if needed.     Staci Righter, MD [facility] for Infectious Disease [facility] [phone] pager   10/26/2011,  12:57 PM

## 2011-10-27 DIAGNOSIS — N289 Disorder of kidney and ureter, unspecified: Secondary | ICD-10-CM

## 2011-10-27 LAB — GLUCOSE, CAPILLARY
Glucose-Capillary: 125 mg/dL — ABNORMAL HIGH (ref 70–99)
Glucose-Capillary: 105 mg/dL — ABNORMAL HIGH (ref 70–99)

## 2011-10-27 LAB — CBC
HCT: 28.7 % — ABNORMAL LOW (ref 36.0–46.0)
Hemoglobin: 9.4 g/dL — ABNORMAL LOW (ref 12.0–15.0)
MCH: 29.3 pg (ref 26.0–34.0)
MCHC: 32.8 g/dL (ref 30.0–36.0)

## 2011-10-27 MED ORDER — ENSURE PUDDING PO PUDG
1.0000 | Freq: Three times a day (TID) | ORAL | Status: DC
  Administered 2011-10-27 – 2011-10-29 (×3): 1 via ORAL

## 2011-10-27 NOTE — Progress Notes (Signed)
Patient ID: [name], female   DOB: Dec 26, 1975, 36 y.o.   MRN: [id number] 5 Days Post-Op  Subjective: Pt c/o diffuse swelling.  Doesn't feel well.  Still with nausea, which she states is somewhat chronic.  Eating some clear liquids and occasional saltine cracker, "everything else is too heavy."  Pt c/o buttock pain from sitting in her chair so much.  Objective: Vital signs in last 24 hours: Temp:  [97.7 F (36.5 C)-98.5 F (36.9 C)] 98.5 F (36.9 C) (10/05 0618) Pulse Rate:  [72-81] 75  (10/05 0618) Resp:  [16-20] 20  (10/05 0618) BP: (114-148)/(49-86) 148/86 mmHg (10/05 0618) SpO2:  [97 %-99 %] 99 % (10/05 0618) Last BM Date: 10/26/11  Intake/Output from previous day: 10/04 0701 - 10/05 0700 In: 815 [I.V.:800] Out: 85 [Drains:85] Intake/Output this shift:    PE: Abd: soft, tender, +BS, incisions c/d/i with dermabond.  Drain with serous output  Lab Results:   Basename 10/26/11 0630 10/25/11 0510  WBC 16.6* 26.0*  HGB 10.2* 12.4  HCT 31.0* 38.0  PLT 219 278   BMET  Basename 10/26/11 0630 10/25/11 0510  NA 130* 133*  K 4.2 4.7  CL 97 99  CO2 22 22  GLUCOSE 107* 156*  BUN 47* 34*  CREATININE 1.80* 1.73*  CALCIUM 7.9* 8.3*   PT/INR No results found for this basename: LABPROT:2,INR:2 in the last 72 hours CMP     Component Value Date/Time   NA 130* 10/26/2011 0630   K 4.2 10/26/2011 0630   CL 97 10/26/2011 0630   CO2 22 10/26/2011 0630   GLUCOSE 107* 10/26/2011 0630   BUN 47* 10/26/2011 0630   CREATININE 1.80* 10/26/2011 0630   CREATININE 1.10 10/18/2011 1450   CALCIUM 7.9* 10/26/2011 0630   PROT 5.8* 10/22/2011 1825   ALBUMIN 1.9* 10/22/2011 1825   AST 23 10/22/2011 1825   ALT 11 10/22/2011 1825   ALKPHOS 94 10/22/2011 1825   BILITOT 1.4* 10/22/2011 1825   GFRNONAA 35* 10/26/2011 0630   GFRAA 40* 10/26/2011 0630   Lipase     Component Value Date/Time   LIPASE 43.0 07/22/2009 1022       Studies/Results: No results  found.  Anti-infectives: Anti-infectives     Start     Dose/Rate Route Frequency Ordered Stop   10/25/11 0800   doxycycline (VIBRA-TABS) tablet 100 mg        100 mg Oral Every 12 hours 10/24/11 2151     10/24/11 1300   doxycycline (VIBRA-TABS) tablet 100 mg  Status:  Discontinued        100 mg Oral Every 12 hours 10/24/11 1150 10/24/11 2151   10/23/11 1200   amoxicillin-clavulanate (AUGMENTIN) 875-125 MG per tablet 1 tablet  Status:  Discontinued        1 tablet Oral 2 times daily with meals 10/23/11 1123 10/23/11 2052   10/22/11 1930   DAPTOmycin (CUBICIN) 900 mg in sodium chloride 0.9 % IVPB  Status:  Discontinued        900 mg 236 mL/hr over 30 Minutes Intravenous Every 24 hours 10/22/11 1732 10/23/11 1123   10/22/11 1730   rifampin (RIFADIN) capsule 300 mg  Status:  Discontinued        300 mg Oral Daily 10/22/11 1725 10/23/11 1123   10/21/11 2200   vancomycin (VANCOCIN) 1,500 mg in sodium chloride 0.9 % 500 mL IVPB  Status:  Discontinued        1,500 mg 250 mL/hr over 120 Minutes Intravenous  Every 12 hours 10/21/11 0945 10/22/11 1725   10/21/11 1000   vancomycin (VANCOCIN) 2,500 mg in sodium chloride 0.9 % 500 mL IVPB        2,500 mg 250 mL/hr over 120 Minutes Intravenous NOW 10/21/11 0945 10/21/11 1337   10/19/11 1400   acyclovir (ZOVIRAX) 200 MG capsule 400 mg        400 mg Oral 2 times daily 10/19/11 1122     10/19/11 1200   ertapenem (INVANZ) 1 g in sodium chloride 0.9 % 50 mL IVPB  Status:  Discontinued        1 g 100 mL/hr over 30 Minutes Intravenous Every 24 hours 10/19/11 1017 10/21/11 0944           Assessment/Plan  1. S/p dx laparoscopy with drainage of seroma, s/p VHR 2. ARI 3. Diffuse edema  Plan: 1. Will recheck labs in the morning.  Given a dose of Lasix yesterday, but Cr 1.8.  Will hold off on lasix today and check Cr in the morning.  If ok, will give a dose of lasix in the morning.   2. Cont her diet. 3, IVFs are at 50cc/hr.  Do not feel I can KVO  this as the patient is not taking in much PO. 4. She needs to ambulate in the halls more as she is complaining of buttock pain secondary to minimal mobilization.   LOS: 8 days    Wisehart,Tilden Broz E 10/27/2011, 8:21 AM Pager: [phone]

## 2011-10-27 NOTE — Progress Notes (Signed)
Not eating much. Walked twice in halls. Nausea. No emesis  Soft, obese. Tender. No guarding/rt  Cont current course Cont abx Ambulate more F/u Cr in am Ensure shakes  Mary Sella. Andrey Campanile, MD, FACS General, Bariatric, & Minimally Invasive Surgery [facility], [place]

## 2011-10-28 LAB — BASIC METABOLIC PANEL
CO2: 23 mEq/L (ref 19–32)
Chloride: 103 mEq/L (ref 96–112)
Sodium: 134 mEq/L — ABNORMAL LOW (ref 135–145)

## 2011-10-28 LAB — CBC
Platelets: 214 10*3/uL (ref 150–400)
RBC: 3.03 MIL/uL — ABNORMAL LOW (ref 3.87–5.11)
WBC: 12.7 10*3/uL — ABNORMAL HIGH (ref 4.0–10.5)

## 2011-10-28 LAB — GLUCOSE, CAPILLARY: Glucose-Capillary: 114 mg/dL — ABNORMAL HIGH (ref 70–99)

## 2011-10-28 MED ORDER — CYCLOSPORINE 100 MG PO CAPS
100.0000 mg | ORAL_CAPSULE | Freq: Two times a day (BID) | ORAL | Status: DC
  Administered 2011-10-28 – 2011-10-29 (×3): 100 mg via ORAL
  Filled 2011-10-28 (×4): qty 1

## 2011-10-28 MED ORDER — FUROSEMIDE 10 MG/ML IJ SOLN
40.0000 mg | Freq: Once | INTRAMUSCULAR | Status: AC
  Administered 2011-10-28: 40 mg via INTRAVENOUS
  Filled 2011-10-28: qty 4

## 2011-10-28 NOTE — Progress Notes (Signed)
Pt sitting on commode Exam deferred Discussed pt with PA  Plan as below  Mary Sella. Andrey Campanile, MD, FACS General, Bariatric, & Minimally Invasive Surgery [facility], [place]

## 2011-10-28 NOTE — Progress Notes (Signed)
Patient ID: [name], female   DOB: [birth year], 36 y.o.   MRN: [id number] 6 Days Post-Op  Subjective: Pt feels bad today.  Thinks she may have over done it by ambulating in the halls yesterday.  Able to tolerate some of the ensure shakes and ate a little more in general yesterday.    Objective: Vital signs in last 24 hours: Temp:  [97.6 F (36.4 C)-98.8 F (37.1 C)] 97.6 F (36.4 C) (10/06 0542) Pulse Rate:  [53-99] 53  (10/06 0542) Resp:  [18-20] 20  (10/06 0542) BP: (119-144)/(65-88) 119/65 mmHg (10/06 0542) SpO2:  [98 %-100 %] 100 % (10/06 0542) Last BM Date: 10/27/11  Intake/Output from previous day: 10/05 0701 - 10/06 0700 In: 480 [P.O.:480] Out: 78 [Urine:5; Drains:70; Stool:3] Intake/Output this shift:    PE: Abd: soft, tender, drain with serous output, +BS, morbidly obese Ext: all extremities with mild edema  Lab Results:   Basename 10/28/11 0635 10/27/11 0815  WBC 12.7* 13.1*  HGB 8.8* 9.4*  HCT 27.6* 28.7*  PLT 214 208   BMET  Basename 10/28/11 0635 10/26/11 0630  NA 134* 130*  K 4.2 4.2  CL 103 97  CO2 23 22  GLUCOSE 94 107*  BUN 23 47*  CREATININE 0.83 1.80*  CALCIUM 7.8* 7.9*   PT/INR No results found for this basename: LABPROT:2,INR:2 in the last 72 hours CMP     Component Value Date/Time   NA 134* 10/28/2011 0635   K 4.2 10/28/2011 0635   CL 103 10/28/2011 0635   CO2 23 10/28/2011 0635   GLUCOSE 94 10/28/2011 0635   BUN 23 10/28/2011 0635   CREATININE 0.83 10/28/2011 0635   CREATININE 1.10 10/18/2011 1450   CALCIUM 7.8* 10/28/2011 0635   PROT 5.8* 10/22/2011 1825   ALBUMIN 1.9* 10/22/2011 1825   AST 23 10/22/2011 1825   ALT 11 10/22/2011 1825   ALKPHOS 94 10/22/2011 1825   BILITOT 1.4* 10/22/2011 1825   GFRNONAA 89* 10/28/2011 0635   GFRAA >90 10/28/2011 0635   Lipase     Component Value Date/Time   LIPASE 43.0 07/22/2009 1022       Studies/Results: No results found.  Anti-infectives: Anti-infectives     Start     Dose/Rate  Route Frequency Ordered Stop   10/25/11 0800   doxycycline (VIBRA-TABS) tablet 100 mg        100 mg Oral Every 12 hours 10/24/11 2151     10/24/11 1300   doxycycline (VIBRA-TABS) tablet 100 mg  Status:  Discontinued        100 mg Oral Every 12 hours 10/24/11 1150 10/24/11 2151   10/23/11 1200   amoxicillin-clavulanate (AUGMENTIN) 875-125 MG per tablet 1 tablet  Status:  Discontinued        1 tablet Oral 2 times daily with meals 10/23/11 1123 10/23/11 2052   10/22/11 1930   DAPTOmycin (CUBICIN) 900 mg in sodium chloride 0.9 % IVPB  Status:  Discontinued        900 mg 236 mL/hr over 30 Minutes Intravenous Every 24 hours 10/22/11 1732 10/23/11 1123   10/22/11 1730   rifampin (RIFADIN) capsule 300 mg  Status:  Discontinued        300 mg Oral Daily 10/22/11 1725 10/23/11 1123   10/21/11 2200   vancomycin (VANCOCIN) 1,500 mg in sodium chloride 0.9 % 500 mL IVPB  Status:  Discontinued        1,500 mg 250 mL/hr over 120 Minutes Intravenous Every 12  hours 10/21/11 0945 10/22/11 1725   10/21/11 1000   vancomycin (VANCOCIN) 2,500 mg in sodium chloride 0.9 % 500 mL IVPB        2,500 mg 250 mL/hr over 120 Minutes Intravenous NOW 10/21/11 0945 10/21/11 1337   10/19/11 1400   acyclovir (ZOVIRAX) 200 MG capsule 400 mg        400 mg Oral 2 times daily 10/19/11 1122     10/19/11 1200   ertapenem (INVANZ) 1 g in sodium chloride 0.9 % 50 mL IVPB  Status:  Discontinued        1 g 100 mL/hr over 30 Minutes Intravenous Every 24 hours 10/19/11 1017 10/21/11 0944           Assessment/Plan  1. S/p VHR with seroma 2. S/p dx laparoscopy with drainage of seroma 3. Deconditioning 4. Morbidly obese 5. Bipolar d/o  Plan: 1. Cont to encourage diet and ensure shakes. 2. Cont to mobilize 3. Cont drain and abx 4. Creatinine is normal today, will given another dose of lasix for peripheral edema 5. Cyclosporine decreased to 100mg  BID per patient's normal home dose, per patient request.  LOS: 9 days     Mira,Jeniah Kishi E 10/28/2011, 8:30 AM Pager: [phone]

## 2011-10-29 ENCOUNTER — Encounter (INDEPENDENT_AMBULATORY_CARE_PROVIDER_SITE_OTHER): Payer: Medicaid Other | Admitting: Surgery

## 2011-10-29 LAB — CBC
HCT: 27.5 % — ABNORMAL LOW (ref 36.0–46.0)
Hemoglobin: 8.8 g/dL — ABNORMAL LOW (ref 12.0–15.0)
MCH: 29.5 pg (ref 26.0–34.0)
MCHC: 32 g/dL (ref 30.0–36.0)
RDW: 15.8 % — ABNORMAL HIGH (ref 11.5–15.5)

## 2011-10-29 LAB — BASIC METABOLIC PANEL
BUN: 15 mg/dL (ref 6–23)
Chloride: 102 mEq/L (ref 96–112)
Creatinine, Ser: 0.73 mg/dL (ref 0.50–1.10)
GFR calc Af Amer: >90 mL/min (ref >90)
Glucose, Bld: 106 mg/dL — ABNORMAL HIGH (ref 70–99)
Potassium: 4 mEq/L (ref 3.5–5.1)

## 2011-10-29 LAB — GLUCOSE, CAPILLARY: Glucose-Capillary: 116 mg/dL — ABNORMAL HIGH (ref 70–99)

## 2011-10-29 MED ORDER — OXYCODONE-ACETAMINOPHEN 5-325 MG PO TABS: 1.0000 | ORAL_TABLET | ORAL | Status: DC | PRN

## 2011-10-29 MED ORDER — DOXYCYCLINE HYCLATE 100 MG PO TABS: 100.0000 mg | ORAL_TABLET | Freq: Two times a day (BID) | ORAL | Status: DC

## 2011-10-29 NOTE — Discharge Summary (Signed)
Physician Discharge Summary  Patient ID: [name] MRN: [id number] DOB/AGE: 1975/11/10 36 y.o.  Admit date: 10/19/2011 Discharge date: 10/29/2011  Admission Diagnoses:  Fever, leukocytosis  Discharge Diagnoses:  Infected seroma - ventral hernia Principal Problem:  *Abdominal abscess Active Problems:  S/P repair of ventral hernia Diabetes Morbid obesity  Discharged Condition: good  Hospital Course: This patient underwent open repair of a large ventral hernia on 10/03/11.  She had her subcutaneous drain removed on 10/16/11, but presented to the emergency department on 9/27 with fever, leukocytosis, and increasing pain.  CT scan showed fluid collection under the fascia.  This was percutaneously drained by IR and cultures have grown MSSA.  She underwent diagnostic laparoscopy with lysis of adhesions 10/22/11 and the seroma fluid was clear and not purulent.  The decision was made to leave the mesh in place.  Infectious disease was consulted and helped to find an appropriate antibiotic regimen.  Augmentin caused an allergic reaction, and the patient was eventually switched to Doxcycline which she is tolerating well.  She began feeling better.  Mobilization was difficult due to her size, but she has improved.  WBC is almost normal and she has been afebrile.    Consults: ID  Significant Diagnostic Studies: radiology: CT Scan  RADIOLOGY REPORT*  Clinical Data: Status post abdominal drain. Persistent moderate  amount of pain incision site. Drainage from tube. Elevated white  blood cell count. Febrile. Evaluate for new collections. History  of cervical cancer. Diabetes. Gastroparesis.  CT ABDOMEN AND PELVIS WITH CONTRAST  Technique: Multidetector CT imaging of the abdomen and pelvis was  performed following the standard protocol during bolus  administration of intravenous contrast.  Contrast: 100 ml Omnipaque-300  Comparison: 10/18/2011 and drainage study of 10/18/2011.  Findings:  Mild to moderate degradation secondary patient body  habitus. Clear lung bases. Mild cardiomegaly. Severe hepatic  steatosis and hepatomegaly. No focal liver lesions. Normal spleen,  stomach, pancreas. Gallbladder is mildly distended, without stone  or specific evidence of acute cholecystitis. No biliary ductal  dilatation. Normal adrenal glands. Lower pole left renal  calculus. Normal right kidney.  Small retroperitoneal nodes which are not pathologic by size  criteria. There is prominent porta hepatis node which is likely  reactive. This measures 1.7 cm on image 32.  Normal colon and terminal ileum. Appendix likely identified on  image 71.  Normal small bowel without abdominal ascites.  No pelvic adenopathy. Normal urinary bladder and uterus. No  adnexal mass.  Edema within the anterior pannus, including on image 88 which is  not significantly changed.  Percutaneous drain has been placed in the complex anterior  abdominal wall fluid and gas collection. A small amount of  residual edema and gas identified inferior to the catheter on image  64. Small amount of ill-defined fluid is identified superior to  the drain on image 52. Deep to the drain and rectus musculature is  a fluid collection which measures 7.0 x 4.0 cm on image 55. This  portion measures 6.3 x 3.2 cm on the prior exam.  In addition, extending deep from the superficial portion of the  wall collection is an ill-defined intraperitoneal collection which  tracks to the central left lobe of the liver and measures 3.6 x 5.0  cm on image 31.  There is diffuse anterior abdominal wall edema which is increased.  No acute osseous abnormality.  IMPRESSION:  1. Mild to moderately degraded exam secondary patient body  habitus.  2. Percutaneous drain placement within  a complex anterior  abdominal wall collection. The superficial portion of the  collection is decreased. The portion deep to the rectus  musculature is slightly  increased in size, and may not be being  drained by the percutaneous catheter.  3. More cephalad fluid tracking to a collection in the porta  hepatis region about the central aspect of the left lobe of the  liver. This finding was called to the patient's nurse, Kim, at  6:00 p.m.  4. Gallbladder distention without specific evidence of acute  cholecystitis. This could be secondary to the adjacent fluid  collection.  5. Hepatic steatosis and hepatomegaly.  6. Possible cellulitis and panniculitis about the anterior  abdominal wall.  7. Nephrolithiasis.  Original Report Authenticated By: Consuello Bossier, M.D.  Treatments: IV hydration, antibiotics: Invanz and Doxycycline and surgery: diagnostic laparoscopy and lysis of adhesions 10/22/11  Discharge Exam: Blood pressure 140/77, pulse 66, temperature 98.6 F (37 C), temperature source Oral, resp. rate 18, height 5\' 5"  (1.651 m), weight 332 lb (150.594 kg), last menstrual period 07/22/2011, SpO2 99.00%. General appearance: alert, cooperative and no distress Resp: clear to auscultation bilaterally GI: soft, non-tender; bowel sounds normal; no masses,  no organomegaly Incision c/d/i LUQ drain site - minimal drainage RUQ drain - serous drainage  Disposition: 01-Home or Self Care  Discharge Orders    Future Orders Please Complete By Expires   Diet general      Increase activity slowly      May walk up steps      May shower / Bathe      Driving Restrictions      Comments:   Do not drive while taking pain medications   Call MD for:  temperature >100.4      Call MD for:  persistant nausea and vomiting      Call MD for:  severe uncontrolled pain      Call MD for:  redness, tenderness, or signs of infection (pain, swelling, redness, odor or green/yellow discharge around incision site)      Discharge wound care:      Comments:   Dry gauze over drain site on left side Drain on right side Abdominal binder as needed       Medication List       As of 10/29/2011  8:31 AM    STOP taking these medications         HYDROcodone-acetaminophen 5-325 MG per tablet   Commonly known as: NORCO/VICODIN      TAKE these medications         acetaminophen 500 MG tablet   Commonly known as: TYLENOL   Take 500 mg by mouth every 6 (six) hours as needed.      acyclovir 200 MG capsule   Commonly known as: ZOVIRAX   Take 400 mg by mouth 2 (two) times daily.      ALPRAZolam 0.5 MG tablet   Commonly known as: XANAX   Take 0.5 mg by mouth 3 (three) times daily as needed. For anxiety      atenolol 50 MG tablet   Commonly known as: TENORMIN   Take 50 mg by mouth at bedtime.      cycloSPORINE 100 MG capsule   Commonly known as: SANDIMMUNE   Take 200 mg by mouth 2 (two) times daily.      diphenhydrAMINE 25 mg capsule   Commonly known as: BENADRYL   Take 50 mg by mouth at bedtime as needed. For sleep  divalproex 500 MG 24 hr tablet   Commonly known as: DEPAKOTE ER   Take 500 mg by mouth 2 (two) times daily.      doxycycline 100 MG tablet   Commonly known as: VIBRA-TABS   Take 100 mg by mouth 2 (two) times daily.      doxycycline 100 MG tablet   Commonly known as: VIBRA-TABS   Take 1 tablet (100 mg total) by mouth every 12 (twelve) hours.      glimepiride 4 MG tablet   Commonly known as: AMARYL   Take 4 mg by mouth daily before breakfast.      ibuprofen 200 MG tablet   Commonly known as: ADVIL,MOTRIN   Take 800 mg by mouth every 6 (six) hours as needed. For pain      insulin NPH-insulin regular (70-30) 100 UNIT/ML injection   Commonly known as: NOVOLIN 70/30   Inject 35 Units into the skin 2 (two) times daily with a meal.      levothyroxine 112 MCG tablet   Commonly known as: SYNTHROID, LEVOTHROID   Take 112 mcg by mouth daily before breakfast.      metFORMIN 1000 MG tablet   Commonly known as: GLUCOPHAGE   Take 1,000 mg by mouth 2 (two) times daily with a meal.      multivitamin with minerals Tabs   Take 1  tablet by mouth daily.      ondansetron 4 MG tablet   Commonly known as: ZOFRAN   Take 4 mg by mouth every 8 (eight) hours as needed. For nausea      oxyCODONE-acetaminophen 5-325 MG per tablet   Commonly known as: PERCOCET/ROXICET   Take 1-2 tablets by mouth every 4 (four) hours as needed.      Vitamin D3 5000 UNITS Caps   Take 1 capsule by mouth daily.           Follow-up Information    Follow up with Wynona Luna., MD. Schedule an appointment as soon as possible for a visit in 1 week.   Contact information:   [address] [place] [address] [phone]          Signed: Wynona Luna. 10/29/2011, 8:31 AM

## 2011-11-06 ENCOUNTER — Encounter (INDEPENDENT_AMBULATORY_CARE_PROVIDER_SITE_OTHER): Payer: Self-pay | Admitting: Surgery

## 2011-11-06 ENCOUNTER — Ambulatory Visit (INDEPENDENT_AMBULATORY_CARE_PROVIDER_SITE_OTHER): Payer: Medicaid Other | Admitting: Surgery

## 2011-11-06 VITALS — BP 130/82 | HR 84 | Temp 97.0°F | Resp 20 | Ht 65.0 in | Wt 324.6 lb

## 2011-11-06 DIAGNOSIS — IMO0002 Reserved for concepts with insufficient information to code with codable children: Secondary | ICD-10-CM

## 2011-11-06 DIAGNOSIS — K651 Peritoneal abscess: Secondary | ICD-10-CM

## 2011-11-06 NOTE — Progress Notes (Signed)
Status post open ventral hernia repair with mesh on 10/03/11. She required hospitalization again on 10/19/11. She had a large seroma around the mesh that had to be drained. The culture showed methicillin sensitive staph aureus. We performed a diagnostic laparoscopy and removed all the adhesions to the mesh which allowed the seroma to drain. The patient was discharged home on 10/29/11. She is doing much better. She is on doxycycline 100 mg by mouth twice a day. She remains afebrile. Appetite and bowel movements are improving. She still has some sensitivity and tenderness in the lower part of her pannus.  Filed Vitals:   11/06/11 1337  BP: 130/82  Pulse: 84  Temp: 97 F (36.1 C)  Resp: 20    Her drain continues to drain about 60 cc of yellowish fluid each day. This does not appear to be purulent. Her incision is well-healed with no erythema or infection. Her left upper quadrant drain site is completely healed. The drain comes in on her right side and the drain site looks good. The pain is shows no erythema but the skin and subcutaneous tissues feels thickened.  Imp:  Infected seroma; panniculitis Plan:  Continue antibiotics for now. Repeat CT scan next week to determine if the drain is ready to remove.   See the patient in one week.  Wilmon Arms. [name], MD, [facility]  11/06/2011 2:02 PM

## 2011-11-09 ENCOUNTER — Ambulatory Visit
Admission: RE | Admit: 2011-11-09 | Discharge: 2011-11-09 | Disposition: A | Discharge status: Home / Self Care | Payer: Medicaid Other | Source: Ambulatory Visit | Attending: Surgery | Admitting: Surgery

## 2011-11-09 DIAGNOSIS — IMO0002 Reserved for concepts with insufficient information to code with codable children: Secondary | ICD-10-CM

## 2011-11-09 MED ORDER — IOHEXOL 300 MG/ML  SOLN
125.0000 mL | Freq: Once | INTRAMUSCULAR | Status: AC | PRN
  Administered 2011-11-09: 125 mL via INTRAVENOUS

## 2011-11-12 ENCOUNTER — Encounter (HOSPITAL_COMMUNITY): Payer: Self-pay

## 2011-11-12 ENCOUNTER — Other Ambulatory Visit (INDEPENDENT_AMBULATORY_CARE_PROVIDER_SITE_OTHER): Payer: Self-pay | Admitting: Surgery

## 2011-11-12 ENCOUNTER — Ambulatory Visit (HOSPITAL_COMMUNITY)
Admission: RE | Admit: 2011-11-12 | Discharge: 2011-11-12 | Disposition: A | Discharge status: Home / Self Care | Payer: Medicaid Other | Source: Ambulatory Visit | Attending: Surgery | Admitting: Surgery

## 2011-11-12 VITALS — BP 148/87 | HR 86 | Temp 97.9°F | Resp 20 | Ht 65.0 in | Wt 327.0 lb

## 2011-11-12 DIAGNOSIS — K651 Peritoneal abscess: Secondary | ICD-10-CM | POA: Insufficient documentation

## 2011-11-12 DIAGNOSIS — IMO0002 Reserved for concepts with insufficient information to code with codable children: Secondary | ICD-10-CM

## 2011-11-12 LAB — CBC
MCH: 28.1 pg (ref 26.0–34.0)
MCHC: 30.9 g/dL (ref 30.0–36.0)
MCV: 90.8 fL (ref 78.0–100.0)
Platelets: 238 10*3/uL (ref 150–400)
RBC: 2.92 MIL/uL — ABNORMAL LOW (ref 3.87–5.11)
RDW: 15.1 % (ref 11.5–15.5)

## 2011-11-12 MED ORDER — HYDROCODONE-ACETAMINOPHEN 5-325 MG PO TABS
1.0000 | ORAL_TABLET | ORAL | Status: DC | PRN
  Administered 2011-11-12: 2 via ORAL
  Filled 2011-11-12: qty 2

## 2011-11-12 MED ORDER — MIDAZOLAM HCL 2 MG/2ML IJ SOLN
INTRAMUSCULAR | Status: AC
  Filled 2011-11-12: qty 4

## 2011-11-12 MED ORDER — HYDROCODONE-ACETAMINOPHEN 5-325 MG PO TABS
ORAL_TABLET | ORAL | Status: AC
  Filled 2011-11-12: qty 2

## 2011-11-12 MED ORDER — SODIUM CHLORIDE 0.9 % IV SOLN: INTRAVENOUS | Status: DC

## 2011-11-12 MED ORDER — FENTANYL CITRATE 0.05 MG/ML IJ SOLN
INTRAMUSCULAR | Status: AC
  Filled 2011-11-12: qty 4

## 2011-11-12 MED ORDER — FENTANYL CITRATE 0.05 MG/ML IJ SOLN
INTRAMUSCULAR | Status: AC | PRN
  Administered 2011-11-12: 50 ug via INTRAVENOUS
  Administered 2011-11-12: 25 ug via INTRAVENOUS
  Administered 2011-11-12: 50 ug via INTRAVENOUS
  Administered 2011-11-12: 25 ug via INTRAVENOUS

## 2011-11-12 MED ORDER — MIDAZOLAM HCL 2 MG/2ML IJ SOLN
INTRAMUSCULAR | Status: AC | PRN
  Administered 2011-11-12 (×3): 1 mg via INTRAVENOUS
  Administered 2011-11-12: 0.5 mg via INTRAVENOUS

## 2011-11-12 NOTE — Progress Notes (Signed)
PER DR HASSELL OK TO D/C HOME NOW

## 2011-11-12 NOTE — Progress Notes (Signed)
WHEN RECEIVED FROM RADIOLOGY, C/O 5/10 ABD PAIN AND STATES HAS THIS ALL THE TIME; GIVEN PAIN MED

## 2011-11-12 NOTE — Procedures (Signed)
CT abscess drain placement 20ml golden yellow fluid with debris aspirated, sent for GS, C&S No complication No blood loss. See complete dictation in [facility].

## 2011-11-12 NOTE — ED Notes (Signed)
Dressing/drain checked with [facility] on arrival to Short Stay, dressing clean, dry, intact and drain draining clear brown and is insitu.

## 2011-11-12 NOTE — Progress Notes (Signed)
Quick Note:  Please call the patient and let them know that their scan showed an enlarging fluid collection. The drain needs to be replaced. The orders are in EPIC. ______

## 2011-11-12 NOTE — H&P (Signed)
[name] is an 36 y.o. female.   Chief Complaint:  Ventral hernia repair 10/03/11; post op abscess Abscess drain placed 10/19/11 Recent CT shows resolution of this abscess; but development of deeper abdominal abscess Scheduled now for probable removal of existing drain and placement of new deeper abdominal abscess drain HPI: DM; migraines; cervical ca; HTN; GERD; CKD; psoriasis; fatty liver; bipolar; PONV  Past Medical History  Diagnosis Date  . Diabetes mellitus   . Migraine   . Hypoactive thyroid   . Cancer     cervical  . Osteoporosis   . Hypertension   . High cholesterol   . Anginal pain 2011    "stress per Dr Pang"-occ at present from increased stress  . Dysrhythmia     hx MVP  . GERD (gastroesophageal reflux disease)   . Chronic kidney disease     stones in past,  hematuria daily- states hasnt been evaluated due to lack of insurance  . Psoriasis     scattered throughout body- large amount- states increased with stress of surgery  . Fatty liver     with gall bladder sludge by pt history/ no stones  . Stroke     ? subacranoid bleed-  no defits  . Mental disorder     bipolar  . Anxiety   . Sleep apnea      STOP BANG SCORE 6  . Ventral hernia 09/13/2011  . Depression   . Shortness of breath     with exertion/   ? adult onset asthma  . Pneumonia     hx of   . Arthritis   . Anemia     hx of anemia during pregnancy   . PONV (postoperative nausea and vomiting)     also hard to get asleep per pt   . Bipolar disorder     Past Surgical History  Procedure Date  . Cesarean section   . Appendectomy   . Lipoma     total 3  . Neuroplasty / transposition median nerve at carpal tunnel bilateral   . Cervical cancer   . Kidney stone surgery   . Ventral hernia repair 10/03/2011    Procedure: HERNIA REPAIR VENTRAL ADULT;  Surgeon: Wilmon Arms. Tsuei, MD;  Location: [facility] ORS;  Service: General;  Laterality: N/[name];  open ventral hernia repair with mesh  . Laparoscopy  10/22/2011    Procedure: LAPAROSCOPY DIAGNOSTIC;  Surgeon: Wilmon Arms. [name], MD;  Location: [facility] OR;  Service: General;  Laterality: N/[name];    Family History  Problem Relation Age of Onset  . Hypertension Mother   . Glaucoma Mother   . Hypertension Father   . Cancer Father     precancerious polyps  . Cancer Maternal Uncle     colon   Social History:  reports that she has never smoked. She has never used smokeless tobacco. She reports that she does not drink alcohol or use illicit drugs.  Allergies:  Allergies  Allergen Reactions  . Cephalexin Anaphylaxis    But can tolerate Ceftin and Amoxicillin per patient  . Augmentin (Amoxicillin-Pot Clavulanate)     Rash, eye swelling  . Latex Itching  . Lisinopril Cough  . Statins Other (See Comments)    Back pain   . Tea Rash     (Not in [name] hospital admission)  No results found for this or any previous visit (from the past 48 hour(s)). No results found.  Review of Systems  Constitutional: Positive for fever.  Cardiovascular: Negative for chest pain.  Gastrointestinal: Positive for nausea and abdominal pain.  Musculoskeletal: Positive for back pain.    Last menstrual period 11/09/2011. Physical Exam  Constitutional: She is oriented to person, place, and time.  Cardiovascular: Normal rate, regular rhythm and normal heart sounds.   No murmur heard. Respiratory: Effort normal and breath sounds normal. She has no wheezes.  GI: Bowel sounds are normal. She exhibits distension. There is tenderness.  Musculoskeletal: Normal range of motion.  Neurological: She is alert and oriented to person, place, and time.  Psychiatric: She has [name] normal mood and affect. Her behavior is normal. Judgment and thought content normal.     Assessment/Plan Ventral hernia repair 10/03/11 Post op abd abscess drain placement 9/27 Recent CT shows resolution of this abscess but new deeper abd abscess development Scheduled now for removal of existing drain  and placement of new abscess drain placement Pt aware of procedure benefits and risks and agreeable to proceed Consent signed and in chart  [name] 11/12/2011, 1:02 PM

## 2011-11-13 ENCOUNTER — Telehealth (INDEPENDENT_AMBULATORY_CARE_PROVIDER_SITE_OTHER): Payer: Self-pay

## 2011-11-13 NOTE — Telephone Encounter (Signed)
The pt called stating her drain was removed and replaced yesterday.  She wanted to know if she needs to come tomorrow.  I told her the appt was already cancelled.  Please let her know what she needs to do about following up with Dr [name].

## 2011-11-14 ENCOUNTER — Encounter (INDEPENDENT_AMBULATORY_CARE_PROVIDER_SITE_OTHER): Payer: Medicaid Other | Admitting: Surgery

## 2011-11-15 ENCOUNTER — Telehealth (INDEPENDENT_AMBULATORY_CARE_PROVIDER_SITE_OTHER): Payer: Self-pay | Admitting: General Surgery

## 2011-11-15 LAB — CULTURE, ROUTINE-ABSCESS: Culture: NO GROWTH

## 2011-11-15 NOTE — Telephone Encounter (Signed)
Called [name] back to check on her and told her that she can have a bath put to cover the bag up or take a bird bath and I told her to keep up with the amount of drainage coming out of the bag and keep up with her temp. She is doing stool softeners, she stated that she feeling well and I reminded her of her appt on the 10-30 with Dr [name], but to call if she has any problems

## 2011-11-15 NOTE — Telephone Encounter (Signed)
Patient calling status post new drain placement. She did have a JP drain- now she states she has a drain with a bag and wants to be sure it is still okay to shower with this type of drain in. She also complains of a new type of "deep pulsating" pain at drain site. Drain site looks good. No redness or drainage on skin. No fevers. She is having some nausea controlled by zofran. No vomiting. She is having some trouble moving her bowels but is getting this back to normal with the use of stool softeners. Please advise.

## 2011-11-21 ENCOUNTER — Ambulatory Visit (INDEPENDENT_AMBULATORY_CARE_PROVIDER_SITE_OTHER): Payer: Medicaid Other | Admitting: Surgery

## 2011-11-21 ENCOUNTER — Encounter (INDEPENDENT_AMBULATORY_CARE_PROVIDER_SITE_OTHER): Payer: Self-pay | Admitting: Surgery

## 2011-11-21 VITALS — BP 96/58 | HR 87 | Temp 98.1°F | Ht 65.0 in | Wt 303.8 lb

## 2011-11-21 DIAGNOSIS — K439 Ventral hernia without obstruction or gangrene: Secondary | ICD-10-CM

## 2011-11-21 NOTE — Progress Notes (Signed)
The patient comes in today for a drain check. She is feeling well. She has been more active and has been out and about. She had a CT scan which showed that the drain in the subcutaneous tissues was ready for removal. There was an enlarging fluid collection deep to the fascia of the anterior to the mesh. This was percutaneously drained on 11/12/11. This drain has put out about 30-50 mL's of serous drainage each day. Appetite and bowel movements seem to be normal. Her abdomen is much less tender. She remains on the doxycycline. Overall she feels much better.  Filed Vitals:   11/21/11 1322  BP: 96/58  Pulse: 87  Temp: 98.1 F (36.7 C)   CT scan 11/09/11  *RADIOLOGY REPORT*  Clinical Data: Abdominal wall seroma. Right lower quadrant pain.  Low grade fever.  CT ABDOMEN AND PELVIS WITH CONTRAST  Technique: Multidetector CT imaging of the abdomen and pelvis was  performed following the standard protocol during bolus  administration of intravenous contrast.  Contrast: OMNIPAQUE IOHEXOL 300 MG/ML SOLN  Comparison: 10/21/2011  Findings: The drain is seen in the subcutaneous fat in the midline.  The adjacent fluid collection in the subcutaneous fat has resolved.  However, the fluid collection deep to the anterior abdominal wall  has increased in size and now measures 16.8 x 8.1 x 4.9 cm and the  rim enhances consistent with infection.  There is suggestion of a 2.3 x 1.1 x 2.3 cm subcutaneous abscess  just anterior to the abdominal wall on image #48 of series 102 but  this is not definitive.  There is persistent hepatomegaly with diffuse hepatic steatosis.  The spleen, pancreas, adrenal glands, and kidneys are normal.  Gallbladder appears normal. The bowel is normal. Uterus and  ovaries are normal. No free fluid in the abdomen.  IMPRESSION:  1. The majority of the fluid in the subcutaneous fat of the  anterior abdomen has resolved with no residual fluid around the  drainage catheter.    2. Increased fluid pocket deep to the anterior abdominal wall in  the midline with enhancing margin. I suspect this is an abscess.  3. Possible tiny subcutaneous abscess just to the left of midline  on image #48 of series 102.  Original Report Authenticated By: Gwynn Burly, M.D.   Abscess drainage 11/13/11 RADIOLOGY REPORT*  Clinical data: Abdominal ventral hernia repair, with mesh  infection, status post drainage catheter placement. CT  08/31/2017 showed resolution of the fluid collection around the  percutaneous drainage catheter, with enlargement of fluid  collection deep to the mesh.  CT-GUIDED ABSCESS DRAINAGE CATHETER PLACEMENT  Technique and findings: The procedure, risks (including but not  limited to bleeding, infection, organ damage ), benefits, and  alternatives were explained to the patient. Questions regarding  the procedure were encouraged and answered. The patient  understands and consents to the procedure.  Intravenous Fentanyl and Versed were administered as conscious  sedation during continuous cardiorespiratory monitoring by the  radiology RN, with a total moderate sedation time of 20 minutes.  Select axial scans through the mid abdomen were obtained and an  appropriate skin entry site was localized. Operator donned sterile  gloves and mask.  Site was marked, prepped with Betadine, draped in usual sterile  fashion, infiltrated locally with 1% lidocaine. Under CT  fluoroscopic guidance, an 18 gauge trocar needle was advanced into  the enlarging anterior collection. An Amplatz guide wire advanced  easily within the collection, its position confirmed on  CT  fluoroscopy. The tract was dilated to facilitate placement of a 12-  French pigtail catheter, formed centrally within the collection.  Catheter position confirmed on CT. The catheter was secured  externally with O-Prolene suture and Statlock device. 20 ml of  amber fluid with debris were aspirated, sent for  Gram stain,  culture and sensitivity. The catheter was then secured to gravity  drain bag and covered with sterile dressing. The previously placed  more superficial drain catheter was cut and removed in its  entirety without complication. The patient tolerated the procedure  well.  IMPRESSION:  1. Technically successful drainage catheter placement into  enlarging fluid collection adjacent to the ventral hernia mesh.  2. Removal of superficial drainage catheter without complication.  Original Report Authenticated By: Osa Craver, M.D.   Cultures remain negative for bacterial growth.   The patient is wearing an abdominal binder. The upper end of the binder is sitting across her midline incision. There is some erythema and thickening of the skin of her midline incision. Prior to this examination this incision has always looked clean and free of any sign of infection. The patient states that this did not appear red at all yesterday or even this morning. There is question whether the upper end of the abdominal binder is irritating the skin. She is minimally tender here. The drain coming out on the right side has some fibrinous strands within the tubing but seems to be draining some clear serous fluid. No significant abdominal tenderness. Active bowel sounds.  Impression: Status post open ventral hernia repair with mesh on 10/03/11. This was complicated by an infected seroma. She remains on doxycycline for methicillin sensitive staph. Overall she seems to be doing well but I have some concern about the appearance of her incision. Continue antibiotics for now. Recheck in a few days. She may need to have the incision opened up if there is a subcutaneous infection. We'll continue the deep drain for now.  [name]. [name], MD, [facility]  11/21/2011 6:16 PM

## 2011-11-21 NOTE — Patient Instructions (Signed)
Labs - CBC, BMET

## 2011-11-28 ENCOUNTER — Ambulatory Visit (INDEPENDENT_AMBULATORY_CARE_PROVIDER_SITE_OTHER): Payer: Medicaid Other | Admitting: Surgery

## 2011-11-28 ENCOUNTER — Encounter (INDEPENDENT_AMBULATORY_CARE_PROVIDER_SITE_OTHER): Payer: Self-pay | Admitting: Surgery

## 2011-11-28 ENCOUNTER — Telehealth (INDEPENDENT_AMBULATORY_CARE_PROVIDER_SITE_OTHER): Payer: Self-pay | Admitting: General Surgery

## 2011-11-28 VITALS — BP 130/64 | HR 84 | Temp 99.8°F | Resp 16 | Ht 65.5 in | Wt 295.8 lb

## 2011-11-28 DIAGNOSIS — Z9889 Other specified postprocedural states: Secondary | ICD-10-CM

## 2011-11-28 DIAGNOSIS — Z8719 Personal history of other diseases of the digestive system: Secondary | ICD-10-CM

## 2011-11-28 NOTE — Progress Notes (Signed)
The patient comes in for a followup visit. She has had minimal output from her drain. She has been having some mild nausea and temperatures around 99. She continues to take her antibiotics. She has had some drainage from her incision. She had labs last week which showed a white count of 10.0 hemoglobin 8.6 platelet count 418 electrolytes within normal limits.  Filed Vitals:   11/28/11 1534  BP: 130/64  Pulse: 84  Temp: 99.8 F (37.7 C)  Resp: 16   Her drain has some thin serous fluid. The drain site looks good. Her midline incision has some mild fluctuance with in the midportion of the incision. There is minimal surrounding erythema. It actually looks less erythematous than it did last week.  We removed the drain without any difficulty. The midline incision was prepped with Betadine and anesthetized with 1% lidocaine. I opened the central fluctuant area. A moderate amount of what appeared to be necrotic fat was drained. The wound only goes about 2 cm deep. I did not see any purulent drainage.  The wound was packed with 1 inch Nu-Gauze.  She was switch to wet to dry dressings tomorrow.  Continue antibiotics, Zofran PRN.  Recheck 1 week.  Wilmon Arms. [name], MD, [facility]  11/28/2011 5:05 PM

## 2011-11-28 NOTE — Telephone Encounter (Signed)
Pt called to report her incision has opened slightly and is draining (serous) fluid.  Reassured her and instructed to cleanse with soap and water, rinse, pat dry and cover with gauze.  Keep appt this afternoon with MT.  She understands.

## 2011-12-07 ENCOUNTER — Ambulatory Visit (INDEPENDENT_AMBULATORY_CARE_PROVIDER_SITE_OTHER): Payer: Medicaid Other | Admitting: Surgery

## 2011-12-07 ENCOUNTER — Encounter (INDEPENDENT_AMBULATORY_CARE_PROVIDER_SITE_OTHER): Payer: Self-pay | Admitting: Surgery

## 2011-12-07 VITALS — BP 126/83 | HR 84 | Temp 97.8°F | Resp 16 | Ht 65.5 in | Wt 286.4 lb

## 2011-12-07 DIAGNOSIS — K439 Ventral hernia without obstruction or gangrene: Secondary | ICD-10-CM

## 2011-12-07 NOTE — Progress Notes (Signed)
The patient comes in for another postoperative visit. She is feeling quite well. Her abdominal soreness is almost completely gone. Appetite is improving. She has some mild nausea which is likely secondary to her doxycycline. Her energy level is much improved. Her white count is normal. Her incision is almost completely healed. There is no induration. There is a 2 cm area of open granulation tissue with minimal drainage. No surrounding erythema. The remainder of her incision is well healed. She may treat this area with Neosporin and a dry dressing until completely healed. She may slowly begin increasing her level of activity. Recheck in 3 weeks.  Wilmon Arms. [name], MD, [facility]  12/07/2011 11:55 AM

## 2011-12-11 ENCOUNTER — Telehealth (INDEPENDENT_AMBULATORY_CARE_PROVIDER_SITE_OTHER): Payer: Self-pay | Admitting: General Surgery

## 2011-12-11 NOTE — Telephone Encounter (Signed)
A Rx refill came over the fax from [facility] and I sent Dr [name] a staff message to ask for any refill and he wanted patient to have one more month and I called in the RX and spoke to [place] and gave the orders over the phone to give patient one more month. [facility] call back number is [phone]

## 2011-12-12 ENCOUNTER — Encounter (INDEPENDENT_AMBULATORY_CARE_PROVIDER_SITE_OTHER): Payer: Self-pay

## 2011-12-22 ENCOUNTER — Other Ambulatory Visit (INDEPENDENT_AMBULATORY_CARE_PROVIDER_SITE_OTHER): Payer: Self-pay | Admitting: Surgery

## 2011-12-24 NOTE — Telephone Encounter (Signed)
This Rx went to Dr Luisa Hart also, and I did not want to refuse it . I know it is a copy

## 2011-12-24 NOTE — Telephone Encounter (Signed)
Can this patient have this 3 Rx

## 2011-12-27 ENCOUNTER — Telehealth (INDEPENDENT_AMBULATORY_CARE_PROVIDER_SITE_OTHER): Payer: Self-pay | Admitting: General Surgery

## 2011-12-27 NOTE — Telephone Encounter (Signed)
Pt called with question about a burning/ stinging pain at the site of her ventral hernia repair (done 10/22/11.)  Explained to her how the scar tissue is evolving and maturing, and it will occasionally cause the symptoms she is described.  Can use Ibuprofen or Tylenol for pain control.  She understands.

## 2011-12-31 ENCOUNTER — Ambulatory Visit (INDEPENDENT_AMBULATORY_CARE_PROVIDER_SITE_OTHER): Payer: Medicaid Other | Admitting: Surgery

## 2011-12-31 ENCOUNTER — Encounter (INDEPENDENT_AMBULATORY_CARE_PROVIDER_SITE_OTHER): Payer: Self-pay | Admitting: Surgery

## 2011-12-31 VITALS — BP 143/82 | HR 82 | Temp 97.7°F | Resp 16 | Ht 65.5 in | Wt 281.4 lb

## 2011-12-31 DIAGNOSIS — K439 Ventral hernia without obstruction or gangrene: Secondary | ICD-10-CM

## 2011-12-31 NOTE — Progress Notes (Signed)
The patient is here for her final recheck after her open ventral hernia repair with mesh on 10/03/11. This was complicated by an infected seroma. Patient doing quite well. She has finished her antibiotics. She continues to lose weight and has lost about 70 pounds since for surgery. She feels much better. As she is more active she gets occasional stinging and burning in her muscles along side of her incision. There is no sign of recurrent hernia. No sign of infection or induration around her incision. She is nontender to palpation. Likely these pulling sensations are from the sutures in the muscle. She may wear her abdominal binder as needed. She may resume flexibility. Followup with [place] as needed.  [name]. Amanda Skains, MD, [facility] Surgery  12/31/2011 1:50 PM

## 2012-01-16 ENCOUNTER — Encounter (INDEPENDENT_AMBULATORY_CARE_PROVIDER_SITE_OTHER): Payer: Self-pay | Admitting: Surgery

## 2012-01-21 ENCOUNTER — Telehealth (INDEPENDENT_AMBULATORY_CARE_PROVIDER_SITE_OTHER): Payer: Self-pay | Admitting: General Surgery

## 2012-01-21 NOTE — Telephone Encounter (Signed)
Nursing office did receive msg from my chart note.  I could not reach pt at phone #s given.I lmom for pt to call nursing office back so we could access her symptoms and review this with our on call MD. I have held note awaiting pt to call back.

## 2012-01-21 NOTE — Telephone Encounter (Signed)
Pt called in and explained that she left for vacation and she had a hue of redness around her surgical site where she had a hernia repaired.  She also explained that there was some dimpling.  She had some old doxycycline that she started taking and now the redness is starting to fade.  She wanted to know if she should continue taking the doxy and/or if she needed to be seen for an evaluation.  I explained that Dr. [name] is out of the office but that as soon as he returns, his nurse would call her if they thought she needed to be seen.

## 2012-01-24 ENCOUNTER — Telehealth (INDEPENDENT_AMBULATORY_CARE_PROVIDER_SITE_OTHER): Payer: Self-pay | Admitting: General Surgery

## 2012-01-24 NOTE — Telephone Encounter (Signed)
Called patient this morning to check on her and to let her know that I was going to call in Rx for Doxycycline 100 mg BID # 60 with 2 refill. Patient stated that she had some left over from the last Rx and has been taking the med and she stated that the dimpling has went away. [facility] [address] and patient has an follow apt on 02-01-12 to come in to see Dr [name]. And I also told patient if this area gets worse to call up here and speak to [facility]

## 2012-01-24 NOTE — Telephone Encounter (Signed)
She may take the Doxy 100 mg po BID.  She may need a refill called in - #60 2 refills  I should see her in the next 1-2 weeks

## 2012-02-01 ENCOUNTER — Ambulatory Visit (INDEPENDENT_AMBULATORY_CARE_PROVIDER_SITE_OTHER): Payer: Medicaid Other | Admitting: Surgery

## 2012-02-01 ENCOUNTER — Encounter (INDEPENDENT_AMBULATORY_CARE_PROVIDER_SITE_OTHER): Payer: Self-pay | Admitting: Surgery

## 2012-02-01 VITALS — BP 130/72 | HR 72 | Temp 97.4°F | Resp 16 | Ht 66.0 in | Wt 283.0 lb

## 2012-02-01 DIAGNOSIS — K439 Ventral hernia without obstruction or gangrene: Secondary | ICD-10-CM

## 2012-02-01 NOTE — Progress Notes (Signed)
The patient called last week with some induration and erythema in the left upper part of her hernia repair. The skin became thickened. We started her back on doxycycline and the erythema has resolved. The thickening has decreased. She still has some soreness located in this area. She seems to be tolerating the doxycycline fairly well with a little bit of when necessary Zofran.  On examination the patient is afebrile with a temperature of 97.2. There is no visible erythema of the skin. There is some slight firmness in the left upper quadrant. This is below one of her surgical drain sites. All of her incisions are well-healed. No sign of recurrent hernia.  Status post open ventral hernia repair with mesh complicated by a postoperative infected seroma. The seroma was trapped behind the muscle but anterior to the mesh. She seems to have improved with antibiotic therapy. She may have a small residual pocket of seroma fluid that may have caused this most recent infection. We will continue doxycycline for at least 3 more weeks and will reevaluate her at that time. If she continues to have problems or if the induration gets worse we will obtain another CT scan to make sure that she does not need a another percutaneous drain.  Wilmon Arms. [name], MD, [facility] Surgery  02/01/2012 9:47 AM

## 2012-02-25 ENCOUNTER — Telehealth (INDEPENDENT_AMBULATORY_CARE_PROVIDER_SITE_OTHER): Payer: Self-pay | Admitting: General Surgery

## 2012-02-25 NOTE — Telephone Encounter (Signed)
Called patient back and spoke to her that I talked to Dr [name] about her having a CT. I told her that I talked to Dr [name] about patient did not have any fever or drainage or redness around her surgery site. She just felt a know when she pressed down on the area. And patient is ok to wait to see Dr [name] on 02-27-12 to reck everything

## 2012-02-27 ENCOUNTER — Ambulatory Visit (INDEPENDENT_AMBULATORY_CARE_PROVIDER_SITE_OTHER): Payer: Medicaid Other | Admitting: Surgery

## 2012-02-27 ENCOUNTER — Encounter (INDEPENDENT_AMBULATORY_CARE_PROVIDER_SITE_OTHER): Payer: Self-pay | Admitting: Surgery

## 2012-02-27 VITALS — BP 146/74 | HR 64 | Temp 97.4°F | Resp 12 | Ht 65.0 in | Wt 285.6 lb

## 2012-02-27 DIAGNOSIS — K439 Ventral hernia without obstruction or gangrene: Secondary | ICD-10-CM

## 2012-02-27 NOTE — Progress Notes (Signed)
The patient comes in for another wound check. She is feeling much better. All the induration around her abdominal incision has resolved. There's no erythema. She had some blood work with her primary care physician last week. This showed a normal white blood cell count of 8 with a hemoglobin of 10.4. Her midline incision is healing well. There is no induration. Near the left upper quadrant drain site there is a small knot of hard subcutaneous scar tissue. However there is no erythema in this area. The previous induration has resolved. I told her that she could stop her doxycycline. She may resume full activity. She will notify us if any of induration or erythema returned. Otherwise followup when necessary  Wilmon Arms. [name], MD, [facility] Surgery  02/27/2012 10:53 AM

## 2012-03-08 ENCOUNTER — Other Ambulatory Visit: Payer: Self-pay

## 2012-04-04 ENCOUNTER — Encounter (INDEPENDENT_AMBULATORY_CARE_PROVIDER_SITE_OTHER): Payer: Self-pay

## 2012-08-30 ENCOUNTER — Encounter (INDEPENDENT_AMBULATORY_CARE_PROVIDER_SITE_OTHER): Payer: Self-pay | Admitting: Surgery

## 2012-09-01 ENCOUNTER — Other Ambulatory Visit (INDEPENDENT_AMBULATORY_CARE_PROVIDER_SITE_OTHER): Payer: Self-pay | Admitting: Surgery

## 2012-09-04 ENCOUNTER — Telehealth (INDEPENDENT_AMBULATORY_CARE_PROVIDER_SITE_OTHER): Payer: Self-pay | Admitting: General Surgery

## 2012-09-04 NOTE — Telephone Encounter (Signed)
LMOM for patient to call back up here and ask for [facility]

## 2012-09-23 ENCOUNTER — Encounter (INDEPENDENT_AMBULATORY_CARE_PROVIDER_SITE_OTHER): Payer: Self-pay | Admitting: Surgery

## 2012-09-23 ENCOUNTER — Ambulatory Visit (INDEPENDENT_AMBULATORY_CARE_PROVIDER_SITE_OTHER): Payer: Medicaid Other | Admitting: Surgery

## 2012-09-23 VITALS — BP 146/90 | HR 80 | Temp 97.3°F | Resp 16 | Ht 66.0 in | Wt 297.2 lb

## 2012-09-23 DIAGNOSIS — K439 Ventral hernia without obstruction or gangrene: Secondary | ICD-10-CM

## 2012-09-23 NOTE — Progress Notes (Signed)
Patient ID: [name], female   DOB: Aug 06, 1975, 37 y.o.   MRN: [id number]  Chief Complaint  Patient presents with  . Follow-up    re-eval ventral hernia    HPI [name] is a 37 y.o. female.  HPI This patient underwent open ventral hernia repair with mesh for a large supraumbilical ventral hernia on 10/03/11. She had a large infected seroma around the mesh that had to be drained. She had to have a diagnostic laparoscopy with drainage of the seroma. She was discharged home on 10/29/11. Her last visit with [place] was in February of this year.  Recently the patient had developed some sepsis from a severe kidney infection. She was hospitalized in [place]. CT scan at that time showed the left kidney inflammation but also incidentally noted a small recurrent ventral hernia to the left just above her umbilicus. This contains only some fat. The patient denies any symptoms in this area. Occasionally she gets some twinges of pain in her left lower quadrant but denies any swelling. No digestive symptoms. She feels that she is slowly recovering from her recent bout of urosepsis.   Past Medical History  Diagnosis Date  . Diabetes mellitus   . Migraine   . Hypoactive thyroid   . Cancer     cervical  . Osteoporosis   . Hypertension   . High cholesterol   . Anginal pain 2011    "stress per Dr Pang"-occ at present from increased stress  . Dysrhythmia     hx MVP  . GERD (gastroesophageal reflux disease)   . Chronic kidney disease     stones in past,  hematuria daily- states hasnt been evaluated due to lack of insurance  . Psoriasis     scattered throughout body- large amount- states increased with stress of surgery  . Fatty liver     with gall bladder sludge by pt history/ no stones  . Stroke     ? subacranoid bleed-  no defits  . Mental disorder     bipolar  . Anxiety   . Sleep apnea      STOP BANG SCORE 6  . Ventral hernia 09/13/2011  . Depression   . Shortness of breath      with exertion/   ? adult onset asthma  . Pneumonia     hx of   . Arthritis   . Anemia     hx of anemia during pregnancy   . PONV (postoperative nausea and vomiting)     also hard to get asleep per pt   . Bipolar disorder     Past Surgical History  Procedure Laterality Date  . Cesarean section    . Appendectomy    . Lipoma      total 3  . Neuroplasty / transposition median nerve at carpal tunnel bilateral    . Cervical cancer    . Kidney stone surgery    . Ventral hernia repair  10/03/2011    Procedure: HERNIA REPAIR VENTRAL ADULT;  Surgeon: Wilmon Arms. Riad Wagley, MD;  Location: [facility] ORS;  Service: General;  Laterality: N/A;  open ventral hernia repair with mesh  . Laparoscopy  10/22/2011    Procedure: LAPAROSCOPY DIAGNOSTIC;  Surgeon: Wilmon Arms. [name], MD;  Location: [facility] OR;  Service: General;  Laterality: N/A;  . Hernia repair      Family History  Problem Relation Age of Onset  . Hypertension Mother   . Glaucoma Mother   . Hypertension Father   .  Cancer Father     precancerious polyps  . Cancer Maternal Uncle     colon    Social History History  Substance Use Topics  . Smoking status: Never Smoker   . Smokeless tobacco: Never Used  . Alcohol Use: No    Allergies  Allergen Reactions  . Cephalexin Anaphylaxis and Shortness Of Breath    But can tolerate Ceftin and Amoxicillin per patient  . Cephalexin Anaphylaxis    But can tolerate Ceftin and Amoxicillin per patient  . Latex Itching  . Lisinopril Cough and Other (See Comments)    Cough  . Simvastatin Other (See Comments)    Back pain  . Statins Other (See Comments) and Nausea And Vomiting    Back pain Back pain   . Augmentin [Amoxicillin-Pot Clavulanate] Rash    Other reaction(s): Swelling Rash, eye swelling Rash, eye swelling  . Tea Rash    Current Outpatient Prescriptions  Medication Sig Dispense Refill  . acetaminophen (TYLENOL) 500 MG tablet 500 mg. Take 500 mg by mouth every 6 (six) hours as needed.       Marland Kitchen acyclovir (ZOVIRAX) 200 MG capsule Take 400 mg by mouth 2 (two) times daily.       Marland Kitchen acyclovir (ZOVIRAX) 200 MG capsule TAKE 2 CAPSULES (400 MG TOTAL) BY MOUTH 2 (TWO) TIMES DAILY.      Marland Kitchen ALPRAZolam (XANAX) 0.5 MG tablet Take 1 mg by mouth 3 (three) times daily as needed. For anxiety      . Armodafinil (NUVIGIL) 250 MG tablet 250 mg. Take 250 mg by mouth daily.      Marland Kitchen aspirin 81 MG EC tablet 81 mg. Take 81 mg by mouth daily.      . Biotin 800 MCG TABS Take by mouth.      . Cholecalciferol (VITAMIN D) 1000 UNITS capsule Take 5,000 Units by mouth daily.      . Cholecalciferol (VITAMIN D3) 5000 UNITS CAPS Take 1 capsule by mouth daily.      . ciclopirox (PENLAC) 8 % solution Apply topically at bedtime. Apply daily on nail and surrounding skin over previous coat. After 7 days remove with alcohol and repeat cycle.      . cycloSPORINE (SANDIMMUNE) 100 MG capsule Take 100 mg by mouth every other day.       . diltiazem (CARDIZEM SR) 120 MG 12 hr capsule 120 mg. Take 1 capsule (120 mg total) by mouth 2 (two) times daily.      Marland Kitchen doxycycline (VIBRA-TABS) 100 MG tablet TAKE 1 TABLET (100 MG TOTAL) BY MOUTH EVERY 12 (TWELVE) HOURS.  42 tablet  0  . ferrous sulfate 325 (65 FE) MG EC tablet 325 mg. Take 1 tablet (325 mg total) by mouth 2 (two) times daily.      Marland Kitchen gabapentin (NEURONTIN) 300 MG capsule TAKE ONE CAPSULE AT BEDTIME      . glucose blood (ACCU-CHEK AVIVA) test strip Use as directed      . HYDROcodone-acetaminophen (NORCO) 10-325 MG per tablet Take 2 tablets by mouth every 4 (four) hours as needed. As needed for pain.      Marland Kitchen ibuprofen (ADVIL,MOTRIN) 200 MG tablet Take 800 mg by mouth every 6 (six) hours as needed. For pain      . levothyroxine (SYNTHROID, LEVOTHROID) 112 MCG tablet Take 112 mcg by mouth daily before breakfast.       . levothyroxine (SYNTHROID, LEVOTHROID) 125 MCG tablet 125 mcg. Take 125 mcg by mouth daily.      Marland Kitchen  metFORMIN (GLUCOPHAGE) 1000 MG tablet Take 1,000 mg by mouth 2  (two) times daily with a meal.      . metFORMIN (GLUCOPHAGE) 1000 MG tablet TAKE 1 TABLET TWICE A DAY WITH FOOD      . nitrofurantoin, macrocrystal-monohydrate, (MACROBID) 100 MG capsule Take 100 mg by mouth 2 (two) times daily.      . ondansetron (ZOFRAN) 4 MG tablet Take 4 mg by mouth every 8 (eight) hours as needed. For nausea      . oxybutynin (DITROPAN) 5 MG tablet 5 mg. Take 5 mg by mouth 3 (three) times a day.      . Prenat-FeFum-FePo-FA-Omega 3 (CONCEPT DHA) 53.5-38-1 MG CAPS 1 tablet. Take 1 tablet by mouth daily.      . Prenatal Vit-Fe Fumarate-FA (MULTIVITAMIN-PRENATAL) 27-0.8 MG TABS Take 1 tablet by mouth daily.      . promethazine (PHENERGAN) 25 MG tablet TAKE 1 TAB BY MOUTH 30 MIN PRIOR TO IV INFUSION, THEN TAKE 1 TAB EVERY 6 HOURS AS NEEDED FOR NAUSEA      . QUEtiapine (SEROQUEL) 25 MG tablet TAKE 1 TABLET AT BEDTIME FOR 1 WEEK THEN INCREASE TO 2 TABLETS AT BEDTIME THEREAFTER      . sertraline (ZOLOFT) 100 MG tablet 100 mg. Take 100 mg by mouth daily.      . sertraline (ZOLOFT) 50 MG tablet Take 50 mg by mouth daily.      . Sodium Oxybate (XYREM) 500 MG/ML SOLN        No current facility-administered medications for this visit.   Facility-Administered Medications Ordered in Other Visits  Medication Dose Route Frequency Provider Last Rate Last Dose  . midazolam (VERSED) 5 MG/5ML injection    PRN Clydene Pugh [place], CRNA   2 mg at 08/06/11 4540    Review of Systems Review of Systems  Constitutional: Negative for fever, chills and unexpected weight change.  HENT: Negative for hearing loss, congestion, sore throat, trouble swallowing and voice change.   Eyes: Negative for visual disturbance.  Respiratory: Negative for cough and wheezing.   Cardiovascular: Negative for chest pain, palpitations and leg swelling.  Gastrointestinal: Negative for nausea, vomiting, abdominal pain, diarrhea, constipation, blood in stool, abdominal distention and anal bleeding.  Genitourinary:  Negative for hematuria, vaginal bleeding and difficulty urinating.  Musculoskeletal: Negative for arthralgias.  Skin: Negative for rash and wound.  Neurological: Negative for seizures, syncope and headaches.  Hematological: Negative for adenopathy. Does not bruise/bleed easily.  Psychiatric/Behavioral: Negative for confusion.  She has lost 30 lbs in the last year.  Blood pressure 146/90, pulse 80, temperature 97.3 F (36.3 C), temperature source Oral, resp. rate 16, height 5\' 6"  (1.676 m), weight 297 lb 3.2 oz (134.809 kg).  Physical Exam Physical Exam WDWN in NAD HEENT:  EOMI, sclera anicteric Neck:  No masses, no thyromegaly Lungs:  CTA bilaterally; normal respiratory effort CV:  Regular rate and rhythm; no murmurs Abd:  +bowel sounds, soft, well-healed midline incision; no visible signs of recurrent hernia To the left of the lower end of the incision, there is a slight bulge with Valsalva maneuver. Ext:  Well-perfused; no edema Skin:  Warm, dry; no sign of jaundice  Data Reviewed CT scan from [facility] - 08/20/12 and 09/01/12 Small fat-containing ventral hernia just above and left of the umbilicus  Assessment    Small recurrent ventral hernia - containing only fat.  Asymptomatic.     Plan    Would not recommend surgical treatment at this time.  We will follow the patient to see if this recurrent hernia enlarges. It is likely that the infected seroma caused some disruption of the mesh and some fat herniated around the side of the mesh. However the patient is just now recovering from her recent bout of urosepsis.  I would like to see her to back to full health. She remains asymptomatic we may just watch this area. However if it gets larger then we will need to pursue surgical treatment. We will recheck her in 4 months.       Renaye Janicki K. 09/23/2012, 11:28 AM

## 2012-10-17 ENCOUNTER — Encounter (INDEPENDENT_AMBULATORY_CARE_PROVIDER_SITE_OTHER): Payer: Self-pay

## 2012-10-22 HISTORY — PX: EYE SURGERY: SHX253

## 2012-11-27 ENCOUNTER — Other Ambulatory Visit (INDEPENDENT_AMBULATORY_CARE_PROVIDER_SITE_OTHER): Payer: Self-pay | Admitting: Surgery

## 2012-11-27 ENCOUNTER — Other Ambulatory Visit: Payer: Self-pay

## 2012-11-27 ENCOUNTER — Encounter (INDEPENDENT_AMBULATORY_CARE_PROVIDER_SITE_OTHER): Payer: Self-pay | Admitting: Surgery

## 2012-11-27 ENCOUNTER — Telehealth (INDEPENDENT_AMBULATORY_CARE_PROVIDER_SITE_OTHER): Payer: Self-pay | Admitting: General Surgery

## 2012-11-27 DIAGNOSIS — R109 Unspecified abdominal pain: Secondary | ICD-10-CM

## 2012-11-27 DIAGNOSIS — K439 Ventral hernia without obstruction or gangrene: Secondary | ICD-10-CM

## 2012-11-27 NOTE — Telephone Encounter (Signed)
Spoke with pt and informed her that we are waiting for medicaid to authorize her CT scan and because of this we had to re-schedule her appt for 12/03/12 at 11:30.  Instructions were re-given and patient understood.

## 2012-11-27 NOTE — Telephone Encounter (Signed)
LMOM asking pt to return my call in regards to setting up her CT scan.

## 2012-11-27 NOTE — Telephone Encounter (Signed)
Patient called back and I informed her that I would schedule her CT scan.  She informed me that she still has medicaid for her primary coverage.  Informed her that I would leave her Ct instructions with the contrast instructions at our front desk for her to pick up at some point today.

## 2012-11-28 ENCOUNTER — Other Ambulatory Visit: Payer: Self-pay

## 2012-11-28 ENCOUNTER — Telehealth (INDEPENDENT_AMBULATORY_CARE_PROVIDER_SITE_OTHER): Payer: Self-pay | Admitting: *Deleted

## 2012-11-28 NOTE — Telephone Encounter (Signed)
LMOM to inform pt that her CT abdomen/pelvis has been pre authorized by her insurance company.  Instructed pt if she has any questions to give [place] a call.

## 2012-12-03 ENCOUNTER — Ambulatory Visit
Admission: RE | Admit: 2012-12-03 | Discharge: 2012-12-03 | Disposition: A | Discharge status: Home / Self Care | Payer: Medicaid Other | Source: Ambulatory Visit | Attending: Surgery | Admitting: Surgery

## 2012-12-03 ENCOUNTER — Telehealth (INDEPENDENT_AMBULATORY_CARE_PROVIDER_SITE_OTHER): Payer: Self-pay | Admitting: General Surgery

## 2012-12-03 DIAGNOSIS — K439 Ventral hernia without obstruction or gangrene: Secondary | ICD-10-CM

## 2012-12-03 DIAGNOSIS — R109 Unspecified abdominal pain: Secondary | ICD-10-CM

## 2012-12-03 MED ORDER — IOHEXOL 300 MG/ML  SOLN
125.0000 mL | Freq: Once | INTRAMUSCULAR | Status: AC | PRN
  Administered 2012-12-03: 125 mL via INTRAVENOUS

## 2012-12-03 NOTE — Telephone Encounter (Signed)
Patient has CT abd/pel scheduled this morning to r/o hernia. She is calling stating she is unable to keep down her oral contrast. Per Dr [name] - patient to get down as much contrast as she can- sip slowly and proceed with CT scan. Patient aware.

## 2012-12-05 ENCOUNTER — Encounter (INDEPENDENT_AMBULATORY_CARE_PROVIDER_SITE_OTHER): Payer: Self-pay | Admitting: Surgery

## 2012-12-17 ENCOUNTER — Encounter (INDEPENDENT_AMBULATORY_CARE_PROVIDER_SITE_OTHER): Payer: Self-pay | Admitting: Surgery

## 2012-12-28 ENCOUNTER — Other Ambulatory Visit (INDEPENDENT_AMBULATORY_CARE_PROVIDER_SITE_OTHER): Payer: Self-pay | Admitting: Surgery

## 2013-01-01 ENCOUNTER — Other Ambulatory Visit: Payer: Self-pay | Admitting: Orthopedic Surgery

## 2013-01-01 DIAGNOSIS — M545 Low back pain: Secondary | ICD-10-CM

## 2013-01-01 NOTE — Telephone Encounter (Signed)
Yes she may have a refill

## 2013-01-01 NOTE — Telephone Encounter (Signed)
Can this patient have this Rx 

## 2013-01-07 ENCOUNTER — Encounter (INDEPENDENT_AMBULATORY_CARE_PROVIDER_SITE_OTHER): Payer: Self-pay | Admitting: Surgery

## 2013-01-07 ENCOUNTER — Ambulatory Visit (INDEPENDENT_AMBULATORY_CARE_PROVIDER_SITE_OTHER): Payer: Medicaid Other | Admitting: Surgery

## 2013-01-07 VITALS — BP 158/100 | HR 76 | Resp 14 | Ht 65.5 in | Wt 313.8 lb

## 2013-01-07 DIAGNOSIS — K439 Ventral hernia without obstruction or gangrene: Secondary | ICD-10-CM

## 2013-01-07 NOTE — Progress Notes (Signed)
Patient ID: [name], female   DOB: Jan 02, 1976, 37 y.o.   MRN: [id number]  Chief Complaint  Patient presents with  . ventral hernia    HPI [name] is a 37 y.o. female.   HPI This patient underwent open ventral hernia repair with mesh for a large supraumbilical ventral hernia on 10/03/11. She had a large infected seroma around the mesh that had to be drained. She had to have a diagnostic laparoscopy with drainage of the seroma. She was discharged home on 10/29/11.  Recently the patient had developed some sepsis from a severe kidney infection. She was hospitalized in [place]. CT scan at that time showed the left kidney inflammation but also incidentally noted a small recurrent ventral hernia to the left just above her umbilicus. This contains only some fat. The patient denies any symptoms in this area. Occasionally she gets some twinges of pain in her left lower quadrant but denies any swelling. No digestive symptoms. She feels that she has recovered from her recent bout of urosepsis.   She had a CT scan showing recurrent ventral hernia to the left of midline.  This is causing more discomfort, especially when she is on her feet.  Past Medical History  Diagnosis Date  . Diabetes mellitus   . Migraine   . Hypoactive thyroid   . Cancer     cervical  . Osteoporosis   . Hypertension   . High cholesterol   . Anginal pain 2011    "stress per Dr Pang"-occ at present from increased stress  . Dysrhythmia     hx MVP  . GERD (gastroesophageal reflux disease)   . Chronic kidney disease     stones in past,  hematuria daily- states hasnt been evaluated due to lack of insurance  . Psoriasis     scattered throughout body- large amount- states increased with stress of surgery  . Fatty liver     with gall bladder sludge by pt history/ no stones  . Stroke     ? subacranoid bleed-  no defits  . Mental disorder     bipolar  . Anxiety   . Sleep apnea      STOP BANG SCORE 6  .  Ventral hernia 09/13/2011  . Depression   . Shortness of breath     with exertion/   ? adult onset asthma  . Pneumonia     hx of   . Arthritis   . Anemia     hx of anemia during pregnancy   . PONV (postoperative nausea and vomiting)     also hard to get asleep per pt   . Bipolar disorder     Past Surgical History  Procedure Laterality Date  . Cesarean section    . Appendectomy    . Lipoma      total 3  . Neuroplasty / transposition median nerve at carpal tunnel bilateral    . Cervical cancer    . Kidney stone surgery    . Ventral hernia repair  10/03/2011    Procedure: HERNIA REPAIR VENTRAL ADULT;  Surgeon: Wilmon Arms. Nuriya Stuck, MD;  Location: [facility];  Service: General;  Laterality: N/A;  open ventral hernia repair with mesh  . Laparoscopy  10/22/2011    Procedure: LAPAROSCOPY DIAGNOSTIC;  Surgeon: Wilmon Arms. [name], MD;  Location: [facility];  Service: General;  Laterality: N/A;  . Hernia repair      Family History  Problem Relation Age of Onset  .  Hypertension Mother   . Glaucoma Mother   . Hypertension Father   . Cancer Father     precancerious polyps  . Cancer Maternal Uncle     colon    Social History History  Substance Use Topics  . Smoking status: Never Smoker   . Smokeless tobacco: Never Used  . Alcohol Use: No    Allergies  Allergen Reactions  . Cephalexin Anaphylaxis and Shortness Of Breath    But can tolerate Ceftin and Amoxicillin per patient  . Cephalexin Anaphylaxis    But can tolerate Ceftin and Amoxicillin per patient  . Latex Itching  . Lisinopril Cough and Other (See Comments)    Cough  . Simvastatin Other (See Comments)    Back pain  . Statins Other (See Comments) and Nausea And Vomiting    Back pain Back pain   . Augmentin [Amoxicillin-Pot Clavulanate] Rash    Other reaction(s): Swelling Rash, eye swelling Rash, eye swelling  . Tea Rash    Current Outpatient Prescriptions  Medication Sig Dispense Refill  . acetaminophen (TYLENOL) 500 MG  tablet 500 mg. Take 500 mg by mouth every 6 (six) hours as needed.      Marland Kitchen acyclovir (ZOVIRAX) 200 MG capsule Take 400 mg by mouth 2 (two) times daily.       Marland Kitchen acyclovir (ZOVIRAX) 200 MG capsule TAKE 2 CAPSULES (400 MG TOTAL) BY MOUTH 2 (TWO) TIMES DAILY.      Marland Kitchen ALPRAZolam (XANAX) 0.5 MG tablet Take 1 mg by mouth 3 (three) times daily as needed. For anxiety      . Armodafinil (NUVIGIL) 250 MG tablet 250 mg. Take 250 mg by mouth daily.      Marland Kitchen aspirin 81 MG EC tablet 81 mg. Take 81 mg by mouth daily.      . Biotin 800 MCG TABS Take by mouth.      . Cholecalciferol (VITAMIN D) 1000 UNITS capsule Take 5,000 Units by mouth daily.      . Cholecalciferol (VITAMIN D3) 5000 UNITS CAPS Take 1 capsule by mouth daily.      . ciclopirox (PENLAC) 8 % solution Apply topically at bedtime. Apply daily on nail and surrounding skin over previous coat. After 7 days remove with alcohol and repeat cycle.      . cycloSPORINE (SANDIMMUNE) 100 MG capsule Take 100 mg by mouth every other day.       . diltiazem (CARDIZEM SR) 120 MG 12 hr capsule 120 mg. Take 1 capsule (120 mg total) by mouth 2 (two) times daily.      Marland Kitchen doxycycline (VIBRA-TABS) 100 MG tablet TAKE 1 TABLET TWICE A DAY  60 tablet  2  . ferrous sulfate 325 (65 FE) MG EC tablet 325 mg. Take 1 tablet (325 mg total) by mouth 2 (two) times daily.      Marland Kitchen gabapentin (NEURONTIN) 300 MG capsule TAKE ONE CAPSULE AT BEDTIME      . glucose blood (ACCU-CHEK AVIVA) test strip Use as directed      . HYDROcodone-acetaminophen (NORCO) 10-325 MG per tablet Take 2 tablets by mouth every 4 (four) hours as needed. As needed for pain.      Marland Kitchen ibuprofen (ADVIL,MOTRIN) 200 MG tablet Take 800 mg by mouth every 6 (six) hours as needed. For pain      . levothyroxine (SYNTHROID, LEVOTHROID) 112 MCG tablet Take 112 mcg by mouth daily before breakfast.       . levothyroxine (SYNTHROID, LEVOTHROID) 125 MCG tablet 125  mcg. Take 125 mcg by mouth daily.      . metFORMIN (GLUCOPHAGE) 1000 MG  tablet Take 1,000 mg by mouth 2 (two) times daily with a meal.      . metFORMIN (GLUCOPHAGE) 1000 MG tablet TAKE 1 TABLET TWICE A DAY WITH FOOD      . nitrofurantoin, macrocrystal-monohydrate, (MACROBID) 100 MG capsule Take 100 mg by mouth 2 (two) times daily.      . ondansetron (ZOFRAN) 4 MG tablet Take 4 mg by mouth every 8 (eight) hours as needed. For nausea      . oxybutynin (DITROPAN) 5 MG tablet 5 mg. Take 5 mg by mouth 3 (three) times a day.      . Prenat-FeFum-FePo-FA-Omega 3 (CONCEPT DHA) 53.5-38-1 MG CAPS 1 tablet. Take 1 tablet by mouth daily.      . Prenatal Vit-Fe Fumarate-FA (MULTIVITAMIN-PRENATAL) 27-0.8 MG TABS Take 1 tablet by mouth daily.      . promethazine (PHENERGAN) 25 MG tablet TAKE 1 TAB BY MOUTH 30 MIN PRIOR TO IV INFUSION, THEN TAKE 1 TAB EVERY 6 HOURS AS NEEDED FOR NAUSEA      . QUEtiapine (SEROQUEL) 25 MG tablet TAKE 1 TABLET AT BEDTIME FOR 1 WEEK THEN INCREASE TO 2 TABLETS AT BEDTIME THEREAFTER      . sertraline (ZOLOFT) 100 MG tablet 100 mg. Take 100 mg by mouth daily.      . sertraline (ZOLOFT) 50 MG tablet Take 50 mg by mouth daily.      . Sodium Oxybate (XYREM) 500 MG/ML SOLN        No current facility-administered medications for this visit.   Facility-Administered Medications Ordered in Other Visits  Medication Dose Route Frequency Provider Last Rate Last Dose  . midazolam (VERSED) 5 MG/5ML injection    PRN Clydene Pugh [place], CRNA   2 mg at 08/06/11 1610    Review of Systems Review of Systems  Constitutional: Negative for fever, chills and unexpected weight change.  HENT: Negative for congestion, hearing loss, sore throat, trouble swallowing and voice change.   Eyes: Negative for visual disturbance.  Respiratory: Negative for cough and wheezing.   Cardiovascular: Negative for chest pain, palpitations and leg swelling.  Gastrointestinal: Positive for abdominal pain and abdominal distention. Negative for nausea, vomiting, diarrhea, constipation, blood in  stool and anal bleeding.  Genitourinary: Negative for hematuria, vaginal bleeding and difficulty urinating.  Musculoskeletal: Negative for arthralgias.  Skin: Negative for rash and wound.  Neurological: Negative for seizures, syncope and headaches.  Hematological: Negative for adenopathy. Does not bruise/bleed easily.  Psychiatric/Behavioral: Negative for confusion.    Blood pressure 158/100, pulse 76, resp. rate 14, height 5' 5.5" (1.664 m), weight 313 lb 12.8 oz (142.339 kg).  Physical Exam Physical Exam WDWN in NAD HEENT:  EOMI, sclera anicteric Neck:  No masses, no thyromegaly Lungs:  CTA bilaterally; normal respiratory effort CV:  Regular rate and rhythm; no murmurs Abd:  +bowel sounds, soft, healed incision with no midline hernia; small palpable bulge to the left of midline incision - partially reducible Ext:  Well-perfused; no edema Skin:  Warm, dry; no sign of jaundice  Data Reviewed  CLINICAL DATA: Abdominal pain, evaluate for recurrent hernia  EXAM:  CT ABDOMEN AND PELVIS WITH CONTRAST  TECHNIQUE:  Multidetector CT imaging of the abdomen and pelvis was performed  using the standard protocol following bolus administration of  intravenous contrast.  CONTRAST: OMNIPAQUE IOHEXOL 300 MG/ML SOLN  COMPARISON: 11/09/2011  FINDINGS:  Lung bases are  clear.  Suspected hepatic steatosis.  Spleen, pancreas, and adrenal glands are within normal limits.  Gallbladder is unremarkable. No intrahepatic or extrahepatic ductal  dilatation.  Kidneys are within normal limits. No hydronephrosis.  No evidence of bowel obstruction.  No evidence of abdominal aortic aneurysm.  No abdominopelvic ascites.  No suspicious abdominopelvic lymphadenopathy.  Uterus and bilateral ovaries are unremarkable.  Bladder is within normal limits.  Small fat containing left paramidline ventral hernia (series 3/image  57). Multiple additional tiny fat containing ventral hernias (series  3/images 40,  53, 57, 61-62). No drainable fluid collection/abscess.  Mild degenerative changes of the visualized thoracolumbar spine.  IMPRESSION:  Small fat containing left paramidline ventral hernia. Multiple  additional tiny fat containing ventral hernias.  No drainable fluid collections os abscess.  No evidence of bowel obstruction.  Electronically Signed  By: Charline Bills M.D.  On: 12/03/2012 13:46    Assessment    Recurrent small left paramidline ventral hernia with surrounding swiss-cheese defects - no bowel involvement     Plan    Laparoscopic ventral hernia repair with mesh.  The surgical procedure has been discussed with the patient.  Potential risks, benefits, alternative treatments, and expected outcomes have been explained.  All of the patient's questions at this time have been answered.  The likelihood of reaching the patient's treatment goal is good.  The patient understand the proposed surgical procedure and wishes to proceed.         Elson Ulbrich K. 01/07/2013, 2:53 PM

## 2013-01-10 ENCOUNTER — Encounter (INDEPENDENT_AMBULATORY_CARE_PROVIDER_SITE_OTHER): Payer: Self-pay | Admitting: Surgery

## 2013-01-10 ENCOUNTER — Ambulatory Visit
Admission: RE | Admit: 2013-01-10 | Discharge: 2013-01-10 | Disposition: A | Discharge status: Home / Self Care | Payer: Medicaid Other | Source: Ambulatory Visit | Attending: Orthopedic Surgery | Admitting: Orthopedic Surgery

## 2013-01-10 DIAGNOSIS — M545 Low back pain: Secondary | ICD-10-CM

## 2013-01-13 ENCOUNTER — Telehealth (INDEPENDENT_AMBULATORY_CARE_PROVIDER_SITE_OTHER): Payer: Self-pay | Admitting: General Surgery

## 2013-01-13 NOTE — Telephone Encounter (Signed)
LMOM to let the patient know that she called wanted to know about start Cyclosporine to prevent Psoriasis flare prior to surgery. Dr [name] answer was No. Minizide the immune suppression to increase your change of healing well

## 2013-01-30 ENCOUNTER — Encounter (HOSPITAL_COMMUNITY): Payer: Self-pay | Admitting: Pharmacy Technician

## 2013-02-02 ENCOUNTER — Encounter (INDEPENDENT_AMBULATORY_CARE_PROVIDER_SITE_OTHER): Payer: Self-pay | Admitting: Surgery

## 2013-02-05 ENCOUNTER — Encounter (HOSPITAL_COMMUNITY)
Admission: RE | Admit: 2013-02-05 | Discharge: 2013-02-05 | Disposition: A | Discharge status: Home / Self Care | Payer: Medicaid Other | Source: Ambulatory Visit | Attending: Surgery | Admitting: Surgery

## 2013-02-05 ENCOUNTER — Other Ambulatory Visit (INDEPENDENT_AMBULATORY_CARE_PROVIDER_SITE_OTHER): Payer: Self-pay | Admitting: Surgery

## 2013-02-05 ENCOUNTER — Telehealth (INDEPENDENT_AMBULATORY_CARE_PROVIDER_SITE_OTHER): Payer: Self-pay | Admitting: General Surgery

## 2013-02-05 ENCOUNTER — Encounter (HOSPITAL_COMMUNITY): Payer: Self-pay

## 2013-02-05 DIAGNOSIS — Z01812 Encounter for preprocedural laboratory examination: Secondary | ICD-10-CM | POA: Insufficient documentation

## 2013-02-05 DIAGNOSIS — Z01818 Encounter for other preprocedural examination: Secondary | ICD-10-CM | POA: Insufficient documentation

## 2013-02-05 DIAGNOSIS — N390 Urinary tract infection, site not specified: Secondary | ICD-10-CM

## 2013-02-05 HISTORY — DX: Unspecified convulsions: R56.9

## 2013-02-05 HISTORY — DX: Personal history of other medical treatment: Z92.89

## 2013-02-05 HISTORY — DX: Family history of other specified conditions: Z84.89

## 2013-02-05 LAB — BASIC METABOLIC PANEL
BUN: 17 mg/dL (ref 6–23)
CHLORIDE: 105 meq/L (ref 96–112)
CO2: 25 meq/L (ref 19–32)
CREATININE: 0.73 mg/dL (ref 0.50–1.10)
Calcium: 9.4 mg/dL (ref 8.4–10.5)
GFR calc Af Amer: >90 mL/min (ref >90)
GFR calc non Af Amer: >90 mL/min (ref >90)
Glucose, Bld: 146 mg/dL — ABNORMAL HIGH (ref 70–99)
Potassium: 4 mEq/L (ref 3.7–5.3)
Sodium: 144 mEq/L (ref 137–147)

## 2013-02-05 LAB — CBC
HCT: 37.9 % (ref 36.0–46.0)
Hemoglobin: 12.3 g/dL (ref 12.0–15.0)
MCH: 29.4 pg (ref 26.0–34.0)
MCHC: 32.5 g/dL (ref 30.0–36.0)
MCV: 90.7 fL (ref 78.0–100.0)
Platelets: 273 10*3/uL (ref 150–400)
RBC: 4.18 MIL/uL (ref 3.87–5.11)
RDW: 15 % (ref 11.5–15.5)
WBC: 20.1 10*3/uL — AB (ref 4.0–10.5)

## 2013-02-05 LAB — SURGICAL PCR SCREEN
MRSA, PCR: NEGATIVE
STAPHYLOCOCCUS AUREUS: NEGATIVE

## 2013-02-05 LAB — HCG, SERUM, QUALITATIVE: Preg, Serum: NEGATIVE

## 2013-02-05 NOTE — Progress Notes (Signed)
Call to [facility], reviewed pt. History of dysrythmia /w sepsis at [facility] 06/2012. Will attempt to get records from [place].

## 2013-02-05 NOTE — Telephone Encounter (Signed)
Called patient and told to go tomorrow and get a UA done before surgery, we do not want her to get a UTI because of mesh being used in her surgery. And the patient will go tomorrow to get the UA done at the [address] And patient wanted to know about have a pic line put in and I told her that she can talk to Dr Georgette Dover the morning of surgery about the pic line

## 2013-02-05 NOTE — Progress Notes (Signed)
Call to [facility] lung & sleep wellness, requested most recent study.

## 2013-02-05 NOTE — Pre-Procedure Instructions (Signed)
[name]  02/05/2013   Your procedure is scheduled on:  02/12/2013 Thursday  Report to [place]  2 * 3  Enter through [place] [facility]  at 11:00 AM.  Call this number if you have problems the morning of surgery: [phone]   Remember:   Do not eat food or drink liquids after midnight. On Wednesday  Take these medicines the morning of surgery with A SIP OF WATER: Acyclovir, Ditilazem,Levothyroxine   Do not wear jewelry, make-up or nail polish.  Do not wear lotions, powders, or perfumes. You may wear deodorant.  Do not shave 48 hours prior to surgery.   Do not bring valuables to the hospital.  [facility] is not responsible                  for any belongings or valuables.               Contacts, dentures or bridgework may not be worn into surgery.  Leave suitcase in the car. After surgery it may be brought to your room.  For patients admitted to the hospital, discharge time is determined by your                treatment team.               Patients discharged the day of surgery will not be allowed to drive  home.  Name and phone number of your driver: call family for emergency, pt.  Plan isSpending a few days at the hosp., family will drive her home when she is ready for d/c    Special Instructions: Shower using CHG 2 nights before surgery and the night before surgery.  If you shower the day of surgery use CHG.  Use special wash - you have one bottle of CHG for all showers.  You should use approximately 1/3 of the bottle for each shower.   Please read over the following fact sheets that you were given: Pain Booklet, Coughing and Deep Breathing, MRSA Information and Surgical Site Infection Prevention

## 2013-02-06 LAB — URINALYSIS
BILIRUBIN URINE: NEGATIVE
Glucose, UA: NEGATIVE mg/dL
Hgb urine dipstick: NEGATIVE
Ketones, ur: NEGATIVE mg/dL
Leukocytes, UA: NEGATIVE
Nitrite: NEGATIVE
Protein, ur: NEGATIVE mg/dL
SPECIFIC GRAVITY, URINE: 1.029 (ref 1.005–1.030)
Urobilinogen, UA: 1 mg/dL (ref 0.0–1.0)
pH: 6 (ref 5.0–8.0)

## 2013-02-06 NOTE — Progress Notes (Addendum)
Anesthesia Chart Review:  Patient is a 38 year old female scheduled for laparoscopic ventral hernia repair with mesh on 02/12/13 by Dr. Georgette Dover.    History includes morbid obesity, non-smoker, HTN, MVP,  hypothyroidism, anxiety, depression, OSA, febrile seizures as a child, anemia, hemorrhagic CVA [facility]) '07, chest pain with reassuring cardiopulmonary stress test 08/2011, anemia, hypercholesterolemia, psoriasis, previous VHR 10/03/11 and LOA 09/3011. She has a history of nephrolithiasis with hospitalization at [facility] 06/2012 for quinolone resistant E. Coli urosepsis/pyelonpehritis in the setting of left ureteropelvic junction stone with hydronephrosis s/p left ureteral stent 07/09/2012.  I'm still awaiting records from [facility], but it appears that during her hospitalization for urosepsis she had a brief episode of PAF that resolved with IV cardizem. PCP is Dr. Joneen Caraway.  She was previously evaluated by cardiologist Dr. Sanda Klein in 09/2011.  Anesthesia history: Difficult IV access (can obtain IV access, but cannot last for very long--may need PICC if prolonged IV access is needed). She states she is also "hard to get to sleep."  Echo on 07/17/12 [facility]; under [place]) showed: Left Ventricle: The left ventricle is grossly normal size. There is normal left ventricular wall thickness. The left ventricular ejection fraction is normal (50-55%). The left ventricular wall motion is normal. Unable to adequately determine diastolic dysfunction. Right Ventricle :The right ventricle is grossly normal in size and function. Atria: The left atrium is mildly dilated. The right atrium is normal. Mitral Valve: The mitral valve is grossly normal. There is trace mitral regurgitation. Tricuspid Valve: The tricuspid valve is grossly normal. Pulmonary hypertension is not suggested by Doppler findings. Aortic Valve: The aortic valve is not well visualized, but is grossly normal. There is no aortic  regurgitation present. Pulmonic Valve: The pulmonic valve is not well visualized. Vessels: The aortic root is not well visualized, but is probably normal. Pericardium: Trace pericardial effusion.  Cardiopulmonary stress test on 09/17/11 which was felt reassuring.  (The complete report is scanned under CV procedures.)  This was done as part of a work-up for chest pain and clearance for a previous VHR in 09/2011.   Preoperative labs noted.  WBC 20.1K.  Dr. Georgette Dover is aware.  He had her get a UA today that was WNL.    I'll follow-up once additional records received.  George Hugh [facility] Short Stay Center/Anesthesiology Phone [phone] 02/06/2013 6:05 PM  Addendum: 02/09/2013 2:)0 PM  I called and spoke with patient.  She reports that since her brief afib converted with IV cardizem, cardiology was not consulted.  An echo was done and was fairly unremarkable.  She maintained SR for the remainder of her hospitalization.  She is unaware of any recurrences, and denies any new chest pain, SOB, palpitations.  She remains on oral cardizem. In regards to her leukocytosis, her UA was remarkable and she denied other acute symptoms such as fever, cough, red joint, rash.  She does have some diarrhea, but this is not atypical for her. She did have a steroid injection to both ankles and her right wrist on 02/04/13 which may be contributing.    I reviewed cardiac history with anesthesiologist Dr. Marcie Bal. Plan to update EKG preoperatively to ensure that she continues to maintain NSR.  I did send Dr. Georgette Dover and his CMA Deneise Lever a staff message regarding 1) that she was given a steroid injection last week, and to let me or our scheduler know if he wanted her to come in for a repeat CBC preoperatively,  2) that she may need a PICC post-operatively.  I also notified Deneise Lever that patient dad not held her ASA yet, so she was going to contact patient about holding.

## 2013-02-09 ENCOUNTER — Encounter (HOSPITAL_COMMUNITY): Payer: Self-pay

## 2013-02-09 ENCOUNTER — Telehealth (INDEPENDENT_AMBULATORY_CARE_PROVIDER_SITE_OTHER): Payer: Self-pay | Admitting: General Surgery

## 2013-02-09 NOTE — Telephone Encounter (Signed)
LMOM for patient to call back and ask for Kimm Sider 

## 2013-02-09 NOTE — Progress Notes (Signed)
I spoke with Deneise Lever in Dr. Kellie Moor office. She said she has left a message with patient to stop her aspirin 5 days prior to surgery, and she was also told this in the office. Myra Gianotti, PA-C also notified of this.

## 2013-02-09 NOTE — Telephone Encounter (Signed)
Message copied by Maryclare Bean on Mon Feb 09, 2013  1:59 PM ------      Message from: Jacinta Shoe      Created: Mon Feb 09, 2013  1:35 PM      Regarding: [name],      I already sent Dr. Georgette Dover a message about this, but if her wants her CBC repeated prior to her surgery date then our schedulers would need to know to bring her in.  (He had her get a UA on Friday.  Also, she told me that she received a steroid injection in both ankles and right wrist on 02/04/13 which may be causing her elevated WBC.)            In regards to her ASA.  I'll let our nurses know that you said Dr. Georgette Dover has his patients hold for five days.  If it's not in the orders though, they may still call. Also it's now 1/19, so she won't be off ASA for a full 5 days.            I wanted to bring these to your attention so you could discuss with one of Dr. Vonna Kotyk partners if needed--since I am told he is unavailable.            George Hugh      [facility] Short Stay Center/Anesthesiology      Phone [phone]      02/09/2013 1:45 PM             ------

## 2013-02-09 NOTE — Telephone Encounter (Signed)
Patient called me back and she stated that she understood about the ASA and she will not take it 5 days before surgery. And for the steroid injections she stated that she has stopped them 7 days before surgery.

## 2013-02-11 MED ORDER — SODIUM CHLORIDE 0.9 % IV SOLN
1500.0000 mg | INTRAVENOUS | Status: AC
  Administered 2013-02-12: 1500 mg via INTRAVENOUS
  Filled 2013-02-11: qty 1500

## 2013-02-11 MED ORDER — CHLORHEXIDINE GLUCONATE 4 % EX LIQD: 1.0000 "application " | Freq: Once | CUTANEOUS | Status: DC

## 2013-02-12 ENCOUNTER — Encounter (HOSPITAL_COMMUNITY)
Admission: RE | Disposition: A | Discharge status: Home / Self Care | Payer: Self-pay | Source: Ambulatory Visit | Attending: Surgery

## 2013-02-12 ENCOUNTER — Encounter (HOSPITAL_COMMUNITY): Payer: Self-pay | Admitting: *Deleted

## 2013-02-12 ENCOUNTER — Encounter (HOSPITAL_COMMUNITY): Payer: Medicaid Other | Admitting: Vascular Surgery

## 2013-02-12 ENCOUNTER — Inpatient Hospital Stay (HOSPITAL_COMMUNITY)
Admission: RE | Admit: 2013-02-12 | Discharge: 2013-02-15 | DRG: 336 | Disposition: A | Discharge status: Home / Self Care | Payer: Medicaid Other | Source: Ambulatory Visit | Attending: Surgery | Admitting: Surgery

## 2013-02-12 ENCOUNTER — Inpatient Hospital Stay (HOSPITAL_COMMUNITY): Payer: Medicaid Other | Admitting: Anesthesiology

## 2013-02-12 ENCOUNTER — Inpatient Hospital Stay (HOSPITAL_COMMUNITY): Payer: Medicaid Other

## 2013-02-12 DIAGNOSIS — K432 Incisional hernia without obstruction or gangrene: Principal | ICD-10-CM | POA: Diagnosis present

## 2013-02-12 DIAGNOSIS — K7689 Other specified diseases of liver: Secondary | ICD-10-CM | POA: Diagnosis present

## 2013-02-12 DIAGNOSIS — I129 Hypertensive chronic kidney disease with stage 1 through stage 4 chronic kidney disease, or unspecified chronic kidney disease: Secondary | ICD-10-CM | POA: Diagnosis present

## 2013-02-12 DIAGNOSIS — G4730 Sleep apnea, unspecified: Secondary | ICD-10-CM | POA: Diagnosis present

## 2013-02-12 DIAGNOSIS — F3289 Other specified depressive episodes: Secondary | ICD-10-CM | POA: Diagnosis present

## 2013-02-12 DIAGNOSIS — E7800 Pure hypercholesterolemia, unspecified: Secondary | ICD-10-CM | POA: Diagnosis present

## 2013-02-12 DIAGNOSIS — M129 Arthropathy, unspecified: Secondary | ICD-10-CM | POA: Diagnosis present

## 2013-02-12 DIAGNOSIS — E119 Type 2 diabetes mellitus without complications: Secondary | ICD-10-CM | POA: Diagnosis present

## 2013-02-12 DIAGNOSIS — K219 Gastro-esophageal reflux disease without esophagitis: Secondary | ICD-10-CM | POA: Diagnosis present

## 2013-02-12 DIAGNOSIS — Z8673 Personal history of transient ischemic attack (TIA), and cerebral infarction without residual deficits: Secondary | ICD-10-CM

## 2013-02-12 DIAGNOSIS — N189 Chronic kidney disease, unspecified: Secondary | ICD-10-CM | POA: Diagnosis present

## 2013-02-12 DIAGNOSIS — Z6841 Body Mass Index (BMI) 40.0 and over, adult: Secondary | ICD-10-CM

## 2013-02-12 DIAGNOSIS — K439 Ventral hernia without obstruction or gangrene: Secondary | ICD-10-CM

## 2013-02-12 DIAGNOSIS — F329 Major depressive disorder, single episode, unspecified: Secondary | ICD-10-CM | POA: Diagnosis present

## 2013-02-12 DIAGNOSIS — K660 Peritoneal adhesions (postprocedural) (postinfection): Secondary | ICD-10-CM | POA: Diagnosis present

## 2013-02-12 DIAGNOSIS — M810 Age-related osteoporosis without current pathological fracture: Secondary | ICD-10-CM | POA: Diagnosis present

## 2013-02-12 DIAGNOSIS — F411 Generalized anxiety disorder: Secondary | ICD-10-CM | POA: Diagnosis present

## 2013-02-12 DIAGNOSIS — L408 Other psoriasis: Secondary | ICD-10-CM | POA: Diagnosis present

## 2013-02-12 HISTORY — PX: VENTRAL HERNIA REPAIR: SHX424

## 2013-02-12 HISTORY — PX: INSERTION OF MESH: SHX5868

## 2013-02-12 HISTORY — PX: LAPAROSCOPIC LYSIS OF ADHESIONS: SHX5905

## 2013-02-12 LAB — GLUCOSE, CAPILLARY
GLUCOSE-CAPILLARY: 148 mg/dL — AB (ref 70–99)
GLUCOSE-CAPILLARY: 155 mg/dL — AB (ref 70–99)
Glucose-Capillary: 251 mg/dL — ABNORMAL HIGH (ref 70–99)

## 2013-02-12 SURGERY — REPAIR, HERNIA, VENTRAL, LAPAROSCOPIC
Anesthesia: General | Site: Abdomen

## 2013-02-12 MED ORDER — HYDROMORPHONE HCL PF 1 MG/ML IJ SOLN
INTRAMUSCULAR | Status: AC
  Administered 2013-02-12: 0.5 mg via INTRAVENOUS
  Filled 2013-02-12: qty 1

## 2013-02-12 MED ORDER — PROPOFOL 10 MG/ML IV BOLUS
INTRAVENOUS | Status: DC | PRN
  Administered 2013-02-12: 160 mg via INTRAVENOUS

## 2013-02-12 MED ORDER — PHENYLEPHRINE 40 MCG/ML (10ML) SYRINGE FOR IV PUSH (FOR BLOOD PRESSURE SUPPORT)
PREFILLED_SYRINGE | INTRAVENOUS | Status: AC
  Filled 2013-02-12: qty 10

## 2013-02-12 MED ORDER — DEXAMETHASONE SODIUM PHOSPHATE 4 MG/ML IJ SOLN
INTRAMUSCULAR | Status: AC
  Filled 2013-02-12: qty 2

## 2013-02-12 MED ORDER — VANCOMYCIN HCL 10 G IV SOLR
1500.0000 mg | Freq: Two times a day (BID) | INTRAVENOUS | Status: AC
  Administered 2013-02-13 (×2): 1500 mg via INTRAVENOUS
  Filled 2013-02-12 (×3): qty 1500

## 2013-02-12 MED ORDER — ROCURONIUM BROMIDE 50 MG/5ML IV SOLN
INTRAVENOUS | Status: AC
  Filled 2013-02-12: qty 1

## 2013-02-12 MED ORDER — HYDROMORPHONE 0.3 MG/ML IV SOLN
INTRAVENOUS | Status: AC
  Filled 2013-02-12: qty 25

## 2013-02-12 MED ORDER — INSULIN ASPART 100 UNIT/ML ~~LOC~~ SOLN
0.0000 [IU] | Freq: Three times a day (TID) | SUBCUTANEOUS | Status: DC
  Administered 2013-02-13: 3 [IU] via SUBCUTANEOUS
  Administered 2013-02-13: 4 [IU] via SUBCUTANEOUS
  Administered 2013-02-13: 3 [IU] via SUBCUTANEOUS
  Administered 2013-02-14 (×2): 4 [IU] via SUBCUTANEOUS
  Administered 2013-02-14 – 2013-02-15 (×2): 3 [IU] via SUBCUTANEOUS

## 2013-02-12 MED ORDER — DIPHENHYDRAMINE HCL 50 MG/ML IJ SOLN: 12.5000 mg | Freq: Four times a day (QID) | INTRAMUSCULAR | Status: DC | PRN

## 2013-02-12 MED ORDER — ONDANSETRON HCL 4 MG PO TABS: 4.0000 mg | ORAL_TABLET | Freq: Four times a day (QID) | ORAL | Status: DC | PRN

## 2013-02-12 MED ORDER — GLYCOPYRROLATE 0.2 MG/ML IJ SOLN
INTRAMUSCULAR | Status: AC
  Filled 2013-02-12: qty 3

## 2013-02-12 MED ORDER — SODIUM CHLORIDE 0.9 % IR SOLN
Status: DC | PRN
  Administered 2013-02-12: 1

## 2013-02-12 MED ORDER — NEOSTIGMINE METHYLSULFATE 1 MG/ML IJ SOLN
INTRAMUSCULAR | Status: DC | PRN
  Administered 2013-02-12: 4 mg via INTRAVENOUS

## 2013-02-12 MED ORDER — DEXAMETHASONE SODIUM PHOSPHATE 10 MG/ML IJ SOLN
INTRAMUSCULAR | Status: DC | PRN
  Administered 2013-02-12: 8 mg via INTRAVENOUS

## 2013-02-12 MED ORDER — DIPHENHYDRAMINE HCL 12.5 MG/5ML PO ELIX: 12.5000 mg | ORAL_SOLUTION | Freq: Four times a day (QID) | ORAL | Status: DC | PRN

## 2013-02-12 MED ORDER — ONDANSETRON HCL 4 MG/2ML IJ SOLN: 4.0000 mg | Freq: Four times a day (QID) | INTRAMUSCULAR | Status: DC | PRN

## 2013-02-12 MED ORDER — PHENYLEPHRINE HCL 10 MG/ML IJ SOLN
INTRAMUSCULAR | Status: DC | PRN
  Administered 2013-02-12 (×2): 80 ug via INTRAVENOUS

## 2013-02-12 MED ORDER — SCOPOLAMINE 1 MG/3DAYS TD PT72
MEDICATED_PATCH | TRANSDERMAL | Status: DC | PRN
  Administered 2013-02-12: 1 via TRANSDERMAL

## 2013-02-12 MED ORDER — LACTATED RINGERS IV SOLN
INTRAVENOUS | Status: DC
  Administered 2013-02-12: 50 mL/h via INTRAVENOUS

## 2013-02-12 MED ORDER — NALOXONE HCL 0.4 MG/ML IJ SOLN: 0.4000 mg | INTRAMUSCULAR | Status: DC | PRN

## 2013-02-12 MED ORDER — INSULIN ASPART 100 UNIT/ML ~~LOC~~ SOLN
6.0000 [IU] | Freq: Three times a day (TID) | SUBCUTANEOUS | Status: DC
  Administered 2013-02-13 – 2013-02-15 (×7): 6 [IU] via SUBCUTANEOUS

## 2013-02-12 MED ORDER — QUETIAPINE FUMARATE 100 MG PO TABS
100.0000 mg | ORAL_TABLET | Freq: Every day | ORAL | Status: DC
  Administered 2013-02-12 – 2013-02-14 (×3): 100 mg via ORAL
  Filled 2013-02-12 (×4): qty 1

## 2013-02-12 MED ORDER — SODIUM CHLORIDE 0.9 % IJ SOLN: 9.0000 mL | INTRAMUSCULAR | Status: DC | PRN

## 2013-02-12 MED ORDER — ONDANSETRON HCL 4 MG/2ML IJ SOLN
INTRAMUSCULAR | Status: AC
  Filled 2013-02-12: qty 2

## 2013-02-12 MED ORDER — HYDROMORPHONE 0.3 MG/ML IV SOLN
INTRAVENOUS | Status: DC
  Administered 2013-02-12: 1.5 mg via INTRAVENOUS
  Administered 2013-02-12 – 2013-02-13 (×2): via INTRAVENOUS
  Administered 2013-02-13: 0.9 mg via INTRAVENOUS
  Administered 2013-02-13: 0.6 mg via INTRAVENOUS
  Administered 2013-02-13: 0.9 mg via INTRAVENOUS
  Administered 2013-02-13: 2.1 mg via INTRAVENOUS
  Administered 2013-02-14: 0.9 mg via INTRAVENOUS
  Administered 2013-02-14: 0.6 mg via INTRAVENOUS
  Filled 2013-02-12: qty 25

## 2013-02-12 MED ORDER — BUPIVACAINE-EPINEPHRINE (PF) 0.25% -1:200000 IJ SOLN
INTRAMUSCULAR | Status: AC
  Filled 2013-02-12: qty 30

## 2013-02-12 MED ORDER — MIDAZOLAM HCL 5 MG/5ML IJ SOLN
INTRAMUSCULAR | Status: DC | PRN
  Administered 2013-02-12: 2 mg via INTRAVENOUS

## 2013-02-12 MED ORDER — SCOPOLAMINE 1 MG/3DAYS TD PT72
MEDICATED_PATCH | TRANSDERMAL | Status: AC
  Filled 2013-02-12: qty 1

## 2013-02-12 MED ORDER — LIDOCAINE HCL (CARDIAC) 20 MG/ML IV SOLN
INTRAVENOUS | Status: DC | PRN
  Administered 2013-02-12: 40 mg via INTRAVENOUS

## 2013-02-12 MED ORDER — SODIUM CHLORIDE 0.9 % IR SOLN
Status: DC | PRN
  Administered 2013-02-12: 1000 mL

## 2013-02-12 MED ORDER — SERTRALINE HCL 100 MG PO TABS
200.0000 mg | ORAL_TABLET | Freq: Every day | ORAL | Status: DC
  Administered 2013-02-12 – 2013-02-14 (×3): 200 mg via ORAL
  Filled 2013-02-12 (×4): qty 2

## 2013-02-12 MED ORDER — ONDANSETRON HCL 4 MG/2ML IJ SOLN
INTRAMUSCULAR | Status: DC | PRN
  Administered 2013-02-12 (×2): 4 mg via INTRAVENOUS

## 2013-02-12 MED ORDER — BUPIVACAINE-EPINEPHRINE 0.25% -1:200000 IJ SOLN
INTRAMUSCULAR | Status: DC | PRN
  Administered 2013-02-12: 6 mL

## 2013-02-12 MED ORDER — POTASSIUM CHLORIDE IN NACL 20-0.9 MEQ/L-% IV SOLN
INTRAVENOUS | Status: DC
  Administered 2013-02-12 – 2013-02-13 (×3): via INTRAVENOUS
  Filled 2013-02-12 (×5): qty 1000

## 2013-02-12 MED ORDER — GLIPIZIDE ER 2.5 MG PO TB24
2.5000 mg | ORAL_TABLET | Freq: Every day | ORAL | Status: DC
  Administered 2013-02-13 – 2013-02-15 (×3): 2.5 mg via ORAL
  Filled 2013-02-12 (×3): qty 1

## 2013-02-12 MED ORDER — LIDOCAINE HCL (CARDIAC) 20 MG/ML IV SOLN
INTRAVENOUS | Status: AC
  Filled 2013-02-12: qty 5

## 2013-02-12 MED ORDER — FENTANYL CITRATE 0.05 MG/ML IJ SOLN
INTRAMUSCULAR | Status: AC
  Filled 2013-02-12: qty 5

## 2013-02-12 MED ORDER — ROCURONIUM BROMIDE 100 MG/10ML IV SOLN
INTRAVENOUS | Status: DC | PRN
  Administered 2013-02-12 (×3): 20 mg via INTRAVENOUS
  Administered 2013-02-12: 10 mg via INTRAVENOUS
  Administered 2013-02-12: 50 mg via INTRAVENOUS

## 2013-02-12 MED ORDER — DILTIAZEM HCL ER COATED BEADS 240 MG PO CP24
240.0000 mg | ORAL_CAPSULE | Freq: Every day | ORAL | Status: DC
  Administered 2013-02-13 – 2013-02-15 (×3): 240 mg via ORAL
  Filled 2013-02-12 (×3): qty 1

## 2013-02-12 MED ORDER — MIDAZOLAM HCL 2 MG/2ML IJ SOLN
INTRAMUSCULAR | Status: AC
  Filled 2013-02-12: qty 2

## 2013-02-12 MED ORDER — LACTATED RINGERS IV SOLN
INTRAVENOUS | Status: DC | PRN
  Administered 2013-02-12 (×2): via INTRAVENOUS

## 2013-02-12 MED ORDER — ENOXAPARIN SODIUM 40 MG/0.4ML ~~LOC~~ SOLN
40.0000 mg | SUBCUTANEOUS | Status: DC
  Administered 2013-02-13 – 2013-02-15 (×3): 40 mg via SUBCUTANEOUS
  Filled 2013-02-12 (×5): qty 0.4

## 2013-02-12 MED ORDER — FENTANYL CITRATE 0.05 MG/ML IJ SOLN
INTRAMUSCULAR | Status: DC | PRN
  Administered 2013-02-12: 50 ug via INTRAVENOUS
  Administered 2013-02-12: 150 ug via INTRAVENOUS
  Administered 2013-02-12: 50 ug via INTRAVENOUS

## 2013-02-12 MED ORDER — GLYCOPYRROLATE 0.2 MG/ML IJ SOLN
INTRAMUSCULAR | Status: DC | PRN
  Administered 2013-02-12: 0.6 mg via INTRAVENOUS

## 2013-02-12 MED ORDER — HYDROMORPHONE HCL PF 1 MG/ML IJ SOLN
0.2500 mg | INTRAMUSCULAR | Status: DC | PRN
  Administered 2013-02-12 (×4): 0.5 mg via INTRAVENOUS

## 2013-02-12 MED ORDER — LEVOTHYROXINE SODIUM 125 MCG PO TABS
125.0000 ug | ORAL_TABLET | Freq: Every day | ORAL | Status: DC
  Administered 2013-02-13 – 2013-02-15 (×3): 125 ug via ORAL
  Filled 2013-02-12 (×5): qty 1

## 2013-02-12 MED ORDER — ONDANSETRON HCL 4 MG/2ML IJ SOLN: 4.0000 mg | Freq: Once | INTRAMUSCULAR | Status: DC | PRN

## 2013-02-12 MED ORDER — PROPOFOL 10 MG/ML IV BOLUS
INTRAVENOUS | Status: AC
  Filled 2013-02-12: qty 20

## 2013-02-12 SURGICAL SUPPLY — 54 items
ADH SKN CLS LQ APL DERMABOND (GAUZE/BANDAGES/DRESSINGS) ×4
APPLIER CLIP LOGIC TI 5 (MISCELLANEOUS) IMPLANT
APPLIER CLIP ROT 10 11.4 M/L (STAPLE) ×3
APR CLP MED LRG 11.4X10 (STAPLE) ×1
APR CLP MED LRG 33X5 (MISCELLANEOUS)
BINDER ABD UNIV 10 28-50 (GAUZE/BANDAGES/DRESSINGS) IMPLANT
BINDER ABD UNIV 12 45-62 (WOUND CARE) IMPLANT
BINDER ABDOM UNIV 10 (GAUZE/BANDAGES/DRESSINGS) ×6
BINDER ABDOMINAL 46IN 62IN (WOUND CARE)
BLADE SURG ROTATE 9660 (MISCELLANEOUS) IMPLANT
CANISTER SUCTION 2500CC (MISCELLANEOUS) ×2 IMPLANT
CHLORAPREP W/TINT 26ML (MISCELLANEOUS) ×1 IMPLANT
CLIP APPLIE ROT 10 11.4 M/L (STAPLE) IMPLANT
COVER SURGICAL LIGHT HANDLE (MISCELLANEOUS) ×3 IMPLANT
DECANTER SPIKE VIAL GLASS SM (MISCELLANEOUS) IMPLANT
DERMABOND ADHESIVE PROPEN (GAUZE/BANDAGES/DRESSINGS) ×8
DERMABOND ADVANCED .7 DNX6 (GAUZE/BANDAGES/DRESSINGS) ×1 IMPLANT
DEVICE SECURE STRAP 25 ABSORB (INSTRUMENTS) ×5 IMPLANT
DEVICE TROCAR PUNCTURE CLOSURE (ENDOMECHANICALS) ×5 IMPLANT
DRAPE UTILITY 15X26 W/TAPE STR (DRAPE) ×6 IMPLANT
ELECT REM PT RETURN 9FT ADLT (ELECTROSURGICAL) ×3
ELECTRODE REM PT RTRN 9FT ADLT (ELECTROSURGICAL) ×1 IMPLANT
FILTER SMOKE EVAC LAPAROSHD (FILTER) ×2 IMPLANT
GLOVE BIO SURGEON STRL SZ7 (GLOVE) ×3 IMPLANT
GLOVE BIO SURGEON STRL SZ7.5 (GLOVE) ×2 IMPLANT
GLOVE BIOGEL PI IND STRL 7.0 (GLOVE) IMPLANT
GLOVE BIOGEL PI IND STRL 7.5 (GLOVE) ×1 IMPLANT
GLOVE BIOGEL PI INDICATOR 7.0 (GLOVE) ×2
GLOVE BIOGEL PI INDICATOR 7.5 (GLOVE) ×4
GLOVE SURG SS PI 7.0 STRL IVOR (GLOVE) ×2 IMPLANT
GOWN STRL NON-REIN LRG LVL3 (GOWN DISPOSABLE) ×11 IMPLANT
KIT BASIN OR (CUSTOM PROCEDURE TRAY) ×3 IMPLANT
KIT ROOM TURNOVER OR (KITS) ×3 IMPLANT
MARKER SKIN DUAL TIP RULER LAB (MISCELLANEOUS) ×3 IMPLANT
MESH VENTRALIGHT ST 8X10 (Mesh General) ×2 IMPLANT
NDL SPNL 22GX3.5 QUINCKE BK (NEEDLE) ×1 IMPLANT
NEEDLE SPNL 22GX3.5 QUINCKE BK (NEEDLE) ×3 IMPLANT
NS IRRIG 1000ML POUR BTL (IV SOLUTION) ×3 IMPLANT
PAD ARMBOARD 7.5X6 YLW CONV (MISCELLANEOUS) ×6 IMPLANT
SCALPEL HARMONIC ACE (MISCELLANEOUS) ×2 IMPLANT
SCISSORS LAP 5X35 DISP (ENDOMECHANICALS) IMPLANT
SET IRRIG TUBING LAPAROSCOPIC (IRRIGATION / IRRIGATOR) ×2 IMPLANT
SLEEVE ENDOPATH XCEL 5M (ENDOMECHANICALS) ×3 IMPLANT
SUT MNCRL AB 4-0 PS2 18 (SUTURE) ×3 IMPLANT
SUT NOVA NAB GS-21 0 18 T12 DT (SUTURE) ×3 IMPLANT
TOWEL OR 17X24 6PK STRL BLUE (TOWEL DISPOSABLE) ×3 IMPLANT
TOWEL OR 17X26 10 PK STRL BLUE (TOWEL DISPOSABLE) ×3 IMPLANT
TRAY FOLEY CATH 14FRSI W/METER (CATHETERS) ×2 IMPLANT
TRAY LAPAROSCOPIC (CUSTOM PROCEDURE TRAY) ×3 IMPLANT
TROCAR 5M 150ML BLDLS (TROCAR) ×2 IMPLANT
TROCAR XCEL BLUNT TIP 100MML (ENDOMECHANICALS) IMPLANT
TROCAR XCEL NON-BLD 11X100MML (ENDOMECHANICALS) ×5 IMPLANT
TROCAR XCEL NON-BLD 5MMX100MML (ENDOMECHANICALS) ×3 IMPLANT
WATER STERILE IRR 1000ML POUR (IV SOLUTION) IMPLANT

## 2013-02-12 NOTE — OR Nursing (Signed)
Copious amounts of white discharge noted in labia/ perineum during foley insertion. Attempted to remove discharge and then cleaned area prior to insertion.

## 2013-02-12 NOTE — Op Note (Signed)
Laparoscopic lysis of adhesions (120 minutes)/ laparoscopic Ventral Hernia Repair Procedure Note  Indications: Symptomatic recurrent ventral hernia  Pre-operative Diagnosis: ventral hernia  Post-operative Diagnosis: ventral hernia  Surgeon: Jerin Franzel K.   Assistants: Sharyn Dross, RNFA  Anesthesia: General endotracheal anesthesia  ASA Class: 3  Procedure Details  The patient was seen in the Holding Room. The risks, benefits, complications, treatment options, and expected outcomes were discussed with the patient. The possibilities of reaction to medication, pulmonary aspiration, perforation of viscus, bleeding, recurrent infection, the need for additional procedures, failure to diagnose a condition, and creating a complication requiring transfusion or operation were discussed with the patient. The patient concurred with the proposed plan, giving informed consent.  The site of surgery properly noted/marked. The patient was taken to the operating room, identified as [name] and the procedure verified as laparoscopic ventral hernia repair with mesh. A Time Out was held and the above information confirmed.    The patient was placed supine.  After establishing general anesthesia, a Foley catheter was placed.  The abdomen was prepped with Betadine because she had some skin tears below her pannus.   We draped in standard fashion using towels and Ioban.  A 5 mm Optiview was used the cannulate the peritoneal cavity in the left upper quadrant below the costal margin.  Pneumoperitoneum was obtained by insufflating CO2, maintaining a maximum pressure of 15 mmHg.  The 5 mm 30-degree laparoscope was inserted.  There were significant omental adhesions to the anterior abdominal wall involving the entire midline abdominal wall.  An 11-mm port was placed in the left anterior axillary line at the level of the umbilicus.  Another 5-mm port was placed in the left lower quadrant.  Harmonic scalpel and  gentle traction were used to dissect the omental adhesions away from the anterior abdominal wall.  We cleared the entire abdominal wall, which took 120 minutes and were able to visualize multiple small fascial defects. There was a single 3 cm defect to the left of the previously placed mesh, but the rest of the swiss-cheese defects were around the sutures that had been used to secure the previous mesh.  We used a spinal needle to identify the extent of the hernia defects.  This covered an area of about 18 cms.  We selected a 25 x 22 piece of Bard Ventralight mesh.  We placed eight stay sutures of 0 Novofil around the edges of the mesh.  The mesh was then rolled up and inserted through the 11 mm port site.  The mesh was then unrolled.  The stay sutures were then pulled up through small stab incisions using the Endo-close device.  This deployed the mesh widely over the fascial defects.  The stay sutures were then tied down.  The Secure Strap device was then used to tack down the edges of the mesh at 1 cm intervals circumferentially. We placed a few tacks inside the outer ring of tacks.  We inspected for hemostasis.  The fascial defect at the 11 mm port site was closed with a 0 Vicryl using the Endoclose device.  Pneumoperitoneum was then released as we removed the remainder of the trocars.  The port sites were closed with 4-0 Monocryl.  All of the incisions and stay suture sites were then sealed with Dermabond.  An abdominal binder was placed around the patient's abdomen.  The patient was extubated and brought to the recovery room in stable condition.  All sponge, instrument, and needle counts were  correct prior to closure and at the conclusion of the case.   Findings: One 3 cm defect to the left of the previous mesh; multiple swiss cheese defects  Estimated Blood Loss:  less than 297 mL         Complications:  None; patient tolerated the procedure well.         Disposition: PACU - hemodynamically stable.          Condition: stable  [name]. Georgette Dover, MD, [facility] Surgery  General/ Trauma Surgery  02/12/2013 5:00 PM

## 2013-02-12 NOTE — Anesthesia Postprocedure Evaluation (Signed)
  Anesthesia Post-op Note  Patient: [name]  Procedure(s) Performed: Procedure(s): LAPAROSCOPIC VENTRAL HERNIA REPAIR  (N/A) INSERTION OF MESH (N/A) LAPAROSCOPIC LYSIS OF ADHESIONS GREATER THAN TWO HOURS (N/A)  Patient Location: PACU  Anesthesia Type:General  Level of Consciousness: awake, alert  and oriented  Airway and Oxygen Therapy: Patient Spontanous Breathing and Patient connected to nasal cannula oxygen  Post-op Pain: mild  Post-op Assessment: Post-op Vital signs reviewed  Post-op Vital Signs: Reviewed  Complications: No apparent anesthesia complications

## 2013-02-12 NOTE — Preoperative (Signed)
Beta Blockers   Reason not to administer Beta Blockers:Not Applicable 

## 2013-02-12 NOTE — H&P (Signed)
Patient ID: [name], female DOB: [birth year], 38 y.o. MRN: [id number]  Chief Complaint   Patient presents with   .  ventral hernia   HPI  [name] is a 38 y.o. female.  HPI  This patient underwent open ventral hernia repair with mesh for a large supraumbilical ventral hernia on 10/03/11. She had a large infected seroma around the mesh that had to be drained. She had to have a diagnostic laparoscopy with drainage of the seroma. She was discharged home on 10/29/11.  Recently the patient had developed some sepsis from a severe kidney infection. She was hospitalized in [place]. CT scan at that time showed the left kidney inflammation but also incidentally noted a small recurrent ventral hernia to the left just above her umbilicus. This contains only some fat. The patient denies any symptoms in this area. Occasionally she gets some twinges of pain in her left lower quadrant but denies any swelling. No digestive symptoms. She feels that she has recovered from her recent bout of urosepsis.  She had a CT scan showing recurrent ventral hernia to the left of midline. This is causing more discomfort, especially when she is on her feet.  Past Medical History   Diagnosis  Date   .  Diabetes mellitus    .  Migraine    .  Hypoactive thyroid    .  Cancer      cervical   .  Osteoporosis    .  Hypertension    .  High cholesterol    .  Anginal pain  2011     "stress per [name]"-occ at present from increased stress   .  Dysrhythmia      hx MVP   .  GERD (gastroesophageal reflux disease)    .  Chronic kidney disease      stones in past, hematuria daily- states hasnt been evaluated due to lack of insurance   .  Psoriasis      scattered throughout body- large amount- states increased with stress of surgery   .  Fatty liver      with gall bladder sludge by pt history/ no stones   .  Stroke      ? subacranoid bleed- no defits   .  Mental disorder      bipolar   .  Anxiety    .   Sleep apnea      STOP BANG SCORE 6   .  Ventral hernia  09/13/2011   .  Depression    .  Shortness of breath      with exertion/ ? adult onset asthma   .  Pneumonia      hx of   .  Arthritis    .  Anemia      hx of anemia during pregnancy   .  PONV (postoperative nausea and vomiting)      also hard to get asleep per pt   .  Bipolar disorder     Past Surgical History   Procedure  Laterality  Date   .  Cesarean section     .  Appendectomy     .  Lipoma       total 3   .  Neuroplasty / transposition median nerve at carpal tunnel bilateral     .  Cervical cancer     .  Kidney stone surgery     .  Ventral hernia repair   10/03/2011  Procedure: HERNIA REPAIR VENTRAL ADULT; Surgeon: [name]. Amanda Carrizales, MD; Location: [facility]; Service: General; Laterality: N/A; open ventral hernia repair with mesh   .  Laparoscopy   10/22/2011     Procedure: LAPAROSCOPY DIAGNOSTIC; Surgeon: [name]. Amanda Dover, MD; Location: [facility]; Service: General; Laterality: N/A;   .  Hernia repair      Family History   Problem  Relation  Age of Onset   .  Hypertension  Mother    .  Glaucoma  Mother    .  Hypertension  Father    .  Cancer  Father      precancerious polyps   .  Cancer  Maternal Uncle      colon   Social History  History   Substance Use Topics   .  Smoking status:  Never Smoker   .  Smokeless tobacco:  Never Used   .  Alcohol Use:  No    Allergies   Allergen  Reactions   .  Cephalexin  Anaphylaxis and Shortness Of Breath     But can tolerate Ceftin and Amoxicillin per patient   .  Cephalexin  Anaphylaxis     But can tolerate Ceftin and Amoxicillin per patient   .  Latex  Itching   .  Lisinopril  Cough and Other (See Comments)     Cough   .  Simvastatin  Other (See Comments)     Back pain   .  Statins  Other (See Comments) and Nausea And Vomiting     Back pain  Back pain   .  Augmentin [Amoxicillin-Pot Clavulanate]  Rash     Other reaction(s): Swelling  Rash, eye swelling  Rash, eye  swelling   .  Tea  Rash    Current Outpatient Prescriptions   Medication  Sig  Dispense  Refill   .  acetaminophen (TYLENOL) 500 MG tablet  500 mg. Take 500 mg by mouth every 6 (six) hours as needed.     Marland Kitchen  acyclovir (ZOVIRAX) 200 MG capsule  Take 400 mg by mouth 2 (two) times daily.     Marland Kitchen  acyclovir (ZOVIRAX) 200 MG capsule  TAKE 2 CAPSULES (400 MG TOTAL) BY MOUTH 2 (TWO) TIMES DAILY.     Marland Kitchen  ALPRAZolam (XANAX) 0.5 MG tablet  Take 1 mg by mouth 3 (three) times daily as needed. For anxiety     .  Armodafinil (NUVIGIL) 250 MG tablet  250 mg. Take 250 mg by mouth daily.     Marland Kitchen  aspirin 81 MG EC tablet  81 mg. Take 81 mg by mouth daily.     .  Biotin 800 MCG TABS  Take by mouth.     .  Cholecalciferol (VITAMIN D) 1000 UNITS capsule  Take 5,000 Units by mouth daily.     .  Cholecalciferol (VITAMIN D3) 5000 UNITS CAPS  Take 1 capsule by mouth daily.     .  ciclopirox (PENLAC) 8 % solution  Apply topically at bedtime. Apply daily on nail and surrounding skin over previous coat. After 7 days remove with alcohol and repeat cycle.     .  cycloSPORINE (SANDIMMUNE) 100 MG capsule  Take 100 mg by mouth every other day.     .  diltiazem (CARDIZEM SR) 120 MG 12 hr capsule  120 mg. Take 1 capsule (120 mg total) by mouth 2 (two) times daily.     Marland Kitchen  doxycycline (VIBRA-TABS) 100 MG tablet  TAKE 1 TABLET TWICE A DAY  60 tablet  2   .  ferrous sulfate 325 (65 FE) MG EC tablet  325 mg. Take 1 tablet (325 mg total) by mouth 2 (two) times daily.     Marland Kitchen  gabapentin (NEURONTIN) 300 MG capsule  TAKE ONE CAPSULE AT BEDTIME     .  glucose blood (ACCU-CHEK AVIVA) test strip  Use as directed     .  HYDROcodone-acetaminophen (NORCO) 10-325 MG per tablet  Take 2 tablets by mouth every 4 (four) hours as needed. As needed for pain.     Marland Kitchen  ibuprofen (ADVIL,MOTRIN) 200 MG tablet  Take 800 mg by mouth every 6 (six) hours as needed. For pain     .  levothyroxine (SYNTHROID, LEVOTHROID) 112 MCG tablet  Take 112 mcg by mouth daily  before breakfast.     .  levothyroxine (SYNTHROID, LEVOTHROID) 125 MCG tablet  125 mcg. Take 125 mcg by mouth daily.     .  metFORMIN (GLUCOPHAGE) 1000 MG tablet  Take 1,000 mg by mouth 2 (two) times daily with a meal.     .  metFORMIN (GLUCOPHAGE) 1000 MG tablet  TAKE 1 TABLET TWICE A DAY WITH FOOD     .  nitrofurantoin, macrocrystal-monohydrate, (MACROBID) 100 MG capsule  Take 100 mg by mouth 2 (two) times daily.     .  ondansetron (ZOFRAN) 4 MG tablet  Take 4 mg by mouth every 8 (eight) hours as needed. For nausea     .  oxybutynin (DITROPAN) 5 MG tablet  5 mg. Take 5 mg by mouth 3 (three) times a day.     .  Prenat-FeFum-FePo-FA-Omega 3 (CONCEPT DHA) 53.5-38-1 MG CAPS  1 tablet. Take 1 tablet by mouth daily.     .  Prenatal Vit-Fe Fumarate-FA (MULTIVITAMIN-PRENATAL) 27-0.8 MG TABS  Take 1 tablet by mouth daily.     .  promethazine (PHENERGAN) 25 MG tablet  TAKE 1 TAB BY MOUTH 30 MIN PRIOR TO IV INFUSION, THEN TAKE 1 TAB EVERY 6 HOURS AS NEEDED FOR NAUSEA     .  QUEtiapine (SEROQUEL) 25 MG tablet  TAKE 1 TABLET AT BEDTIME FOR 1 WEEK THEN INCREASE TO 2 TABLETS AT BEDTIME THEREAFTER     .  sertraline (ZOLOFT) 100 MG tablet  100 mg. Take 100 mg by mouth daily.     .  sertraline (ZOLOFT) 50 MG tablet  Take 50 mg by mouth daily.     .  Sodium Oxybate (XYREM) 500 MG/ML SOLN       No current facility-administered medications for this visit.    Facility-Administered Medications Ordered in Other Visits   Medication  Dose  Route  Frequency  Provider  Last Rate  Last Dose   .  midazolam (VERSED) 5 MG/5ML injection    PRN  Manus Rudd [place] CRNA   2 mg at 08/06/11 [id number]   Review of Systems  Review of Systems  Constitutional: Negative for fever, chills and unexpected weight change.  HENT: Negative for congestion, hearing loss, sore throat, trouble swallowing and voice change.  Eyes: Negative for visual disturbance.  Respiratory: Negative for cough and wheezing.  Cardiovascular: Negative for chest  pain, palpitations and leg swelling.  Gastrointestinal: Positive for abdominal pain and abdominal distention. Negative for nausea, vomiting, diarrhea, constipation, blood in stool and anal bleeding.  Genitourinary: Negative for hematuria, vaginal bleeding and difficulty urinating.  Musculoskeletal: Negative for arthralgias.  Skin: Negative for rash and wound.  Neurological: Negative for seizures, syncope and headaches.  Hematological: Negative for adenopathy. Does not bruise/bleed easily.  Psychiatric/Behavioral: Negative for confusion.  Blood pressure 158/100, pulse 76, resp. rate 14, height 5' 5.5" (1.664 m), weight 313 lb 12.8 oz (142.339 kg).  Physical Exam  Physical Exam  WDWN in NAD  HEENT: EOMI, sclera anicteric  Neck: No masses, no thyromegaly  Lungs: CTA bilaterally; normal respiratory effort  CV: Regular rate and rhythm; no murmurs  Abd: +bowel sounds, soft, healed incision with no midline hernia; small palpable bulge to the left of midline incision - partially reducible  Ext: Well-perfused; no edema  Skin: Warm, dry; no sign of jaundice  Data Reviewed  CLINICAL DATA: Abdominal pain, evaluate for recurrent hernia  EXAM:  CT ABDOMEN AND PELVIS WITH CONTRAST  TECHNIQUE:  Multidetector CT imaging of the abdomen and pelvis was performed  using the standard protocol following bolus administration of  intravenous contrast.  CONTRAST: 134mL OMNIPAQUE IOHEXOL 300 MG/ML SOLN  COMPARISON: 11/09/2011  FINDINGS:  Lung bases are clear.  Suspected hepatic steatosis.  Spleen, pancreas, and adrenal glands are within normal limits.  Gallbladder is unremarkable. No intrahepatic or extrahepatic ductal  dilatation.  Kidneys are within normal limits. No hydronephrosis.  No evidence of bowel obstruction.  No evidence of abdominal aortic aneurysm.  No abdominopelvic ascites.  No suspicious abdominopelvic lymphadenopathy.  Uterus and bilateral ovaries are unremarkable.  Bladder is within  normal limits.  Small fat containing left paramidline ventral hernia (series 3/image  57). Multiple additional tiny fat containing ventral hernias (series  3/images 40, 53, 57, 61-62). No drainable fluid collection/abscess.  Mild degenerative changes of the visualized thoracolumbar spine.  IMPRESSION:  Small fat containing left paramidline ventral hernia. Multiple  additional tiny fat containing ventral hernias.  No drainable fluid collections os abscess.  No evidence of bowel obstruction.  Electronically Signed  By: Julian Hy M.D.  On: 12/03/2012 13:46  Assessment  Recurrent small left paramidline ventral hernia with surrounding swiss-cheese defects - no bowel involvement  Plan  Laparoscopic ventral hernia repair with mesh. The surgical procedure has been discussed with the patient. Potential risks, benefits, alternative treatments, and expected outcomes have been explained. All of the patient's questions at this time have been answered. The likelihood of reaching the patient's treatment goal is good. The patient understand the proposed surgical procedure and wishes to proceed.    [name]. Amanda Dover, MD, [facility] Surgery  General/ Trauma Surgery  02/12/2013 12:35 PM

## 2013-02-12 NOTE — Anesthesia Preprocedure Evaluation (Addendum)
Anesthesia Evaluation  Patient identified by MRN, date of birth, ID band Patient awake    Reviewed: Allergy & Precautions, H&P , NPO status , Patient's Chart, lab work & pertinent test results  History of Anesthesia Complications (+) PONV and Family history of anesthesia reaction  Airway Mallampati: II TM Distance: >3 FB     Dental  (+) Poor Dentition and Dental Advisory Given,    Pulmonary shortness of breath, sleep apnea , pneumonia -, resolved,          Cardiovascular hypertension, Pt. on medications + angina (stress related) with exertion + dysrhythmias     Neuro/Psych  Headaches, Seizures -,  Anxiety Depression CVA    GI/Hepatic Neg liver ROS, GERD-  Medicated and Controlled,  Endo/Other  diabetes, Oral Hypoglycemic AgentsHypothyroidism   Renal/GU Renal InsufficiencyRenal disease     Musculoskeletal   Abdominal   Peds  Hematology  (+) anemia ,   Anesthesia Other Findings   Reproductive/Obstetrics                         Anesthesia Physical Anesthesia Plan  ASA: III  Anesthesia Plan: General   Post-op Pain Management:    Induction: Intravenous  Airway Management Planned: Oral ETT  Additional Equipment:   Intra-op Plan:   Post-operative Plan: Extubation in OR and Possible Post-op intubation/ventilation  Informed Consent: I have reviewed the patients History and Physical, chart, labs and discussed the procedure including the risks, benefits and alternatives for the proposed anesthesia with the patient or authorized representative who has indicated his/her understanding and acceptance.     Plan Discussed with:   Anesthesia Plan Comments:         Anesthesia Quick Evaluation

## 2013-02-12 NOTE — Progress Notes (Signed)
Pt states that she is concerned about meds that she brought with her and wants [place] to lock them up in Pharm. 2 bottles of 500 mg/ml 179ml bottles of Xyrem  sent to the main pharm.  Witnesses include the pt, Shary Decamp , Rn, and myself.

## 2013-02-12 NOTE — Transfer of Care (Signed)
Immediate Anesthesia Transfer of Care Note  Patient: [name]  Procedure(s) Performed: Procedure(s): LAPAROSCOPIC VENTRAL HERNIA REPAIR  (N/A) INSERTION OF MESH (N/A) LAPAROSCOPIC LYSIS OF ADHESIONS GREATER THAN TWO HOURS (N/A)  Patient Location: PACU  Anesthesia Type:General  Level of Consciousness: awake, alert , oriented and patient cooperative  Airway & Oxygen Therapy: Patient Spontanous Breathing and Patient connected to face mask oxygen  Post-op Assessment: Report given to PACU RN and Post -op Vital signs reviewed and stable  Post vital signs: Reviewed and stable  Complications: No apparent anesthesia complications

## 2013-02-13 LAB — GLUCOSE, CAPILLARY
GLUCOSE-CAPILLARY: 149 mg/dL — AB (ref 70–99)
Glucose-Capillary: 173 mg/dL — ABNORMAL HIGH (ref 70–99)
Glucose-Capillary: 140 mg/dL — ABNORMAL HIGH (ref 70–99)
Glucose-Capillary: 140 mg/dL — ABNORMAL HIGH (ref 70–99)

## 2013-02-13 LAB — CBC
HCT: 34 % — ABNORMAL LOW (ref 36.0–46.0)
Hemoglobin: 10.6 g/dL — ABNORMAL LOW (ref 12.0–15.0)
MCH: 28.3 pg (ref 26.0–34.0)
MCHC: 31.2 g/dL (ref 30.0–36.0)
MCV: 90.9 fL (ref 78.0–100.0)
Platelets: 246 10*3/uL (ref 150–400)
RBC: 3.74 MIL/uL — ABNORMAL LOW (ref 3.87–5.11)
RDW: 15.1 % (ref 11.5–15.5)
WBC: 13.3 10*3/uL — ABNORMAL HIGH (ref 4.0–10.5)

## 2013-02-13 LAB — BASIC METABOLIC PANEL
BUN: 12 mg/dL (ref 6–23)
CALCIUM: 8.1 mg/dL — AB (ref 8.4–10.5)
CO2: 24 mEq/L (ref 19–32)
Chloride: 102 mEq/L (ref 96–112)
Creatinine, Ser: 0.73 mg/dL (ref 0.50–1.10)
GFR calc Af Amer: >90 mL/min (ref >90)
Glucose, Bld: 190 mg/dL — ABNORMAL HIGH (ref 70–99)
Potassium: 5 mEq/L (ref 3.7–5.3)
SODIUM: 139 meq/L (ref 137–147)

## 2013-02-13 LAB — HEMOGLOBIN A1C
Hgb A1c MFr Bld: 6.6 % — ABNORMAL HIGH (ref <5.7)
MEAN PLASMA GLUCOSE: 143 mg/dL — AB (ref <117)

## 2013-02-13 MED ORDER — KETOROLAC TROMETHAMINE 30 MG/ML IJ SOLN
30.0000 mg | Freq: Four times a day (QID) | INTRAMUSCULAR | Status: DC
  Administered 2013-02-13 – 2013-02-15 (×8): 30 mg via INTRAVENOUS
  Filled 2013-02-13 (×13): qty 1

## 2013-02-13 NOTE — Clinical Documentation Improvement (Signed)
THIS DOCUMENT IS NOT A PERMANENT PART OF THE MEDICAL RECORD  Please update your documentation with the medical record to reflect your response to this query. If you need help knowing how to do this please call [phone].  02/13/13  Dear Dr. Georgette Dover,  In an effort to better capture your patient's severity of illness, reflect appropriate length of stay and utilization of resources, a review of the patient medical record has revealed the following indicators.   Based on your clinical judgment, please clarify and document in a progress note and/or discharge summary the clinical condition associated with the following supporting information: In responding to this query please exercise your independent judgment.  The fact that a query is asked, does not imply that any particular answer is desired or expected.  Hello Dr. Georgette Dover!  According to the documented Height and Weight in CHL/EPIC, the patients BMI is 50.62. If your clinical findings/judgment agrees with this,if possible could you please help clarify the suspected diagnosis in the progress note and discharge summary. [place] YOU!    [facility]: A diagnosis of UNDERWEIGHT or MORBID OBESITY should have the BMI documented along with it.  Possible Clinical Conditions?  - Morbid Obesity  - Other condition (please document in the progress notes and/or discharge summary)  Supporting Information:  Estimated body mass index is 50.62 kg/(m^2) as calculated from the following:   Height as of this encounter: 5\' 6"  (1.676 m).   Weight as of this encounter: 313 lb 7.9 oz (142.2 kg).      Reviewed:   Thank You,  [place] Documentation Specialist: [place]

## 2013-02-13 NOTE — Progress Notes (Signed)
UR completed.  Kandance Yano, RN BSN MHA CCM Trauma/Neuro ICU Case Manager [phone]  

## 2013-02-13 NOTE — Progress Notes (Addendum)
1 Day Post-Op  Subjective: Sore, but ambulating with assistance. Voiding after Foley removed No nausea or  vomiting  Objective: Vital signs in last 24 hours: Temp:  [97.5 F (36.4 C)-98.8 F (37.1 C)] 98 F (36.7 C) (01/23 1323) Pulse Rate:  [68-91] 89 (01/23 1323) Resp:  [13-22] 18 (01/23 1323) BP: (115-153)/(56-95) 115/76 mmHg (01/23 1323) SpO2:  [93 %-98 %] 96 % (01/23 1323) Weight:  [313 lb 7.9 oz (142.2 kg)] 313 lb 7.9 oz (142.2 kg) (01/22 2300) Last BM Date: 02/12/13  Intake/Output from previous day: 01/22 0701 - 01/23 0700 In: 3506.7 [P.O.:470; I.V.:3036.7] Out: 1420 [Urine:1350; Blood:70] Intake/Output this shift:    General appearance: alert, cooperative and no distress GI: soft, moderately tender around incisions Incisions clean, dry, intact  Lab Results:   Recent Labs  02/13/13 0310  WBC 13.3*  HGB 10.6*  HCT 34.0*  PLT 246   BMET  Recent Labs  02/13/13 0310  NA 139  K 5.0  CL 102  CO2 24  GLUCOSE 190*  BUN 12  CREATININE 0.73  CALCIUM 8.1*   PT/INR No results found for this basename: LABPROT, INR,  in the last 72 hours ABG No results found for this basename: PHART, PCO2, PO2, HCO3,  in the last 72 hours  Studies/Results: Dg Chest 2 View  02/12/2013   CLINICAL DATA:  Pre operative respiratory exam.  Ventral hernia.  EXAM: CHEST  2 VIEW  COMPARISON:  12/05/2010  FINDINGS: The heart size and mediastinal contours are within normal limits. Both lungs are clear. The visualized skeletal structures are unremarkable.  IMPRESSION: Normal chest.   Electronically Signed   By: Rozetta Nunnery M.D.   On: 02/12/2013 14:06    Anti-infectives: Anti-infectives   Start     Dose/Rate Route Frequency Ordered Stop   02/13/13 0130  vancomycin (VANCOCIN) 1,500 mg in sodium chloride 0.9 % 500 mL IVPB     1,500 mg 250 mL/hr over 120 Minutes Intravenous Every 12 hours 02/12/13 1832 02/14/13 0129   02/12/13 0600  vancomycin (VANCOCIN) 1,500 mg in sodium chloride 0.9  % 500 mL IVPB     1,500 mg 250 mL/hr over 120 Minutes Intravenous On call to O.R. 02/11/13 1419 02/12/13 1330      Assessment/Plan: s/p Procedure(s): LAPAROSCOPIC VENTRAL HERNIA REPAIR  (N/A) INSERTION OF MESH (N/A) LAPAROSCOPIC LYSIS OF ADHESIONS GREATER THAN TWO HOURS (N/A) Morbid obesity - BMI 50.6 Diabetes - continue sliding scale  Advance diet Ambulate with assistance  LOS: 1 day    [name] K. 02/13/2013

## 2013-02-14 LAB — BASIC METABOLIC PANEL
BUN: 17 mg/dL (ref 6–23)
CALCIUM: 8.2 mg/dL — AB (ref 8.4–10.5)
CO2: 28 mEq/L (ref 19–32)
CREATININE: 0.76 mg/dL (ref 0.50–1.10)
Chloride: 109 mEq/L (ref 96–112)
GFR calc non Af Amer: >90 mL/min (ref >90)
GLUCOSE: 138 mg/dL — AB (ref 70–99)
Potassium: 4.2 mEq/L (ref 3.7–5.3)
Sodium: 145 mEq/L (ref 137–147)

## 2013-02-14 LAB — CBC
HCT: 29 % — ABNORMAL LOW (ref 36.0–46.0)
Hemoglobin: 8.9 g/dL — ABNORMAL LOW (ref 12.0–15.0)
MCH: 28.5 pg (ref 26.0–34.0)
MCHC: 30.7 g/dL (ref 30.0–36.0)
MCV: 92.9 fL (ref 78.0–100.0)
Platelets: 168 10*3/uL (ref 150–400)
RBC: 3.12 MIL/uL — ABNORMAL LOW (ref 3.87–5.11)
RDW: 15.6 % — AB (ref 11.5–15.5)
WBC: 8.2 10*3/uL (ref 4.0–10.5)

## 2013-02-14 LAB — GLUCOSE, CAPILLARY
Glucose-Capillary: 153 mg/dL — ABNORMAL HIGH (ref 70–99)
Glucose-Capillary: 162 mg/dL — ABNORMAL HIGH (ref 70–99)
Glucose-Capillary: 140 mg/dL — ABNORMAL HIGH (ref 70–99)
Glucose-Capillary: 134 mg/dL — ABNORMAL HIGH (ref 70–99)

## 2013-02-14 MED ORDER — OXYCODONE HCL 5 MG PO TABS
5.0000 mg | ORAL_TABLET | ORAL | Status: DC | PRN
  Administered 2013-02-14 – 2013-02-15 (×4): 10 mg via ORAL
  Filled 2013-02-14 (×5): qty 2

## 2013-02-14 MED ORDER — HYDROMORPHONE HCL PF 1 MG/ML IJ SOLN: 0.5000 mg | INTRAMUSCULAR | Status: DC | PRN

## 2013-02-14 NOTE — Progress Notes (Signed)
Still needing IV pain meds. Wounds look good. Got up and ambulated today already. Home tomorrow if pain can be controlled on orals. Patient examined and I agree with the assessment and plan  Georganna Skeans, MD, MPH, FACS Pager: [phone]  02/14/2013 11:54 AM

## 2013-02-14 NOTE — Progress Notes (Signed)
2 Days Post-Op  Subjective: Pt feels better, ambulating OOB.  On PCA, somewhat foggy and feeling out of it on narcotics.  C/o pain which is controlled.  Tolerating D3 diet.  Urinating well.    Objective: Vital signs in last 24 hours: Temp:  [98 F (36.7 C)-98.5 F (36.9 C)] 98.1 F (36.7 C) (01/24 0609) Pulse Rate:  [76-91] 80 (01/24 0609) Resp:  [18-20] 18 (01/24 0811) BP: (115-138)/(53-76) 130/65 mmHg (01/24 0609) SpO2:  [94 %-98 %] 97 % (01/24 0811) FiO2 (%):  [40 %] 40 % (01/24 0609) Last BM Date: 02/12/13  Intake/Output from previous day: 01/23 0701 - 01/24 0700 In: 1725.3 [I.V.:1725.3] Out: -  Intake/Output this shift:    PE: Gen:  Alert, NAD, pleasant Abd: Morbidly obese, soft, moderate tenderness, ND, +BS, no HSM, multiple tiny laparoscopic incisions sites scattered on the abdomen, incisions C/D/I, psoriasis around umbilicus  Lab Results:   Recent Labs  02/13/13 0310 02/14/13 0530  WBC 13.3* 8.2  HGB 10.6* 8.9*  HCT 34.0* 29.0*  PLT 246 168   BMET  Recent Labs  02/13/13 0310 02/14/13 0530  NA 139 145  K 5.0 4.2  CL 102 109  CO2 24 28  GLUCOSE 190* 138*  BUN 12 17  CREATININE 0.73 0.76  CALCIUM 8.1* 8.2*   PT/INR No results found for this basename: LABPROT, INR,  in the last 72 hours CMP     Component Value Date/Time   NA 145 02/14/2013 0530   K 4.2 02/14/2013 0530   CL 109 02/14/2013 0530   CO2 28 02/14/2013 0530   GLUCOSE 138* 02/14/2013 0530   BUN 17 02/14/2013 0530   CREATININE 0.76 02/14/2013 0530   CREATININE 1.10 10/18/2011 1450   CALCIUM 8.2* 02/14/2013 0530   PROT 5.8* 10/22/2011 1825   ALBUMIN 1.9* 10/22/2011 1825   AST 23 10/22/2011 1825   ALT 11 10/22/2011 1825   ALKPHOS 94 10/22/2011 1825   BILITOT 1.4* 10/22/2011 1825   GFRNONAA >90 02/14/2013 0530   GFRAA >90 02/14/2013 0530   Lipase     Component Value Date/Time   LIPASE 43.0 07/22/2009 1022       Studies/Results: Dg Chest 2 View  02/12/2013   CLINICAL DATA:  Pre operative  respiratory exam.  Ventral hernia.  EXAM: CHEST  2 VIEW  COMPARISON:  12/05/2010  FINDINGS: The heart size and mediastinal contours are within normal limits. Both lungs are clear. The visualized skeletal structures are unremarkable.  IMPRESSION: Normal chest.   Electronically Signed   By: Rozetta Nunnery M.D.   On: 02/12/2013 14:06    Anti-infectives: Anti-infectives   Start     Dose/Rate Route Frequency Ordered Stop   02/13/13 0130  vancomycin (VANCOCIN) 1,500 mg in sodium chloride 0.9 % 500 mL IVPB     1,500 mg 250 mL/hr over 120 Minutes Intravenous Every 12 hours 02/12/13 1832 02/13/13 1437   02/12/13 0600  vancomycin (VANCOCIN) 1,500 mg in sodium chloride 0.9 % 500 mL IVPB     1,500 mg 250 mL/hr over 120 Minutes Intravenous On call to O.R. 02/11/13 1419 02/12/13 1330       Assessment/Plan POD #2 s/p laparoscopic ventral hernia repair with mesh, LOA x 2hr Morbid obesity - BMI 50.6 Diabetes - SSI Psoriasis  Plan: 1.  Pain control, wean IV narcotics to IV PRN and orals 2.  D3 diet 3.  Ambulate OOB, IS 4.  SCD's and lovenox 5.  D/C home when pain better controlled 6.  On home meds     LOS: 2 days    Coralie Keens 02/14/2013, 9:25 AM Pager: [phone]

## 2013-02-15 LAB — GLUCOSE, CAPILLARY
GLUCOSE-CAPILLARY: 121 mg/dL — AB (ref 70–99)
Glucose-Capillary: 158 mg/dL — ABNORMAL HIGH (ref 70–99)

## 2013-02-15 MED ORDER — OXYCODONE HCL 5 MG PO TABS: 40.0000 mg | ORAL_TABLET | Freq: Four times a day (QID) | ORAL | Status: DC | PRN

## 2013-02-15 NOTE — Discharge Instructions (Signed)
NO LIFTING/PUSHING OR PULLING UNTIL CLEARED BY YOUR SURGEON.  WALKING OKAY.  CCS ______[facility] LAPAROSCOPIC SURGERY: POST OP INSTRUCTIONS Always review your discharge instruction sheet given to you by the facility where your surgery was performed. IF YOU HAVE DISABILITY OR FAMILY LEAVE FORMS, YOU MUST BRING THEM TO THE OFFICE FOR PROCESSING.   DO NOT GIVE THEM TO YOUR DOCTOR.  1. A prescription for pain medication may be given to you upon discharge.  Take your pain medication as prescribed, if needed.  If narcotic pain medicine is not needed, then you may take acetaminophen (Tylenol) or ibuprofen (Advil) as needed. 2. Take your usually prescribed medications unless otherwise directed. 3. If you need a refill on your pain medication, please contact your pharmacy.  They will contact our office to request authorization. Prescriptions will not be filled after 5pm or on week-ends. 4. You should follow a light diet the first few days after arrival home, such as soup and crackers, etc.  Be sure to include lots of fluids daily. 5. Most patients will experience some swelling and bruising in the area of the incisions.  Ice packs will help.  Swelling and bruising can take several days to resolve.  6. It is common to experience some constipation if taking pain medication after surgery.  Increasing fluid intake and taking a stool softener (such as Colace) will usually help or prevent this problem from occurring.  A mild laxative (Milk of Magnesia or Miralax) should be taken according to package instructions if there are no bowel movements after 48 hours. 7. Unless discharge instructions indicate otherwise, you may remove your bandages 24-48 hours after surgery, and you may shower at that time.  You may have steri-strips (small skin tapes) in place directly over the incision.  These strips should be left on the skin for 7-10 days.  If your surgeon used skin glue on the incision, you may shower  in 24 hours.  The glue will flake off over the next 2-3 weeks.  Any sutures or staples will be removed at the office during your follow-up visit. 8. ACTIVITIES:  You may resume regular (light) daily activities beginning the next day--such as daily self-care, walking, climbing stairs--gradually increasing activities as tolerated.  You may have sexual intercourse when it is comfortable.  Refrain from any heavy lifting or straining until approved by your doctor. a. You may drive when you are no longer taking prescription pain medication, you can comfortably wear a seatbelt, and you can safely maneuver your car and apply brakes. b. RETURN TO WORK:  __________________________________________________________ 9. You should see your doctor in the office for a follow-up appointment approximately 2-3 weeks after your surgery.  Make sure that you call for this appointment within a day or two after you arrive home to insure a convenient appointment time. 10. OTHER INSTRUCTIONS: __________________________________________________________________________________________________________________________ __________________________________________________________________________________________________________________________ WHEN TO CALL YOUR DOCTOR: 1. Fever over 101.0 2. Inability to urinate 3. Continued bleeding from incision. 4. Increased pain, redness, or drainage from the incision. 5. Increasing abdominal pain  The clinic staff is available to answer your questions during regular business hours.  Please dont hesitate to call and ask to speak to one of the nurses for clinical concerns.  If you have a medical emergency, go to the nearest emergency room or call 911.  A surgeon from [facility] is always on call at the hospital. [address], [place], [place], [place]  [address] ? [address] [address], [place], [place]  [id number] [phone] ? [phone] ? FAX [phone] Web site:  [email or web address]

## 2013-02-15 NOTE — Progress Notes (Signed)
3 Days Post-Op  Subjective: Pt feeling much much better today.  Pain well controlled on only orals and Toradol injection.  No N/V.  Having flatus, but no BM yet.  Tolerating regular diet.  Ambulating well through the halls.  Excited about going home and her quick progress.    Objective: Vital signs in last 24 hours: Temp:  [98.5 F (36.9 C)-99.3 F (37.4 C)] 99.3 F (37.4 C) (01/25 0552) Pulse Rate:  [78-84] 80 (01/25 0552) Resp:  [16-22] 16 (01/25 0552) BP: (115-132)/(51-57) 115/57 mmHg (01/25 0552) SpO2:  [93 %-97 %] 93 % (01/25 0552) Last BM Date: 02/12/13  Intake/Output from previous day: 01/24 0701 - 01/25 0700 In: 1377 [P.O.:240; I.V.:1137] Out: -  Intake/Output this shift:    PE: Gen:  Alert, NAD, pleasant Abd: Morbidly obese, soft, minimal tenderness, ND, +BS, no HSM, multiple tiny laparoscopic incisions sites scattered on the abdomen, incisions C/D/I, psoriasis around umbilicus    Lab Results:   Recent Labs  02/13/13 0310 02/14/13 0530  WBC 13.3* 8.2  HGB 10.6* 8.9*  HCT 34.0* 29.0*  PLT 246 168   BMET  Recent Labs  02/13/13 0310 02/14/13 0530  NA 139 145  K 5.0 4.2  CL 102 109  CO2 24 28  GLUCOSE 190* 138*  BUN 12 17  CREATININE 0.73 0.76  CALCIUM 8.1* 8.2*   PT/INR No results found for this basename: LABPROT, INR,  in the last 72 hours CMP     Component Value Date/Time   NA 145 02/14/2013 0530   K 4.2 02/14/2013 0530   CL 109 02/14/2013 0530   CO2 28 02/14/2013 0530   GLUCOSE 138* 02/14/2013 0530   BUN 17 02/14/2013 0530   CREATININE 0.76 02/14/2013 0530   CREATININE 1.10 10/18/2011 1450   CALCIUM 8.2* 02/14/2013 0530   PROT 5.8* 10/22/2011 1825   ALBUMIN 1.9* 10/22/2011 1825   AST 23 10/22/2011 1825   ALT 11 10/22/2011 1825   ALKPHOS 94 10/22/2011 1825   BILITOT 1.4* 10/22/2011 1825   GFRNONAA >90 02/14/2013 0530   GFRAA >90 02/14/2013 0530   Lipase     Component Value Date/Time   LIPASE 43.0 07/22/2009 1022       Studies/Results: No  results found.  Anti-infectives: Anti-infectives   Start     Dose/Rate Route Frequency Ordered Stop   02/13/13 0130  vancomycin (VANCOCIN) 1,500 mg in sodium chloride 0.9 % 500 mL IVPB     1,500 mg 250 mL/hr over 120 Minutes Intravenous Every 12 hours 02/12/13 1832 02/13/13 1437   02/12/13 0600  vancomycin (VANCOCIN) 1,500 mg in sodium chloride 0.9 % 500 mL IVPB     1,500 mg 250 mL/hr over 120 Minutes Intravenous On call to O.R. 02/11/13 1419 02/12/13 1330       Assessment/Plan POD #3 s/p laparoscopic ventral hernia repair with mesh, LOA x 2hr  Morbid obesity - BMI 50.6  Diabetes - SSI  Psoriasis   Plan:  1. Oral pain control, no longer needing IV pain meds 2. D3 diet  3. Ambulate OOB, IS  4. SCD's and lovenox  5. D/C home today, discussed discharge instructions and need to follow up in 2-3 weeks with Dr. Georgette Dover in the office 6. Recommended stool softener at discharge and healthy soft diet     LOS: 3 days    [name] 02/15/2013, 7:59 AM Pager: [phone]

## 2013-02-15 NOTE — Progress Notes (Signed)
Patient and significant other given discharge instructions.  They verbalized understanding of all instructions and follow-up information.  New prescription given for pain meds.  Pt also given her home meds back that were locked in the main pharmacy.  No questions at this time.  Ready for discharge home.

## 2013-02-16 ENCOUNTER — Encounter (HOSPITAL_COMMUNITY): Payer: Self-pay | Admitting: Surgery

## 2013-02-17 ENCOUNTER — Telehealth (INDEPENDENT_AMBULATORY_CARE_PROVIDER_SITE_OTHER): Payer: Self-pay | Admitting: General Surgery

## 2013-02-17 NOTE — Telephone Encounter (Signed)
Pt called to report she had some blood coming from surgical site.  Site is covered with steri-strip.  Advised ice to site to achieve clotting of vessel and, if possible, can apply direct pressure.  Keep site clean and covered with gauze dressing prn.  Also she will not take her narcotic during the day, because she has small children to look after, so she is taking 800 mg ibuprofen instead.  Reminded her okay to take that amount but only Q8H.  She understands all and will comply.

## 2013-02-18 ENCOUNTER — Encounter (INDEPENDENT_AMBULATORY_CARE_PROVIDER_SITE_OTHER): Payer: Self-pay | Admitting: Surgery

## 2013-02-19 ENCOUNTER — Other Ambulatory Visit (INDEPENDENT_AMBULATORY_CARE_PROVIDER_SITE_OTHER): Payer: Self-pay | Admitting: Surgery

## 2013-02-19 MED ORDER — HYDROMORPHONE HCL 2 MG PO TABS: 2.0000 mg | ORAL_TABLET | Freq: Four times a day (QID) | ORAL | Status: DC | PRN

## 2013-02-19 MED ORDER — PROMETHAZINE HCL 12.5 MG PO TABS: 12.5000 mg | ORAL_TABLET | Freq: Four times a day (QID) | ORAL | Status: DC | PRN

## 2013-02-20 ENCOUNTER — Telehealth (INDEPENDENT_AMBULATORY_CARE_PROVIDER_SITE_OTHER): Payer: Self-pay | Admitting: General Surgery

## 2013-02-20 NOTE — Telephone Encounter (Signed)
LMOM for patient to call back and ask for [name] 

## 2013-02-20 NOTE — Telephone Encounter (Signed)
Called patient husband Eddie Dibbles) and told him per Dr Georgette Dover that she needs to come in per Dr Georgette Dover on Monday 02-23-2013 @ 9:30 am

## 2013-02-23 ENCOUNTER — Encounter (INDEPENDENT_AMBULATORY_CARE_PROVIDER_SITE_OTHER): Payer: Self-pay | Admitting: Surgery

## 2013-02-23 ENCOUNTER — Ambulatory Visit (INDEPENDENT_AMBULATORY_CARE_PROVIDER_SITE_OTHER): Payer: Self-pay | Admitting: Surgery

## 2013-02-23 VITALS — BP 128/69 | HR 78 | Temp 98.1°F | Resp 18 | Ht 66.0 in | Wt 311.4 lb

## 2013-02-23 DIAGNOSIS — K432 Incisional hernia without obstruction or gangrene: Secondary | ICD-10-CM

## 2013-02-23 NOTE — Progress Notes (Signed)
Status post laparoscopic lysis of adhesions and ventral hernia repair with mesh on 02/12/13. She was discharged on 02/15/13.  She has been doing reasonably well. She continues to take pain medicine but this seems to be decreasing. Her bowel movements are regular as long she takes stool softeners. She still has some soreness around her laparoscopic incisions on the left. She has some mild nausea but this seems to be fairly common every time she has surgery. She denies any significant abdominal bloating.  She has one small stay suture site on the right that has some superficial skin separation. There is 1 laparoscopic incision on the left knee her costal margin that also has some superficial skin separation. The large camera port seems to be healing well with no drainage. She should treat these 2 areas with Neosporin and a Band-Aid. We will recheck her in 2 weeks. Continue wearing her abdominal binder.  [name]. Georgette Dover, MD, [facility]  General/ Trauma Surgery  02/23/2013 10:01 AM

## 2013-02-23 NOTE — Discharge Summary (Signed)
Physician Discharge Summary  Patient ID: [name] MRN: [id number] DOB/AGE: [birth year] 38 y.o.  Admit date: 02/12/2013 Discharge date: 02/15/13  Admission Diagnoses:  Discharge Diagnoses:  Active Problems:   Recurrent ventral incisional hernia   Discharged Condition: improved  Hospital Course:  On 02/12/13, she underwent extensive laparoscopic lysis of adhesions and ventral hernia repair with a large sheet of Ventralight mesh.  Her pain was initially managed with a PCA pump, but she was transitioned to PO pain meds by POD 2.  She was ready for discharge by POD3  Consults: None  Significant Diagnostic Studies: none  Treatments: surgery: lap lysis of adhesions and ventral hernia repair with mesh 02/12/13  Discharge Exam: Blood pressure 115/57, pulse 80, temperature 99.3 F (37.4 C), temperature source Oral, resp. rate 16, height 5\' 6"  (1.676 m), weight 313 lb 7.9 oz (142.2 kg), last menstrual period 01/22/2013, SpO2 93.00%. General appearance: alert, cooperative and no distress GI: obese, soft; no sign of recurrent ventral hernia; + BS Laparoscopic incisions c;d;i  Disposition: 01-Home or Self Care   Future Appointments Provider Department Dept Phone   03/10/2013 9:00 AM [name]. Fletcher Ostermiller, [facility], [place] [phone]       Medication List         acetaminophen 500 MG tablet  Commonly known as:  TYLENOL  Take 500 mg by mouth every 6 (six) hours as needed for moderate pain.     acyclovir 200 MG capsule  Commonly known as:  ZOVIRAX  Take 400 mg by mouth 2 (two) times daily.     aspirin 81 MG EC tablet  Take 81 mg by mouth 2 (two) times daily.     B-complex with vitamin C tablet  Take 1 tablet by mouth 2 (two) times daily.     Biotin 1000 MCG tablet  Take 1,000 mcg by mouth daily.     diclofenac 50 MG EC tablet  Commonly known as:  VOLTAREN  Take 50 mg by mouth 2 (two) times daily.     diclofenac sodium 1 % Gel  Commonly known as:  VOLTAREN   Apply 2-4 g topically 4 (four) times daily.     diltiazem 240 MG 24 hr capsule  Commonly known as:  CARDIZEM CD  Take 240 mg by mouth daily with lunch.     ferrous sulfate 325 (65 FE) MG EC tablet  Take 325 mg by mouth daily.     glipiZIDE 2.5 MG 24 hr tablet  Commonly known as:  GLUCOTROL XL  Take 2.5 mg by mouth daily with lunch.     levothyroxine 125 MCG tablet  Commonly known as:  SYNTHROID, LEVOTHROID  Take 125 mcg by mouth daily before breakfast.     metFORMIN 1000 MG tablet  Commonly known as:  GLUCOPHAGE  Take 1,000 mg by mouth 2 (two) times daily with a meal.     NUVIGIL 250 MG tablet  Generic drug:  Armodafinil  Take 250 mg by mouth daily.     OVER THE COUNTER MEDICATION  Take 2 capsules by mouth 2 (two) times daily.     QUEtiapine 100 MG tablet  Commonly known as:  SEROQUEL  Take 100 mg by mouth at bedtime.     ranitidine 150 MG tablet  Commonly known as:  ZANTAC  Take 300 mg by mouth 2 (two) times daily as needed for heartburn.     sertraline 100 MG tablet  Commonly known as:  ZOLOFT  Take 200 mg by  mouth at bedtime.     XYREM 500 MG/ML Soln  Generic drug:  Sodium Oxybate  Take 4,500 mg by mouth See admin instructions. Take first dose before bedtime then wake up and take 2nd dose four hours later.           Follow-up Information   Follow up with Maia Petties., MD. Schedule an appointment as soon as possible for a visit in 2 weeks. (Call to make an appointment for 2-3 weeks from now.)    Specialty:  General Surgery   Contact information:   [address] [place] [address] [phone]       Signed: Maia Petties. 02/23/2013, 11:17 AM

## 2013-02-27 ENCOUNTER — Encounter (INDEPENDENT_AMBULATORY_CARE_PROVIDER_SITE_OTHER): Payer: Medicaid Other | Admitting: Surgery

## 2013-03-10 ENCOUNTER — Encounter (INDEPENDENT_AMBULATORY_CARE_PROVIDER_SITE_OTHER): Payer: Medicaid Other | Admitting: Surgery

## 2013-03-13 ENCOUNTER — Ambulatory Visit (INDEPENDENT_AMBULATORY_CARE_PROVIDER_SITE_OTHER): Payer: Self-pay | Admitting: Surgery

## 2013-03-13 ENCOUNTER — Encounter (INDEPENDENT_AMBULATORY_CARE_PROVIDER_SITE_OTHER): Payer: Self-pay | Admitting: Surgery

## 2013-03-13 VITALS — BP 138/94 | HR 88 | Temp 98.1°F | Resp 15 | Ht 66.0 in | Wt 317.8 lb

## 2013-03-13 DIAGNOSIS — K432 Incisional hernia without obstruction or gangrene: Secondary | ICD-10-CM

## 2013-03-13 NOTE — Progress Notes (Signed)
Patient is here for another wound check. She has 1 laparoscopic site in her left upper quadrant that is improved but not completely healed. There is minimal drainage. No surrounding cellulitis. All of her other incisions are well healed. No sign of recurrent hernia. Her abdominal tenderness is improving. Appetite and bowel movements are normal.  She should continue wearing her abdominal binder until the soreness is completely gone. Neosporin and a Band-Aid to the left upper quadrant laparoscopic site until healed. Followup as needed.  [name]. Georgette Dover, MD, [facility]  General/ Trauma Surgery  03/13/2013 9:53 AM

## 2013-03-20 ENCOUNTER — Telehealth (INDEPENDENT_AMBULATORY_CARE_PROVIDER_SITE_OTHER): Payer: Self-pay | Admitting: General Surgery

## 2013-03-20 NOTE — Telephone Encounter (Signed)
Called patient this morning and she was still in bed and she stated that she felt the same , so I made her an apt to come back in to see Dr Georgette Dover on Monday 03-23-13 @ 11:20. I told her if it gets worse that she may need to go to the ED

## 2013-03-23 ENCOUNTER — Encounter (INDEPENDENT_AMBULATORY_CARE_PROVIDER_SITE_OTHER): Payer: Self-pay | Admitting: Surgery

## 2013-03-23 ENCOUNTER — Ambulatory Visit (INDEPENDENT_AMBULATORY_CARE_PROVIDER_SITE_OTHER): Payer: Self-pay | Admitting: Surgery

## 2013-03-23 VITALS — BP 147/89 | HR 88 | Temp 98.1°F | Resp 18 | Ht 66.0 in | Wt 321.8 lb

## 2013-03-23 DIAGNOSIS — Z8719 Personal history of other diseases of the digestive system: Secondary | ICD-10-CM

## 2013-03-23 DIAGNOSIS — Z9889 Other specified postprocedural states: Secondary | ICD-10-CM

## 2013-03-23 NOTE — Progress Notes (Signed)
The patient was walking upstairs a few days ago and suddenly had a sharp pain on her right side. She has been exquisitely tender in this one area for the last several days. When she is lying perfectly still there is no pain. However when she tries to sit up or move around there is considerable discomfort.  Filed Vitals:   03/23/13 1139  BP: 147/89  Pulse: 88  Temp: 98.1 F (36.7 C)  Resp: 18   All of her incisions have healed with no sign of infection. No palpable masses in the area of tenderness which is on her right side just above the level of the umbilicus. This is in the area of a laparoscopic port as well as a stay suture. I prepped the skin with alcohol and injected 10 cc of 0.25% Marcaine with epinephrine deep into the subcutaneous tissue just anterior to the muscle. I rechecked the patient several minutes later and her pain was significantly improved. She was able to ambulate out of the office without difficulty. I encouraged her to continue using heat as well as when necessary pain medication. We will see her back as needed.  [name]. Georgette Dover, MD, [facility]  General/ Trauma Surgery  03/23/2013 12:54 PM

## 2013-06-09 ENCOUNTER — Ambulatory Visit: Payer: Self-pay | Admitting: *Deleted

## 2013-06-09 ENCOUNTER — Ambulatory Visit: Payer: Medicaid Other | Attending: Internal Medicine | Admitting: Internal Medicine

## 2013-06-09 VITALS — BP 139/82 | HR 82 | Temp 98.2°F | Resp 18 | Ht 66.0 in | Wt 320.6 lb

## 2013-06-09 DIAGNOSIS — N926 Irregular menstruation, unspecified: Secondary | ICD-10-CM

## 2013-06-09 DIAGNOSIS — N920 Excessive and frequent menstruation with regular cycle: Secondary | ICD-10-CM

## 2013-06-09 DIAGNOSIS — F3289 Other specified depressive episodes: Secondary | ICD-10-CM

## 2013-06-09 DIAGNOSIS — L408 Other psoriasis: Secondary | ICD-10-CM | POA: Insufficient documentation

## 2013-06-09 DIAGNOSIS — F329 Major depressive disorder, single episode, unspecified: Secondary | ICD-10-CM

## 2013-06-09 DIAGNOSIS — Z79899 Other long term (current) drug therapy: Secondary | ICD-10-CM | POA: Insufficient documentation

## 2013-06-09 DIAGNOSIS — I10 Essential (primary) hypertension: Secondary | ICD-10-CM

## 2013-06-09 DIAGNOSIS — E119 Type 2 diabetes mellitus without complications: Secondary | ICD-10-CM

## 2013-06-09 DIAGNOSIS — E039 Hypothyroidism, unspecified: Secondary | ICD-10-CM

## 2013-06-09 DIAGNOSIS — Z130 Encounter for screening for diseases of the blood and blood-forming organs and certain disorders involving the immune mechanism: Secondary | ICD-10-CM | POA: Insufficient documentation

## 2013-06-09 DIAGNOSIS — E282 Polycystic ovarian syndrome: Secondary | ICD-10-CM

## 2013-06-09 DIAGNOSIS — E669 Obesity, unspecified: Secondary | ICD-10-CM | POA: Insufficient documentation

## 2013-06-09 DIAGNOSIS — L409 Psoriasis, unspecified: Secondary | ICD-10-CM

## 2013-06-09 DIAGNOSIS — Z139 Encounter for screening, unspecified: Secondary | ICD-10-CM

## 2013-06-09 DIAGNOSIS — Z7982 Long term (current) use of aspirin: Secondary | ICD-10-CM | POA: Insufficient documentation

## 2013-06-09 DIAGNOSIS — G47419 Narcolepsy without cataplexy: Secondary | ICD-10-CM | POA: Insufficient documentation

## 2013-06-09 DIAGNOSIS — F319 Bipolar disorder, unspecified: Secondary | ICD-10-CM

## 2013-06-09 LAB — POCT GLYCOSYLATED HEMOGLOBIN (HGB A1C): Hemoglobin A1C: 6.7

## 2013-06-09 LAB — GLUCOSE, POCT (MANUAL RESULT ENTRY): POC GLUCOSE: 235 mg/dL — AB (ref 70–99)

## 2013-06-09 MED ORDER — GLIPIZIDE ER 2.5 MG PO TB24: 2.5000 mg | ORAL_TABLET | Freq: Every day | ORAL | Status: DC

## 2013-06-09 MED ORDER — LEVOTHYROXINE SODIUM 125 MCG PO TABS: 125.0000 ug | ORAL_TABLET | Freq: Every day | ORAL | Status: DC

## 2013-06-09 MED ORDER — METFORMIN HCL 1000 MG PO TABS: 1000.0000 mg | ORAL_TABLET | Freq: Two times a day (BID) | ORAL | Status: DC

## 2013-06-09 NOTE — Progress Notes (Signed)
Patient is here to establish care for chronic disease management Patient states she lost insurance in Jan 2015 and has run out of most of her meds since that time

## 2013-06-09 NOTE — Progress Notes (Signed)
LCSW met with patient who identified that she has a history of depression diagnosis and bipolar diagnosis.  Patient stated that she stopped medication about 6 months ago.and with the stressors of her son's health and his imminent transition to college, she has felt her mood decline over the past several weeks.  LCSW referred patient to [facility]. Patient can also follow up with this LCSW for additional support.  Christene Lye MSW, LCSW Duration 45 min

## 2013-06-09 NOTE — Patient Instructions (Addendum)
Diabetes Meal Planning Guide The diabetes meal planning guide is a tool to help you plan your meals and snacks. It is important for people with diabetes to manage their blood glucose (sugar) levels. Choosing the right foods and the right amounts throughout your day will help control your blood glucose. Eating right can even help you improve your blood pressure and reach or maintain a healthy weight. CARBOHYDRATE COUNTING MADE EASY When you eat carbohydrates, they turn to sugar. This raises your blood glucose level. Counting carbohydrates can help you control this level so you feel better. When you plan your meals by counting carbohydrates, you can have more flexibility in what you eat and balance your medicine with your food intake. Carbohydrate counting simply means adding up the total amount of carbohydrate grams in your meals and snacks. Try to eat about the same amount at each meal. Foods with carbohydrates are listed below. Each portion below is 1 carbohydrate serving or 15 grams of carbohydrates. Ask your dietician how many grams of carbohydrates you should eat at each meal or snack. Grains and Starches  1 slice bread.   English muffin or hotdog/hamburger bun.   cup cold cereal (unsweetened).   cup cooked pasta or rice.   cup starchy vegetables (corn, potatoes, peas, beans, winter squash).  1 tortilla (6 inches).   bagel.  1 waffle or pancake (size of a CD).   cup cooked cereal.  4 to 6 small crackers. *Whole grain is recommended. Fruit  1 cup fresh unsweetened berries, melon, papaya, pineapple.  1 small fresh fruit.   banana or mango.   cup fruit juice (4 oz unsweetened).   cup canned fruit in natural juice or water.  2 tbs dried fruit.  12 to 15 grapes or cherries. Milk and Yogurt  1 cup fat-free or 1% milk.  1 cup soy milk.  6 oz light yogurt with sugar-free sweetener.  6 oz low-fat soy yogurt.  6 oz plain yogurt. Vegetables  1 cup raw or  cup  cooked is counted as 0 carbohydrates or a "free" food.  If you eat 3 or more servings at 1 meal, count them as 1 carbohydrate serving. Other Carbohydrates   oz chips or pretzels.   cup ice cream or frozen yogurt.   cup sherbet or sorbet.  2 inch square cake, no frosting.  1 tbs honey, sugar, jam, jelly, or syrup.  2 small cookies.  3 squares of graham crackers.  3 cups popcorn.  6 crackers.  1 cup broth-based soup.  Count 1 cup casserole or other mixed foods as 2 carbohydrate servings.  Foods with less than 20 calories in a serving may be counted as 0 carbohydrates or a "free" food. You may want to purchase a book or computer software that lists the carbohydrate gram counts of different foods. In addition, the nutrition facts panel on the labels of the foods you eat are a good source of this information. The label will tell you how big the serving size is and the total number of carbohydrate grams you will be eating per serving. Divide this number by 15 to obtain the number of carbohydrate servings in a portion. Remember, 1 carbohydrate serving equals 15 grams of carbohydrate. SERVING SIZES Measuring foods and serving sizes helps you make sure you are getting the right amount of food. The list below tells how big or small some common serving sizes are.  1 oz.........4 stacked dice.  3 oz.........Deck of cards.  1 tsp........Tip   of little finger.  1 tbs........Thumb.  2 tbs........Golf ball.   cup.......Half of a fist.  1 cup........A fist. SAMPLE DIABETES MEAL PLAN Below is a sample meal plan that includes foods from the grain and starches, dairy, vegetable, fruit, and meat groups. A dietician can individualize a meal plan to fit your calorie needs and tell you the number of servings needed from each food group. However, controlling the total amount of carbohydrates in your meal or snack is more important than making sure you include all of the food groups at every  meal. You may interchange carbohydrate containing foods (dairy, starches, and fruits). The meal plan below is an example of a 2000 calorie diet using carbohydrate counting. This meal plan has 17 carbohydrate servings. Breakfast  1 cup oatmeal (2 carb servings).   cup light yogurt (1 carb serving).  1 cup blueberries (1 carb serving).   cup almonds. Snack  1 large apple (2 carb servings).  1 low-fat string cheese stick. Lunch  Chicken breast salad.  1 cup spinach.   cup chopped tomatoes.  2 oz chicken breast, sliced.  2 tbs low-fat Italian dressing.  12 whole-wheat crackers (2 carb servings).  12 to 15 grapes (1 carb serving).  1 cup low-fat milk (1 carb serving). Snack  1 cup carrots.   cup hummus (1 carb serving). Dinner  3 oz broiled salmon.  1 cup brown rice (3 carb servings). Snack  1  cups steamed broccoli (1 carb serving) drizzled with 1 tsp olive oil and lemon juice.  1 cup light pudding (2 carb servings). DIABETES MEAL PLANNING WORKSHEET Your dietician can use this worksheet to help you decide how many servings of foods and what types of foods are right for you.  BREAKFAST Food Group and Servings / Carb Servings Grain/Starches __________________________________ Dairy __________________________________________ Vegetable ______________________________________ Fruit ___________________________________________ Meat __________________________________________ Fat ____________________________________________ LUNCH Food Group and Servings / Carb Servings Grain/Starches ___________________________________ Dairy ___________________________________________ Fruit ____________________________________________ Meat ___________________________________________ Fat _____________________________________________ DINNER Food Group and Servings / Carb Servings Grain/Starches ___________________________________ Dairy  ___________________________________________ Fruit ____________________________________________ Meat ___________________________________________ Fat _____________________________________________ SNACKS Food Group and Servings / Carb Servings Grain/Starches ___________________________________ Dairy ___________________________________________ Vegetable _______________________________________ Fruit ____________________________________________ Meat ___________________________________________ Fat _____________________________________________ DAILY TOTALS Starches _________________________ Vegetable ________________________ Fruit ____________________________ Dairy ____________________________ Meat ____________________________ Fat ______________________________ Document Released: 10/05/2004 Document Revised: 04/02/2011 Document Reviewed: 08/16/2008 Exit[employer] Patient Information 2014 [employer]. DASH Diet The DASH diet stands for "Dietary Approaches to Stop Hypertension." It is a healthy eating plan that has been shown to reduce high blood pressure (hypertension) in as little as 14 days, while also possibly providing other significant health benefits. These other health benefits include reducing the risk of breast cancer after menopause and reducing the risk of type 2 diabetes, heart disease, colon cancer, and stroke. Health benefits also include weight loss and slowing kidney failure in patients with chronic kidney disease.  DIET GUIDELINES  Limit salt (sodium). Your diet should contain less than 1500 mg of sodium daily.  Limit refined or processed carbohydrates. Your diet should include mostly whole grains. Desserts and added sugars should be used sparingly.  Include small amounts of heart-healthy fats. These types of fats include nuts, oils, and tub margarine. Limit saturated and trans fats. These fats have been shown to be harmful in the body. CHOOSING FOODS  The following food groups  are based on a 2000 calorie diet. See your Registered Dietitian for individual calorie needs. Grains and Grain Products (6 to 8 servings daily)  Eat More Often:   Whole-wheat bread, brown rice, whole-grain or wheat pasta, quinoa, popcorn without added fat or salt (air popped).  Eat Less Often: White bread, white pasta, white rice, cornbread. Vegetables (4 to 5 servings daily)  Eat More Often: Fresh, frozen, and canned vegetables. Vegetables may be raw, steamed, roasted, or grilled with a minimal amount of fat.  Eat Less Often/Avoid: Creamed or fried vegetables. Vegetables in a cheese sauce. Fruit (4 to 5 servings daily)  Eat More Often: All fresh, canned (in natural juice), or frozen fruits. Dried fruits without added sugar. One hundred percent fruit juice ( cup [237 mL] daily).  Eat Less Often: Dried fruits with added sugar. Canned fruit in light or heavy syrup. Lean Meats, Fish, and Poultry (2 servings or less daily. One serving is 3 to 4 oz [85-114 g]).  Eat More Often: Ninety percent or leaner ground beef, tenderloin, sirloin. Round cuts of beef, chicken breast, turkey breast. All fish. Grill, bake, or broil your meat. Nothing should be fried.  Eat Less Often/Avoid: Fatty cuts of meat, turkey, or chicken leg, thigh, or wing. Fried cuts of meat or fish. Dairy (2 to 3 servings)  Eat More Often: Low-fat or fat-free milk, low-fat plain or light yogurt, reduced-fat or part-skim cheese.  Eat Less Often/Avoid: Milk (whole, 2%).Whole milk yogurt. Full-fat cheeses. Nuts, Seeds, and Legumes (4 to 5 servings per week)  Eat More Often: All without added salt.  Eat Less Often/Avoid: Salted nuts and seeds, canned beans with added salt. Fats and Sweets (limited)  Eat More Often: Vegetable oils, tub margarines without trans fats, sugar-free gelatin. Mayonnaise and salad dressings.  Eat Less Often/Avoid: Coconut oils, palm oils, butter, stick margarine, cream, half and half, cookies, candy,  pie. FOR MORE INFORMATION The Dash Diet Eating Plan: www.dashdiet.org Document Released: 12/28/2010 Document Revised: 04/02/2011 Document Reviewed: 12/28/2010 Exit[employer] Patient Information 2014 [employer].  

## 2013-06-09 NOTE — Progress Notes (Signed)
Patient Demographics  [name], is a 38 y.o. female  ION:[id number]  KGM:[id number]  DOB - [birth year]  CC:  Chief Complaint  Patient presents with  . Establish Care  . Diabetes  . Med refills       HPI: [name] is a 38 y.o. female here today to establish medical care. Patient has history of anxiety depression, mood disorder, diabetes, hypertension, narcolepsy, hypothyroidism, psoriasis, as per patient she was seeing her sleep specialist, gynecologist, dermatologist, psychiatrist in the past, for the last few months she has stopped taking all the medications because of insurance issues, currently she denies any acute symptoms she is taking metformin 1 g twice a day her hemoglobin A1c is 6.7% in the office. Patient also has reported to have history of polycystic ovarian syndrome, reported to have irregular menstrual periods and menorrhagia. Patient has No headache, No chest pain, No abdominal pain - No Nausea, No new weakness tingling or numbness, No Cough - SOB.  Allergies  Allergen Reactions  . Cephalexin Anaphylaxis and Shortness Of Breath    But can tolerate Ceftin and Amoxicillin per patient  . Cephalexin Anaphylaxis    But can tolerate Ceftin and Amoxicillin per patient  . Latex Itching  . Lisinopril Cough and Other (See Comments)    Cough  . Simvastatin Other (See Comments)    Back pain  . Statins Other (See Comments) and Nausea And Vomiting    Back pain Back pain   . Augmentin [Amoxicillin-Pot Clavulanate] Rash    Other reaction(s): Swelling Rash, eye swelling Rash, eye swelling  . Tea Rash    tanins in tea    Past Medical History  Diagnosis Date  . Diabetes mellitus   . Migraine   . Hypoactive thyroid   . Cancer     cervical  . Osteoporosis   . Hypertension   . High cholesterol   . Anginal pain 2011    "stress per Dr Pang"-occ at present from increased stress  . GERD (gastroesophageal reflux disease)   . Psoriasis     scattered  throughout body- large amount- states increased with stress of surgery, followed by [name].   . Fatty liver     with gall bladder sludge by pt history/ no stones  . Mental disorder     bipolar  . Anxiety   . Ventral hernia 09/13/2011  . Depression   . Shortness of breath     with exertion/   ? adult onset asthma  . Bipolar disorder   . Family history of anesthesia complication     aggressiveness  . History of stress test 2013    non-specific angina, followed up with stress test, /w [facility], told results were WNL, no need for followup unless she was symptomatic  . Sleep apnea 01/2011     STOP BANG SCORE 6, Sleep study done at [facility]   . Stroke 2007    ? subacranoid bleed-  no defits  . Seizures     febrile seizures as a child  . Anemia     hx of anemia during pregnancy , blood transfusion during hospitalization, for renal calculi /w sepsis   . Arthritis     ankles- recent steroid injections in both ankles, & R wrist   . Dysrhythmia     hx MVP; single episode of PAF in the setting of sepsis 06/2012 (resolved with Cardizem)  . PONV (postoperative nausea and vomiting)  also hard to get asleep per pt; difficult IV access  . Pneumonia     hx of   . Chronic kidney disease     stones in past, hosp. at [facility], 6/14, became septic, PICC line discharged from hosp., for antibiotic Rx   Current Outpatient Prescriptions on File Prior to Visit  Medication Sig Dispense Refill  . acetaminophen (TYLENOL) 500 MG tablet Take 500 mg by mouth every 6 (six) hours as needed for moderate pain.       Marland Kitchen aspirin 81 MG EC tablet Take 81 mg by mouth 2 (two) times daily.       . B Complex-C (B-COMPLEX WITH VITAMIN C) tablet Take 1 tablet by mouth 2 (two) times daily.      . Biotin 1000 MCG tablet Take 1,000 mcg by mouth daily.      . diclofenac sodium (VOLTAREN) 1 % GEL Apply 2-4 g topically 4 (four) times daily.      Marland Kitchen ibuprofen (ADVIL,MOTRIN) 200 MG tablet Take by  mouth.      Marland Kitchen acyclovir (ZOVIRAX) 200 MG capsule Take 400 mg by mouth 2 (two) times daily.       . Armodafinil (NUVIGIL) 250 MG tablet Take 250 mg by mouth daily.       . diclofenac (VOLTAREN) 50 MG EC tablet Take 50 mg by mouth 2 (two) times daily.      Marland Kitchen diltiazem (CARDIZEM CD) 240 MG 24 hr capsule Take 240 mg by mouth daily with lunch.       . ferrous sulfate 325 (65 FE) MG EC tablet Take 325 mg by mouth daily.       Marland Kitchen glipiZIDE (GLUCOTROL XL) 2.5 MG 24 hr tablet Take 2.5 mg by mouth daily with lunch.      Marland Kitchen HYDROmorphone (DILAUDID) 2 MG tablet Take 1 tablet (2 mg total) by mouth every 6 (six) hours as needed for moderate pain.  40 tablet  0  . levothyroxine (SYNTHROID, LEVOTHROID) 125 MCG tablet Take 125 mcg by mouth daily before breakfast.       . metFORMIN (GLUCOPHAGE) 1000 MG tablet Take 1,000 mg by mouth 2 (two) times daily with a meal.      . OVER THE COUNTER MEDICATION Take 2 capsules by mouth 2 (two) times daily.      . promethazine (PHENERGAN) 12.5 MG tablet Take 1 tablet (12.5 mg total) by mouth every 6 (six) hours as needed for nausea or vomiting.  30 tablet  0  . QUEtiapine (SEROQUEL) 100 MG tablet Take 100 mg by mouth at bedtime.      . ranitidine (ZANTAC) 150 MG tablet Take 300 mg by mouth 2 (two) times daily as needed for heartburn.      . sertraline (ZOLOFT) 100 MG tablet Take 200 mg by mouth at bedtime.       . Sodium Oxybate (XYREM) 500 MG/ML SOLN Take 4,500 mg by mouth See admin instructions. Take first dose before bedtime then wake up and take 2nd dose four hours later.       Current Facility-Administered Medications on File Prior to Visit  Medication Dose Route Frequency Provider Last Rate Last Dose  . midazolam (VERSED) 5 MG/5ML injection    PRN Manus Rudd [place] CRNA   2 mg at 08/06/11 0705   Family History  Problem Relation Age of Onset  . Hypertension Mother   . Glaucoma Mother   . Hypertension Father   . Cancer Father  precancerious polyps  . Cancer  Maternal Uncle     colon   History   Social History  . Marital Status: Married    Spouse Name: N/A    Number of Children: N/A  . Years of Education: N/A   Occupational History  . Not on file.   Social History Main Topics  . Smoking status: Never Smoker   . Smokeless tobacco: Never Used  . Alcohol Use: No  . Drug Use: No  . Sexual Activity: Yes   Other Topics Concern  . Not on file   Social History Narrative  . No narrative on file    Review of Systems: Constitutional: Negative for fever, chills, diaphoresis, activity change, appetite change and fatigue. HENT: Negative for ear pain, nosebleeds, congestion, facial swelling, rhinorrhea, neck pain, neck stiffness and ear discharge.  Eyes: Negative for pain, discharge, redness, itching and visual disturbance. Respiratory: Negative for cough, choking, chest tightness, shortness of breath, wheezing and stridor.  Cardiovascular: Negative for chest pain, palpitations and leg swelling. Gastrointestinal: Negative for abdominal distention. Genitourinary: Negative for dysuria, urgency, frequency, hematuria, flank pain, decreased urine volume, difficulty urinating and dyspareunia.  Musculoskeletal: Negative for back pain, joint swelling, arthralgia and gait problem. Neurological: Negative for dizziness, tremors, seizures, syncope, facial asymmetry, speech difficulty, weakness, light-headedness, numbness and headaches.  Hematological: Negative for adenopathy. Does not bruise/bleed easily. Psychiatric/Behavioral: Negative for hallucinations, behavioral problems, confusion, dysphoric mood, decreased concentration and agitation.    Objective:   Filed Vitals:   06/09/13 1109  BP: 139/82  Pulse: 82  Temp: 98.2 F (36.8 C)  Resp: 18    Physical Exam: Constitutional: Obese female sitting comfortably not in acute distress. Her HENT: Normocephalic, atraumatic, External right and left ear normal. Oropharynx is clear and moist.  Eyes:  Conjunctivae and EOM are normal. PERRLA, no scleral icterus. Neck: Normal ROM. Neck supple. No JVD. No tracheal deviation. No thyromegaly. CVS: RRR, S1/S2 +, no murmurs, no gallops, no carotid bruit.  Pulmonary: Effort and breath sounds normal, no stridor, rhonchi, wheezes, rales.  Abdominal: Soft. BS +, no distension, tenderness, rebound or guarding.  Musculoskeletal: Normal range of motion. No edema and no tenderness.  Neuro: Alert. Normal reflexes, muscle tone coordination. No cranial nerve deficit. Skin: Psoriatic patches on dorsal of both forearms  Psychiatric: Normal mood and affect. Behavior, judgment, thought content normal.  Lab Results  Component Value Date   WBC 8.2 02/14/2013   HGB 8.9* 02/14/2013   HCT 29.0* 02/14/2013   MCV 92.9 02/14/2013   PLT 168 02/14/2013   Lab Results  Component Value Date   CREATININE 0.76 02/14/2013   BUN 17 02/14/2013   NA 145 02/14/2013   K 4.2 02/14/2013   CL 109 02/14/2013   CO2 28 02/14/2013    Lab Results  Component Value Date   HGBA1C 6.7 06/09/2013   Lipid Panel  No results found for this basename: chol, trig, hdl, cholhdl, vldl, ldlcalc       Assessment and plan:   1. Diabetes Results for orders placed in visit on 06/09/13  POCT GLYCOSYLATED HEMOGLOBIN (HGB A1C)      Result Value Ref Range   Hemoglobin A1C 6.7    GLUCOSE, POCT (MANUAL RESULT ENTRY)      Result Value Ref Range   POC Glucose 235 (*) 70 - 99 mg/dl   Continue with metformin 1 g twice a day, diabetes meal planning. 2. Psoriasis  - Ambulatory referral to Dermatology  3. BIPOLAR DISORDER UNSPECIFIED/4. DEPRESSION  Patien was taking Seroquel oh, Zoloft  in the past.  - Ambulatory referral to Psychiatry   5. HYPOTHYROIDISM Will repeat TSH level continue with levothyroxine 125 mcg daily. - TSH  6. HYPERTENSION Borderline elevated continue with DASH diet.  7. Irregular menstrual cycle/8. Heavy menses/ 9. Polycystic ovaries  - Ambulatory referral to  Gynecology  10. Narcolepsy Patient was taking nuvigil and xyrem in the past   - Ambulatory referral to Neurology  11. Screening Baseline blood work  - Vit D  25 hydroxy (rtn osteoporosis monitoring) - Lipid panel - COMPLETE METABOLIC PANEL WITH GFR - CBC with Differential   Return in about 3 months (around 09/09/2013) for diabetes, hypertension, Education officer, museum Visit.    Lorayne Marek, MD

## 2013-06-10 ENCOUNTER — Telehealth: Payer: Self-pay

## 2013-06-10 LAB — COMPLETE METABOLIC PANEL WITH GFR
ALT: 26 U/L (ref 0–35)
AST: 24 U/L (ref 0–37)
Albumin: 3.9 g/dL (ref 3.5–5.2)
Alkaline Phosphatase: 76 U/L (ref 39–117)
BUN: 10 mg/dL (ref 6–23)
CO2: 23 mEq/L (ref 19–32)
CREATININE: 0.69 mg/dL (ref 0.50–1.10)
Calcium: 8.5 mg/dL (ref 8.4–10.5)
Chloride: 101 mEq/L (ref 96–112)
GFR, Est African American: >89 mL/min
GFR, Est Non African American: >89 mL/min
Glucose, Bld: 219 mg/dL — ABNORMAL HIGH (ref 70–99)
Potassium: 3.8 mEq/L (ref 3.5–5.3)
SODIUM: 133 meq/L — AB (ref 135–145)
TOTAL PROTEIN: 6.8 g/dL (ref 6.0–8.3)
Total Bilirubin: 0.6 mg/dL (ref 0.2–1.2)

## 2013-06-10 LAB — CBC WITH DIFFERENTIAL/PLATELET
Basophils Absolute: 0.1 10*3/uL (ref 0.0–0.1)
Basophils Relative: 1 % (ref 0–1)
EOS PCT: 3 % (ref 0–5)
Eosinophils Absolute: 0.3 10*3/uL (ref 0.0–0.7)
HEMATOCRIT: 37.3 % (ref 36.0–46.0)
Hemoglobin: 11.9 g/dL — ABNORMAL LOW (ref 12.0–15.0)
LYMPHS ABS: 1.8 10*3/uL (ref 0.7–4.0)
LYMPHS PCT: 21 % (ref 12–46)
MCH: 25.6 pg — ABNORMAL LOW (ref 26.0–34.0)
MCHC: 31.9 g/dL (ref 30.0–36.0)
MCV: 80.4 fL (ref 78.0–100.0)
MONO ABS: 0.5 10*3/uL (ref 0.1–1.0)
Monocytes Relative: 6 % (ref 3–12)
NEUTROS ABS: 5.8 10*3/uL (ref 1.7–7.7)
Neutrophils Relative %: 69 % (ref 43–77)
PLATELETS: 235 10*3/uL (ref 150–400)
RBC: 4.64 MIL/uL (ref 3.87–5.11)
RDW: 15.9 % — ABNORMAL HIGH (ref 11.5–15.5)
WBC: 8.4 10*3/uL (ref 4.0–10.5)

## 2013-06-10 LAB — LIPID PANEL
CHOL/HDL RATIO: 5.3 ratio
Cholesterol: 117 mg/dL (ref 0–200)
HDL: 22 mg/dL — AB (ref >39)
LDL CALC: 47 mg/dL (ref 0–99)
Triglycerides: 239 mg/dL — ABNORMAL HIGH (ref <150)
VLDL: 48 mg/dL — ABNORMAL HIGH (ref 0–40)

## 2013-06-10 LAB — VITAMIN D 25 HYDROXY (VIT D DEFICIENCY, FRACTURES): Vit D, 25-Hydroxy: 36 ng/mL (ref 30–89)

## 2013-06-10 LAB — TSH: TSH: 3.029 u[IU]/mL (ref 0.350–4.500)

## 2013-06-10 NOTE — Telephone Encounter (Signed)
Message copied by Dorothe Pea on Wed Jun 10, 2013 11:38 AM ------      Message from: Lorayne Marek      Created: Wed Jun 10, 2013 10:02 AM       Blood work reviewed noticed elevated glucose and triglycerides, advise patient for low carbohydrate and low-fat diet and start taking her metformin and Glucotrol regularly, continue with dose of levothyroxine we'll repeat TSH level on the next visit, also noticed borderline anemia continue with over-the-counter iron supplement ------

## 2013-06-10 NOTE — Telephone Encounter (Signed)
Called patient to discuss lab results Patient not available Unable to leave message due to voice mail not recording

## 2013-06-11 ENCOUNTER — Other Ambulatory Visit: Payer: Self-pay | Admitting: *Deleted

## 2013-06-11 DIAGNOSIS — B029 Zoster without complications: Secondary | ICD-10-CM

## 2013-06-11 NOTE — Telephone Encounter (Signed)
Patient called in saying she is having an episode but the blisters have not came out yet. Patient is requesting a refill on her Acyclovir. Alverda Skeans, RN

## 2013-06-16 MED ORDER — ACYCLOVIR 200 MG PO CAPS: 400.0000 mg | ORAL_CAPSULE | Freq: Two times a day (BID) | ORAL | Status: DC

## 2013-06-26 ENCOUNTER — Ambulatory Visit: Payer: No Typology Code available for payment source | Attending: Internal Medicine

## 2013-07-09 ENCOUNTER — Telehealth: Payer: Self-pay

## 2013-07-09 ENCOUNTER — Telehealth: Payer: Self-pay | Admitting: Internal Medicine

## 2013-07-09 MED ORDER — GLUCOSE BLOOD VI STRP: ORAL_STRIP | Status: DC

## 2013-07-09 MED ORDER — GLUCOCOM LANCETS 28G MISC: Status: AC

## 2013-07-09 NOTE — Telephone Encounter (Signed)
Pt says Dr did not prescribe/authorize refill for test strips and lancets. Please f/u with pt.

## 2013-07-09 NOTE — Telephone Encounter (Signed)
Patient requested test strips and lancets for her DM [facility]

## 2013-07-29 ENCOUNTER — Encounter: Payer: Self-pay | Admitting: Obstetrics & Gynecology

## 2013-07-30 ENCOUNTER — Encounter: Payer: Self-pay | Admitting: Internal Medicine

## 2013-08-05 ENCOUNTER — Ambulatory Visit: Payer: Self-pay | Admitting: Cardiology

## 2013-09-04 ENCOUNTER — Ambulatory Visit (INDEPENDENT_AMBULATORY_CARE_PROVIDER_SITE_OTHER): Payer: No Typology Code available for payment source | Admitting: Obstetrics & Gynecology

## 2013-09-04 ENCOUNTER — Encounter: Payer: Self-pay | Admitting: Obstetrics & Gynecology

## 2013-09-04 VITALS — BP 130/69 | HR 82 | Temp 97.8°F | Ht 66.0 in | Wt 313.7 lb

## 2013-09-04 DIAGNOSIS — E282 Polycystic ovarian syndrome: Secondary | ICD-10-CM

## 2013-09-04 DIAGNOSIS — N870 Mild cervical dysplasia: Secondary | ICD-10-CM

## 2013-09-04 DIAGNOSIS — N926 Irregular menstruation, unspecified: Secondary | ICD-10-CM

## 2013-09-04 DIAGNOSIS — B029 Zoster without complications: Secondary | ICD-10-CM

## 2013-09-04 MED ORDER — ACYCLOVIR 200 MG PO CAPS: 400.0000 mg | ORAL_CAPSULE | Freq: Two times a day (BID) | ORAL | Status: DC

## 2013-09-04 NOTE — Patient Instructions (Signed)
Dysfunctional Uterine Bleeding Normally, menstrual periods begin between ages 11 to 17 in young women. A normal menstrual cycle/period may begin every 23 days up to 35 days and lasts from 1 to 7 days. Around 12 to 14 days before your menstrual period starts, ovulation (ovary produces an egg) occurs. When counting the time between menstrual periods, count from the first day of bleeding of the previous period to the first day of bleeding of the next period. Dysfunctional (abnormal) uterine bleeding is bleeding that is different from a normal menstrual period. Your periods may come earlier or later than usual. They may be lighter, have blood clots or be heavier. You may have bleeding between periods, or you may skip one period or more. You may have bleeding after sexual intercourse, bleeding after menopause, or no menstrual period. CAUSES   Pregnancy (normal, miscarriage, tubal).  IUDs (intrauterine device, birth control).  Birth control pills.  Hormone treatment.  Menopause.  Infection of the cervix.  Blood clotting problems.  Infection of the inside lining of the uterus.  Endometriosis, inside lining of the uterus growing in the pelvis and other female organs.  Adhesions (scar tissue) inside the uterus.  Obesity or severe weight loss.  Uterine polyps inside the uterus.  Cancer of the vagina, cervix, or uterus.  Ovarian cysts or polycystic ovary syndrome.  Medical problems (diabetes, thyroid disease).  Uterine fibroids (noncancerous tumor).  Problems with your female hormones.  Endometrial hyperplasia, very thick lining and enlarged cells inside of the uterus.  Medicines that interfere with ovulation.  Radiation to the pelvis or abdomen.  Chemotherapy. DIAGNOSIS   Your doctor will discuss the history of your menstrual periods, medicines you are taking, changes in your weight, stress in your life, and any medical problems you may have.  Your doctor will do a physical  and pelvic examination.  Your doctor may want to perform certain tests to make a diagnosis, such as:  Pap test.  Blood tests.  Cultures for infection.  CT scan.  Ultrasound.  Hysteroscopy.  Laparoscopy.  MRI.  Hysterosalpingography.  D and C.  Endometrial biopsy. TREATMENT  Treatment will depend on the cause of the dysfunctional uterine bleeding (DUB). Treatment may include:  Observing your menstrual periods for a couple of months.  Prescribing medicines for medical problems, including:  Antibiotics.  Hormones.  Birth control pills.  Removing an IUD (intrauterine device, birth control).  Surgery:  D and C (scrape and remove tissue from inside the uterus).  Laparoscopy (examine inside the abdomen with a lighted tube).  Uterine ablation (destroy lining of the uterus with electrical current, laser, heat, or freezing).  Hysteroscopy (examine cervix and uterus with a lighted tube).  Hysterectomy (remove the uterus). HOME CARE INSTRUCTIONS   If medicines were prescribed, take exactly as directed. Do not change or switch medicines without consulting your caregiver.  Long term heavy bleeding may result in iron deficiency. Your caregiver may have prescribed iron pills. They help replace the iron that your body lost from heavy bleeding. Take exactly as directed.  Do not take aspirin or medicines that contain aspirin one week before or during your menstrual period. Aspirin may make the bleeding worse.  If you need to change your sanitary pad or tampon more than once every 2 hours, stay in bed with your feet elevated and a cold pack on your lower abdomen. Rest as much as possible, until the bleeding stops or slows down.  Eat well-balanced meals. Eat foods high in iron. Examples   are:  Leafy green vegetables.  Whole-grain breads and cereals.  Eggs.  Meat.  Liver.  Do not try to lose weight until the abnormal bleeding has stopped and your blood iron level is  back to normal. Do not lift more than ten pounds or do strenuous activities when you are bleeding.  For a couple of months, make note on your calendar, marking the start and ending of your period, and the type of bleeding (light, medium, heavy, spotting, clots or missed periods). This is for your caregiver to better evaluate your problem. SEEK MEDICAL CARE IF:   You develop nausea (feeling sick to your stomach) and vomiting, dizziness, or diarrhea while you are taking your medicine.  You are getting lightheaded or weak.  You have any problems that may be related to the medicine you are taking.  You develop pain with your DUB.  You want to remove your IUD.  You want to stop or change your birth control pills or hormones.  You have any type of abnormal bleeding mentioned above.  You are over 16 years old and have not had a menstrual period yet.  You are 38 years old and you are still having menstrual periods.  You have any of the symptoms mentioned above.  You develop a rash. SEEK IMMEDIATE MEDICAL CARE IF:   An oral temperature above 102 F (38.9 C) develops.  You develop chills.  You are changing your sanitary pad or tampon more than once an hour.  You develop abdominal pain.  You pass out or faint. Document Released: 01/06/2000 Document Revised: 04/02/2011 Document Reviewed: 12/07/2008 Exit[employer] Patient Information 2015 [employer]. This information is not intended to replace advice given to you by your health care provider. Make sure you discuss any questions you have with your health care provider.  

## 2013-09-04 NOTE — Progress Notes (Signed)
Patient ID: [name], female   DOB: 01-20-1976, 38 y.o.   MRN: [id number]  Chief Complaint  Patient presents with  . Referral    from [facility]  . PCOS    no period for 58 days. hasn't seen someone in years for this    HPI [name] is a 38 y.o. female.  [id number] Patient's last menstrual period was 07/09/2013. H/O obesity, PCOS, hernia with mesh, sent to discuss irregular menses. Most cycles arre 31-42 days.Uses no BCM  HPI  Past Medical History  Diagnosis Date  . Diabetes mellitus   . Migraine   . Hypoactive thyroid   . Cancer     cervical  . Osteoporosis   . Hypertension   . High cholesterol   . Anginal pain 2011    "stress per Dr Pang"-occ at present from increased stress  . GERD (gastroesophageal reflux disease)   . Psoriasis     scattered throughout body- large amount- states increased with stress of surgery, followed by [name].   . Fatty liver     with gall bladder sludge by pt history/ no stones  . Mental disorder     bipolar  . Anxiety   . Ventral hernia 09/13/2011  . Depression   . Shortness of breath     with exertion/   ? adult onset asthma  . Bipolar disorder   . Family history of anesthesia complication     aggressiveness  . History of stress test 2013    non-specific angina, followed up with stress test, /w [facility], told results were WNL, no need for followup unless she was symptomatic  . Sleep apnea 01/2011     STOP BANG SCORE 6, Sleep study done at [facility]   . Stroke 2007    ? subacranoid bleed-  no defits  . Seizures     febrile seizures as a child  . Anemia     hx of anemia during pregnancy , blood transfusion during hospitalization, for renal calculi /w sepsis   . Arthritis     ankles- recent steroid injections in both ankles, & R wrist   . Dysrhythmia     hx MVP; single episode of PAF in the setting of sepsis 06/2012 (resolved with Cardizem)  . PONV (postoperative nausea and vomiting)      also hard to get asleep per pt; difficult IV access  . Pneumonia     hx of   . Chronic kidney disease     stones in past, hosp. at [facility], 6/14, became septic, PICC line discharged from hosp., for antibiotic Rx    Past Surgical History  Procedure Laterality Date  . Cesarean section  1997& 2011  . Appendectomy    . Lipoma      total 3  . Neuroplasty / transposition median nerve at carpal tunnel bilateral    . Cervical cancer      LEAP procedure  . Kidney stone surgery    . Ventral hernia repair  10/03/2011    Procedure: HERNIA REPAIR VENTRAL ADULT;  Surgeon: [name]. Tsuei, MD;  Location: [facility];  Service: General;  Laterality: N/A;  open ventral hernia repair with mesh  . Laparoscopy  10/22/2011    Procedure: LAPAROSCOPY DIAGNOSTIC;  Surgeon: [name]. Georgette Dover, MD;  Location: [facility];  Service: General;  Laterality: N/A;  . Hernia repair    . Eye surgery  10/2012    blepheroplasty- bilateral   .  Dilation and curettage of uterus      several times   . Ventral hernia repair N/A 02/12/2013    Procedure: LAPAROSCOPIC VENTRAL HERNIA REPAIR ;  Surgeon: [name]. Georgette Dover, MD;  Location: [facility];  Service: General;  Laterality: N/A;  . Insertion of mesh N/A 02/12/2013    Procedure: INSERTION OF MESH;  Surgeon: [name]. Georgette Dover, MD;  Location: [facility];  Service: General;  Laterality: N/A;  . Laparoscopic lysis of adhesions N/A 02/12/2013    Procedure: LAPAROSCOPIC LYSIS OF ADHESIONS GREATER THAN TWO HOURS;  Surgeon: [name]. Georgette Dover, MD;  Location: [facility];  Service: General;  Laterality: N/A;    Family History  Problem Relation Age of Onset  . Hypertension Mother   . Glaucoma Mother   . Hypertension Father   . Cancer Father     precancerious polyps  . Cancer Maternal Uncle     colon    Social History History  Substance Use Topics  . Smoking status: Never Smoker   . Smokeless tobacco: Never Used  . Alcohol Use: No    Allergies  Allergen Reactions  . Cephalexin Anaphylaxis and  Shortness Of Breath    But can tolerate Ceftin and Amoxicillin per patient  . Cephalexin Anaphylaxis    But can tolerate Ceftin and Amoxicillin per patient  . Latex Itching  . Lisinopril Cough and Other (See Comments)    Cough  . Simvastatin Other (See Comments)    Back pain  . Statins Other (See Comments) and Nausea And Vomiting    Back pain Back pain   . Augmentin [Amoxicillin-Pot Clavulanate] Rash    Other reaction(s): Swelling Rash, eye swelling Rash, eye swelling  . Tea Rash    tanins in tea     Current Outpatient Prescriptions  Medication Sig Dispense Refill  . acetaminophen (TYLENOL) 500 MG tablet Take 500 mg by mouth every 6 (six) hours as needed for moderate pain.       Marland Kitchen glipiZIDE (GLUCOTROL XL) 2.5 MG 24 hr tablet Take 1 tablet (2.5 mg total) by mouth daily with lunch.  30 tablet  2  . GlucoCom Lancets MISC As directed TID and QHS  100 each  2  . glucose blood (CHOICE DM FORA G20 TEST STRIPS) test strip Use as instructed  100 each  12  . levothyroxine (SYNTHROID, LEVOTHROID) 125 MCG tablet Take 1 tablet (125 mcg total) by mouth daily before breakfast.  30 tablet  2  . metFORMIN (GLUCOPHAGE) 1000 MG tablet Take 1 tablet (1,000 mg total) by mouth 2 (two) times daily with a meal.  60 tablet  2  . acyclovir (ZOVIRAX) 200 MG capsule Take 2 capsules (400 mg total) by mouth 2 (two) times daily.  30 capsule  0  . ferrous sulfate 325 (65 FE) MG EC tablet Take 325 mg by mouth daily.        No current facility-administered medications for this visit.   Facility-Administered Medications Ordered in Other Visits  Medication Dose Route Frequency Provider Last Rate Last Dose  . midazolam (VERSED) 5 MG/5ML injection    PRN Manus Rudd [place] CRNA   2 mg at 08/06/11 9211    Review of Systems Review of Systems  Constitutional: Negative for unexpected weight change.  Genitourinary: Positive for menstrual problem. Negative for vaginal bleeding, vaginal discharge and pelvic pain.   Skin:       psoriasis    Blood pressure 130/69, pulse 82, temperature 97.8 F (36.6 C), temperature source  Oral, height 5\' 6"  (1.676 m), weight 313 lb 11.2 oz (142.293 kg), last menstrual period 07/09/2013.  Physical Exam Physical Exam  Nursing note and vitals reviewed. Constitutional: She is oriented to person, place, and time. She appears well-developed. No distress.  obese  Pulmonary/Chest: Effort normal. No respiratory distress.  Musculoskeletal: Normal range of motion.  Neurological: She is alert and oriented to person, place, and time.  Skin:  Psoriatic rash elbows  Psychiatric: She has a normal mood and affect. Her behavior is normal.    Data Reviewed Notes, bx results  Assessment    Obesity, DM, PCOS, multiple abdominal surgeries, irregular menses     Plan    Menstrual calendar, report prolonged amenorrhea, consider cycle control, BCM. Discussed health risk associated with pregnancy with her history        Renatta Shrieves 09/04/2013, 10:37 AM

## 2013-09-07 ENCOUNTER — Other Ambulatory Visit: Payer: Self-pay | Admitting: Internal Medicine

## 2013-09-07 DIAGNOSIS — E039 Hypothyroidism, unspecified: Secondary | ICD-10-CM

## 2013-09-07 DIAGNOSIS — E119 Type 2 diabetes mellitus without complications: Secondary | ICD-10-CM

## 2013-09-09 ENCOUNTER — Ambulatory Visit: Payer: Medicaid Other | Admitting: Internal Medicine

## 2013-09-09 ENCOUNTER — Ambulatory Visit: Payer: No Typology Code available for payment source | Attending: Cardiology | Admitting: Cardiology

## 2013-09-09 ENCOUNTER — Ambulatory Visit: Payer: Self-pay | Admitting: Cardiology

## 2013-09-09 ENCOUNTER — Encounter: Payer: Self-pay | Admitting: Cardiology

## 2013-09-09 VITALS — BP 126/82 | HR 87 | Temp 98.3°F | Resp 16 | Ht 66.0 in | Wt 318.0 lb

## 2013-09-09 DIAGNOSIS — Z6841 Body Mass Index (BMI) 40.0 and over, adult: Secondary | ICD-10-CM | POA: Insufficient documentation

## 2013-09-09 DIAGNOSIS — E119 Type 2 diabetes mellitus without complications: Secondary | ICD-10-CM | POA: Insufficient documentation

## 2013-09-09 DIAGNOSIS — G4733 Obstructive sleep apnea (adult) (pediatric): Secondary | ICD-10-CM | POA: Insufficient documentation

## 2013-09-09 DIAGNOSIS — E039 Hypothyroidism, unspecified: Secondary | ICD-10-CM | POA: Insufficient documentation

## 2013-09-09 DIAGNOSIS — E669 Obesity, unspecified: Secondary | ICD-10-CM | POA: Insufficient documentation

## 2013-09-09 DIAGNOSIS — R079 Chest pain, unspecified: Secondary | ICD-10-CM | POA: Insufficient documentation

## 2013-09-09 NOTE — Progress Notes (Signed)
HPI [name] is a very pleasant delightful 38 year old married white female who comes today with chronic chest pain.  The discomfort usually happens at night when she's lying down. It has never happened with exertion. Last evening she had substernal tightness that lasted for about an hour. There were no associated symptoms.  She has a history gastroesophageal reflux. She takes over-the-counter Prilosec. She is very consistent about this.  Her cardiac risk factors include obesity, sleep apnea, hypertension under stress, and diabetes which is well-controlled. She does not smoke or use illicit drugs.  Past Medical History  Diagnosis Date  . Diabetes mellitus   . Migraine   . Hypoactive thyroid   . Cancer     cervical  . Osteoporosis   . Hypertension   . High cholesterol   . Anginal pain 2011    "stress per Dr Pang"-occ at present from increased stress  . GERD (gastroesophageal reflux disease)   . Psoriasis     scattered throughout body- large amount- states increased with stress of surgery, followed by [name].   . Fatty liver     with gall bladder sludge by pt history/ no stones  . Mental disorder     bipolar  . Anxiety   . Ventral hernia 09/13/2011  . Depression   . Shortness of breath     with exertion/   ? adult onset asthma  . Bipolar disorder   . Family history of anesthesia complication     aggressiveness  . History of stress test 2013    non-specific angina, followed up with stress test, /w [facility], told results were WNL, no need for followup unless she was symptomatic  . Sleep apnea 01/2011     STOP BANG SCORE 6, Sleep study done at [facility]   . Stroke 2007    ? subacranoid bleed-  no defits  . Seizures     febrile seizures as a child  . Anemia     hx of anemia during pregnancy , blood transfusion during hospitalization, for renal calculi /w sepsis   . Arthritis     ankles- recent steroid injections in both ankles, & R wrist   .  Dysrhythmia     hx MVP; single episode of PAF in the setting of sepsis 06/2012 (resolved with Cardizem)  . PONV (postoperative nausea and vomiting)     also hard to get asleep per pt; difficult IV access  . Pneumonia     hx of   . Chronic kidney disease     stones in past, hosp. at [facility], 6/14, became septic, PICC line discharged from hosp., for antibiotic Rx    Current Outpatient Prescriptions  Medication Sig Dispense Refill  . acyclovir (ZOVIRAX) 200 MG capsule Take 2 capsules (400 mg total) by mouth 2 (two) times daily.  30 capsule  0  . GLIPIZIDE XL 2.5 MG 24 hr tablet TAKE 1 TABLET BY MOUTH DAILY WITH LUNCH.  30 tablet  2  . metFORMIN (GLUCOPHAGE) 1000 MG tablet TAKE 1 TABLET BY MOUTH 2 TIMES DAILY WITH A MEAL.  60 tablet  2  . SYNTHROID 125 MCG tablet TAKE 1 TABLET BY MOUTH DAILY BEFORE BREAKFAST.  30 tablet  2  . acetaminophen (TYLENOL) 500 MG tablet Take 500 mg by mouth every 6 (six) hours as needed for moderate pain.       . ferrous sulfate 325 (65 FE) MG EC tablet Take 325 mg by mouth daily.       Marland Kitchen  GlucoCom Lancets MISC As directed TID and QHS  100 each  2  . glucose blood (CHOICE DM FORA G20 TEST STRIPS) test strip Use as instructed  100 each  12   No current facility-administered medications for this visit.   Facility-Administered Medications Ordered in Other Visits  Medication Dose Route Frequency Provider Last Rate Last Dose  . midazolam (VERSED) 5 MG/5ML injection    PRN Manus Rudd [place] CRNA   2 mg at 08/06/11 0705    Allergies  Allergen Reactions  . Cephalexin Anaphylaxis and Shortness Of Breath    But can tolerate Ceftin and Amoxicillin per patient  . Cephalexin Anaphylaxis    But can tolerate Ceftin and Amoxicillin per patient  . Latex Itching  . Lisinopril Cough and Other (See Comments)    Cough  . Simvastatin Other (See Comments)    Back pain  . Statins Other (See Comments) and Nausea And Vomiting    Back pain Back pain   . Augmentin  [Amoxicillin-Pot Clavulanate] Rash    Other reaction(s): Swelling Rash, eye swelling Rash, eye swelling  . Tea Rash    tanins in tea     Family History  Problem Relation Age of Onset  . Hypertension Mother   . Glaucoma Mother   . Hypertension Father   . Cancer Father     precancerious polyps  . Cancer Maternal Uncle     colon    History   Social History  . Marital Status: Married    Spouse Name: N/A    Number of Children: N/A  . Years of Education: N/A   Occupational History  . Not on file.   Social History Main Topics  . Smoking status: Never Smoker   . Smokeless tobacco: Never Used  . Alcohol Use: No  . Drug Use: No  . Sexual Activity: Yes   Other Topics Concern  . Not on file   Social History Narrative  . No narrative on file    ROS ALL NEGATIVE EXCEPT THOSE NOTED IN HPI  PE  General Appearance: well developed, well nourished in no acute distress HEENT: symmetrical face, PERRLA, good dentition  Neck: no JVD, thyromegaly, or adenopathy, trachea midline Chest: symmetric without deformity Cardiac: PMI non-displaced, RRR, normal S1, S2, no gallop or murmur Lung: clear to ausculation and percussion Vascular: all pulses full without bruits  Abdominal: nondistended, nontender, good bowel sounds, no HSM, no bruits Extremities: no cyanosis, clubbing or edema, no sign of DVT, no varicosities  Skin: normal color, no rashes Neuro: alert and oriented x 3, non-focal Pysch: normal affect  EKG Previous EKGs from January of this year reviewed and are normal.  Chest x-ray from January 2015 was normal. BMET    Component Value Date/Time   NA 133* 06/09/2013 1240   K 3.8 06/09/2013 1240   CL 101 06/09/2013 1240   CO2 23 06/09/2013 1240   GLUCOSE 219* 06/09/2013 1240   BUN 10 06/09/2013 1240   CREATININE 0.69 06/09/2013 1240   CREATININE 0.76 02/14/2013 0530   CALCIUM 8.5 06/09/2013 1240   GFRNONAA >89 06/09/2013 1240   GFRNONAA >90 02/14/2013 0530   GFRAA >89  06/09/2013 1240   GFRAA >90 02/14/2013 0530    Lipid Panel     Component Value Date/Time   CHOL 117 06/09/2013 1240   TRIG 239* 06/09/2013 1240   HDL 22* 06/09/2013 1240   CHOLHDL 5.3 06/09/2013 1240   VLDL 48* 06/09/2013 1240   LDLCALC 47 06/09/2013 1240  CBC    Component Value Date/Time   WBC 8.4 06/09/2013 1240   RBC 4.64 06/09/2013 1240   HGB 11.9* 06/09/2013 1240   HCT 37.3 06/09/2013 1240   PLT 235 06/09/2013 1240   MCV 80.4 06/09/2013 1240   MCH 25.6* 06/09/2013 1240   MCHC 31.9 06/09/2013 1240   RDW 15.9* 06/09/2013 1240   LYMPHSABS 1.8 06/09/2013 1240   MONOABS 0.5 06/09/2013 1240   EOSABS 0.3 06/09/2013 1240   BASOSABS 0.1 06/09/2013 1240

## 2013-09-09 NOTE — Assessment & Plan Note (Signed)
This is most likely from gastroesophageal reflux and esophageal spasm. I've recommended she continue to sleep with her head elevated, take daily Prilosec and try to sleep on her right side. Reassurance given this is not likely cardiac in etiology. I'll see her back when necessary. Encouraged her to exercise, lose weight, and keep her blood sugar well controlled.

## 2013-09-09 NOTE — Progress Notes (Signed)
Pt comes in to see Dr. Verl Blalock with c/o chest pain and heart palpitations x 5 yrs intermit. Pt states last episode was last night lasting 1 hour squeezing mid sternal,nonradiating Pt denies pain at this time No c/o sob or swelling Last EKG-

## 2013-09-16 ENCOUNTER — Encounter: Payer: Self-pay | Admitting: Internal Medicine

## 2013-09-16 ENCOUNTER — Ambulatory Visit: Payer: No Typology Code available for payment source | Attending: Internal Medicine | Admitting: Internal Medicine

## 2013-09-16 ENCOUNTER — Ambulatory Visit: Payer: No Typology Code available for payment source | Attending: Internal Medicine

## 2013-09-16 VITALS — BP 139/83 | HR 85 | Temp 98.5°F | Resp 16 | Wt 318.6 lb

## 2013-09-16 DIAGNOSIS — E039 Hypothyroidism, unspecified: Secondary | ICD-10-CM | POA: Insufficient documentation

## 2013-09-16 DIAGNOSIS — IMO0001 Reserved for inherently not codable concepts without codable children: Secondary | ICD-10-CM

## 2013-09-16 DIAGNOSIS — E782 Mixed hyperlipidemia: Secondary | ICD-10-CM | POA: Insufficient documentation

## 2013-09-16 DIAGNOSIS — E089 Diabetes mellitus due to underlying condition without complications: Secondary | ICD-10-CM

## 2013-09-16 DIAGNOSIS — E139 Other specified diabetes mellitus without complications: Secondary | ICD-10-CM | POA: Insufficient documentation

## 2013-09-16 DIAGNOSIS — R030 Elevated blood-pressure reading, without diagnosis of hypertension: Secondary | ICD-10-CM | POA: Insufficient documentation

## 2013-09-16 LAB — POCT GLYCOSYLATED HEMOGLOBIN (HGB A1C)

## 2013-09-16 LAB — GLUCOSE, POCT (MANUAL RESULT ENTRY): POC GLUCOSE: 230 mg/dL — AB (ref 70–99)

## 2013-09-16 NOTE — Patient Instructions (Addendum)
Diabetes Mellitus and Food It is important for you to manage your blood sugar (glucose) level. Your blood glucose level can be greatly affected by what you eat. Eating healthier foods in the appropriate amounts throughout the day at about the same time each day will help you control your blood glucose level. It can also help slow or prevent worsening of your diabetes mellitus. Healthy eating may even help you improve the level of your blood pressure and reach or maintain a healthy weight.  HOW CAN FOOD AFFECT ME? Carbohydrates Carbohydrates affect your blood glucose level more than any other type of food. Your dietitian will help you determine how many carbohydrates to eat at each meal and teach you how to count carbohydrates. Counting carbohydrates is important to keep your blood glucose at a healthy level, especially if you are using insulin or taking certain medicines for diabetes mellitus. Alcohol Alcohol can cause sudden decreases in blood glucose (hypoglycemia), especially if you use insulin or take certain medicines for diabetes mellitus. Hypoglycemia can be a life-threatening condition. Symptoms of hypoglycemia (sleepiness, dizziness, and disorientation) are similar to symptoms of having too much alcohol.  If your health care provider has given you approval to drink alcohol, do so in moderation and use the following guidelines:  Women should not have more than one drink per day, and men should not have more than two drinks per day. One drink is equal to:  12 oz of beer.  5 oz of wine.  1 oz of hard liquor.  Do not drink on an empty stomach.  Keep yourself hydrated. Have water, diet soda, or unsweetened iced tea.  Regular soda, juice, and other mixers might contain a lot of carbohydrates and should be counted. WHAT FOODS ARE NOT RECOMMENDED? As you make food choices, it is important to remember that all foods are not the same. Some foods have fewer nutrients per serving than other  foods, even though they might have the same number of calories or carbohydrates. It is difficult to get your body what it needs when you eat foods with fewer nutrients. Examples of foods that you should avoid that are high in calories and carbohydrates but low in nutrients include:  Trans fats (most processed foods list trans fats on the Nutrition Facts label).  Regular soda.  Juice.  Candy.  Sweets, such as cake, pie, doughnuts, and cookies.  Fried foods. WHAT FOODS CAN I EAT? Have nutrient-rich foods, which will nourish your body and keep you healthy. The food you should eat also will depend on several factors, including:  The calories you need.  The medicines you take.  Your weight.  Your blood glucose level.  Your blood pressure level.  Your cholesterol level. You also should eat a variety of foods, including:  Protein, such as meat, poultry, fish, tofu, nuts, and seeds (lean animal proteins are best).  Fruits.  Vegetables.  Dairy products, such as milk, cheese, and yogurt (low fat is best).  Breads, grains, pasta, cereal, rice, and beans.  Fats such as olive oil, trans fat-free margarine, canola oil, avocado, and olives. DOES EVERYONE WITH DIABETES MELLITUS HAVE THE SAME MEAL PLAN? Because every person with diabetes mellitus is different, there is not one meal plan that works for everyone. It is very important that you meet with a dietitian who will help you create a meal plan that is just right for you. Document Released: 10/05/2004 Document Revised: 01/13/2013 Document Reviewed: 12/05/2012 Exit[employer] Patient Information 2015 [employer]. This   information is not intended to replace advice given to you by your health care provider. Make sure you discuss any questions you have with your health care provider. DASH Eating Plan DASH stands for "Dietary Approaches to Stop Hypertension." The DASH eating plan is a healthy eating plan that has been shown to reduce high  blood pressure (hypertension). Additional health benefits may include reducing the risk of type 2 diabetes mellitus, heart disease, and stroke. The DASH eating plan may also help with weight loss. WHAT DO I NEED TO KNOW ABOUT THE DASH EATING PLAN? For the DASH eating plan, you will follow these general guidelines:  Choose foods with a percent daily value for sodium of less than 5% (as listed on the food label).  Use salt-free seasonings or herbs instead of table salt or sea salt.  Check with your health care provider or pharmacist before using salt substitutes.  Eat lower-sodium products, often labeled as "lower sodium" or "no salt added."  Eat fresh foods.  Eat more vegetables, fruits, and low-fat dairy products.  Choose whole grains. Look for the word "whole" as the first word in the ingredient list.  Choose fish and skinless chicken or turkey more often than red meat. Limit fish, poultry, and meat to 6 oz (170 g) each day.  Limit sweets, desserts, sugars, and sugary drinks.  Choose heart-healthy fats.  Limit cheese to 1 oz (28 g) per day.  Eat more home-cooked food and less restaurant, buffet, and fast food.  Limit fried foods.  Cook foods using methods other than frying.  Limit canned vegetables. If you do use them, rinse them well to decrease the sodium.  When eating at a restaurant, ask that your food be prepared with less salt, or no salt if possible. WHAT FOODS CAN I EAT? Seek help from a dietitian for individual calorie needs. Grains Whole grain or whole wheat bread. Brown rice. Whole grain or whole wheat pasta. Quinoa, bulgur, and whole grain cereals. Low-sodium cereals. Corn or whole wheat flour tortillas. Whole grain cornbread. Whole grain crackers. Low-sodium crackers. Vegetables Fresh or frozen vegetables (raw, steamed, roasted, or grilled). Low-sodium or reduced-sodium tomato and vegetable juices. Low-sodium or reduced-sodium tomato sauce and paste. Low-sodium  or reduced-sodium canned vegetables.  Fruits All fresh, canned (in natural juice), or frozen fruits. Meat and Other Protein Products Ground beef (85% or leaner), grass-fed beef, or beef trimmed of fat. Skinless chicken or turkey. Ground chicken or turkey. Pork trimmed of fat. All fish and seafood. Eggs. Dried beans, peas, or lentils. Unsalted nuts and seeds. Unsalted canned beans. Dairy Low-fat dairy products, such as skim or 1% milk, 2% or reduced-fat cheeses, low-fat ricotta or cottage cheese, or plain low-fat yogurt. Low-sodium or reduced-sodium cheeses. Fats and Oils Tub margarines without trans fats. Light or reduced-fat mayonnaise and salad dressings (reduced sodium). Avocado. Safflower, olive, or canola oils. Natural peanut or almond butter. Other Unsalted popcorn and pretzels. The items listed above may not be a complete list of recommended foods or beverages. Contact your dietitian for more options. WHAT FOODS ARE NOT RECOMMENDED? Grains White bread. White pasta. White rice. Refined cornbread. Bagels and croissants. Crackers that contain trans fat. Vegetables Creamed or fried vegetables. Vegetables in a cheese sauce. Regular canned vegetables. Regular canned tomato sauce and paste. Regular tomato and vegetable juices. Fruits Dried fruits. Canned fruit in light or heavy syrup. Fruit juice. Meat and Other Protein Products Fatty cuts of meat. Ribs, chicken wings, bacon, sausage, bologna, salami, chitterlings, fatback, hot   dogs, bratwurst, and packaged luncheon meats. Salted nuts and seeds. Canned beans with salt. Dairy Whole or 2% milk, cream, half-and-half, and cream cheese. Whole-fat or sweetened yogurt. Full-fat cheeses or blue cheese. Nondairy creamers and whipped toppings. Processed cheese, cheese spreads, or cheese curds. Condiments Onion and garlic salt, seasoned salt, table salt, and sea salt. Canned and packaged gravies. Worcestershire sauce. Tartar sauce. Barbecue sauce.  Teriyaki sauce. Soy sauce, including reduced sodium. Steak sauce. Fish sauce. Oyster sauce. Cocktail sauce. Horseradish. Ketchup and mustard. Meat flavorings and tenderizers. Bouillon cubes. Hot sauce. Tabasco sauce. Marinades. Taco seasonings. Relishes. Fats and Oils Butter, stick margarine, lard, shortening, ghee, and bacon fat. Coconut, palm kernel, or palm oils. Regular salad dressings. Other Pickles and olives. Salted popcorn and pretzels. The items listed above may not be a complete list of foods and beverages to avoid. Contact your dietitian for more information. WHERE CAN I FIND MORE INFORMATION? National Heart, Lung, and [employer] Institute: www.nhlbi.nih.gov/health/health-topics/topics/dash/ Document Released: 12/28/2010 Document Revised: 05/25/2013 Document Reviewed: 11/12/2012 ExitCare Patient Information 2015 [employer]. This information is not intended to replace advice given to you by your health care provider. Make sure you discuss any questions you have with your health care provider.  

## 2013-09-16 NOTE — Progress Notes (Signed)
MRN: [id number] Name: [name]  Sex: female Age: 38 y.o. DOB: Mar 14, 1975  Allergies: Cephalexin; Cephalexin; Latex; Lisinopril; Simvastatin; Statins; Augmentin; and Tea  Chief Complaint  Patient presents with  . Follow-up    HPI: Patient is 38 y.o. female who has history of diabetes, hypothyroidism comes today for followup, she had a blood work done which was reviewed with the patient noticed elevated triglycerides, also her hemoglobin A1c has trended up as per patient in July she saw her psychiatrist and was put on latuda as per patient then she noticed her blood sugar was running in 300 range, and then she stopped taking medication already has scheduled appointment with her psychiatrist as per patient she also has history of ADHD and wants some medication for that, I have advised patient to follow with psychiatrist for further evaluation.  Past Medical History  Diagnosis Date  . Diabetes mellitus   . Migraine   . Hypoactive thyroid   . Cancer     cervical  . Osteoporosis   . Hypertension   . High cholesterol   . Anginal pain 2011    "stress per Dr Pang"-occ at present from increased stress  . GERD (gastroesophageal reflux disease)   . Psoriasis     scattered throughout body- large amount- states increased with stress of surgery, followed by [name].   . Fatty liver     with gall bladder sludge by pt history/ no stones  . Mental disorder     bipolar  . Anxiety   . Ventral hernia 09/13/2011  . Depression   . Shortness of breath     with exertion/   ? adult onset asthma  . Bipolar disorder   . Family history of anesthesia complication     aggressiveness  . History of stress test 2013    non-specific angina, followed up with stress test, /w [facility], told results were WNL, no need for followup unless she was symptomatic  . Sleep apnea 01/2011     STOP BANG SCORE 6, Sleep study done at [facility]   . Stroke 2007    ? subacranoid  bleed-  no defits  . Seizures     febrile seizures as a child  . Anemia     hx of anemia during pregnancy , blood transfusion during hospitalization, for renal calculi /w sepsis   . Arthritis     ankles- recent steroid injections in both ankles, & R wrist   . Dysrhythmia     hx MVP; single episode of PAF in the setting of sepsis 06/2012 (resolved with Cardizem)  . PONV (postoperative nausea and vomiting)     also hard to get asleep per pt; difficult IV access  . Pneumonia     hx of   . Chronic kidney disease     stones in past, hosp. at [facility], 6/14, became septic, PICC line discharged from hosp., for antibiotic Rx    Past Surgical History  Procedure Laterality Date  . Cesarean section  1997& 2011  . Appendectomy    . Lipoma      total 3  . Neuroplasty / transposition median nerve at carpal tunnel bilateral    . Cervical cancer      LEAP procedure  . Kidney stone surgery    . Ventral hernia repair  10/03/2011    Procedure: HERNIA REPAIR VENTRAL ADULT;  Surgeon: [name]. Georgette Dover, MD;  Location: [facility];  Service: General;  Laterality: N/A;  open ventral hernia repair with mesh  . Laparoscopy  10/22/2011    Procedure: LAPAROSCOPY DIAGNOSTIC;  Surgeon: [name]. Georgette Dover, MD;  Location: [facility];  Service: General;  Laterality: N/A;  . Hernia repair    . Eye surgery  10/2012    blepheroplasty- bilateral   . Dilation and curettage of uterus      several times   . Ventral hernia repair N/A 02/12/2013    Procedure: LAPAROSCOPIC VENTRAL HERNIA REPAIR ;  Surgeon: [name]. Georgette Dover, MD;  Location: [facility];  Service: General;  Laterality: N/A;  . Insertion of mesh N/A 02/12/2013    Procedure: INSERTION OF MESH;  Surgeon: [name]. Georgette Dover, MD;  Location: [facility];  Service: General;  Laterality: N/A;  . Laparoscopic lysis of adhesions N/A 02/12/2013    Procedure: LAPAROSCOPIC LYSIS OF ADHESIONS GREATER THAN TWO HOURS;  Surgeon: [name]. Tsuei, MD;  Location: [facility];  Service: General;  Laterality: N/A;       Medication List       This list is accurate as of: 09/16/13  4:36 PM.  Always use your most recent med list.               acetaminophen 500 MG tablet  Commonly known as:  TYLENOL  Take 500 mg by mouth every 6 (six) hours as needed for moderate pain.     acyclovir 200 MG capsule  Commonly known as:  ZOVIRAX  Take 2 capsules (400 mg total) by mouth 2 (two) times daily.     ferrous sulfate 325 (65 FE) MG EC tablet  Take 325 mg by mouth daily.     GLIPIZIDE XL 2.5 MG 24 hr tablet  Generic drug:  glipiZIDE  TAKE 1 TABLET BY MOUTH DAILY WITH LUNCH.     GlucoCom Lancets Misc  As directed TID and QHS     glucose blood test strip  Commonly known as:  CHOICE DM FORA G20 TEST STRIPS  Use as instructed     metFORMIN 1000 MG tablet  Commonly known as:  GLUCOPHAGE  TAKE 1 TABLET BY MOUTH 2 TIMES DAILY WITH A MEAL.     SYNTHROID 125 MCG tablet  Generic drug:  levothyroxine  TAKE 1 TABLET BY MOUTH DAILY BEFORE BREAKFAST.        No orders of the defined types were placed in this encounter.    Immunization History  Administered Date(s) Administered  . Pneumococcal Polysaccharide-23 10/05/2011    Family History  Problem Relation Age of Onset  . Hypertension Mother   . Glaucoma Mother   . Hypertension Father   . Cancer Father     precancerious polyps  . Cancer Maternal Uncle     colon    History  Substance Use Topics  . Smoking status: Never Smoker   . Smokeless tobacco: Never Used  . Alcohol Use: No    Review of Systems   As noted in HPI  Filed Vitals:   09/16/13 1615  BP: 139/83  Pulse: 85  Temp: 98.5 F (36.9 C)  Resp: 16    Physical Exam  Physical Exam  Constitutional: No distress.  Eyes: EOM are normal. Pupils are equal, round, and reactive to light.  Cardiovascular: Normal rate and regular rhythm.   Pulmonary/Chest: Breath sounds normal. No respiratory distress. She has no wheezes. She has no rales.  Musculoskeletal: She exhibits no  edema.    CBC    Component Value Date/Time   WBC  8.4 06/09/2013 1240   RBC 4.64 06/09/2013 1240   HGB 11.9* 06/09/2013 1240   HCT 37.3 06/09/2013 1240   PLT 235 06/09/2013 1240   MCV 80.4 06/09/2013 1240   LYMPHSABS 1.8 06/09/2013 1240   MONOABS 0.5 06/09/2013 1240   EOSABS 0.3 06/09/2013 1240   BASOSABS 0.1 06/09/2013 1240    CMP     Component Value Date/Time   NA 133* 06/09/2013 1240   K 3.8 06/09/2013 1240   CL 101 06/09/2013 1240   CO2 23 06/09/2013 1240   GLUCOSE 219* 06/09/2013 1240   BUN 10 06/09/2013 1240   CREATININE 0.69 06/09/2013 1240   CREATININE 0.76 02/14/2013 0530   CALCIUM 8.5 06/09/2013 1240   PROT 6.8 06/09/2013 1240   ALBUMIN 3.9 06/09/2013 1240   AST 24 06/09/2013 1240   ALT 26 06/09/2013 1240   ALKPHOS 76 06/09/2013 1240   BILITOT 0.6 06/09/2013 1240   GFRNONAA >89 06/09/2013 1240   GFRNONAA >90 02/14/2013 0530   GFRAA >89 06/09/2013 1240   GFRAA >90 02/14/2013 0530    Lab Results  Component Value Date/Time   CHOL 117 06/09/2013 12:40 PM    No components found with this basename: hga1c    Lab Results  Component Value Date/Time   AST 24 06/09/2013 12:40 PM    Assessment and Plan  Diabetes mellitus due to underlying condition without complications - Plan:  Results for orders placed in visit on 09/16/13  GLUCOSE, POCT (MANUAL RESULT ENTRY)      Result Value Ref Range   POC Glucose 230 (*) 70 - 99 mg/dl  POCT GLYCOSYLATED HEMOGLOBIN (HGB A1C)      Result Value Ref Range   Hemoglobin A1C 7.7%     Patient is going to modify her diet, she will continue with metformin and her glipizide, continue with fingerstick glucose log and diabetes meal planning, will repeat her A1c in 3 months.  Unspecified hypothyroidism TSH level is in normal range continue with levothyroxine 168mcg daily.  Elevated triglycerides with high cholesterol Advised patient for low fat diet.  Elevated BP Advised patient for DASH diet.   Return in about 3 months (around 12/17/2013) for  diabetes, hypertriglycridemia, hypothyroid.  Lorayne Marek, MD

## 2013-09-16 NOTE — Progress Notes (Signed)
Patient here for follow up on her diabets States suffers with ADHD and would like some  Medication Prescribed because she just started going back to school

## 2013-09-18 ENCOUNTER — Other Ambulatory Visit: Payer: Self-pay

## 2013-09-18 DIAGNOSIS — E039 Hypothyroidism, unspecified: Secondary | ICD-10-CM

## 2013-09-18 MED ORDER — LEVOTHYROXINE SODIUM 125 MCG PO TABS: ORAL_TABLET | ORAL | Status: DC

## 2013-09-20 ENCOUNTER — Encounter: Payer: Self-pay | Admitting: Obstetrics & Gynecology

## 2013-10-04 ENCOUNTER — Inpatient Hospital Stay (HOSPITAL_COMMUNITY)
Admission: AD | Admit: 2013-10-04 | Discharge: 2013-10-05 | Disposition: A | Discharge status: Home / Self Care | Payer: Medicaid Other | Source: Ambulatory Visit | Attending: Obstetrics & Gynecology | Admitting: Obstetrics & Gynecology

## 2013-10-04 ENCOUNTER — Encounter (HOSPITAL_COMMUNITY): Payer: Self-pay

## 2013-10-04 ENCOUNTER — Inpatient Hospital Stay (HOSPITAL_COMMUNITY): Payer: Medicaid Other

## 2013-10-04 DIAGNOSIS — K219 Gastro-esophageal reflux disease without esophagitis: Secondary | ICD-10-CM | POA: Insufficient documentation

## 2013-10-04 DIAGNOSIS — O26851 Spotting complicating pregnancy, first trimester: Secondary | ICD-10-CM

## 2013-10-04 DIAGNOSIS — O26859 Spotting complicating pregnancy, unspecified trimester: Secondary | ICD-10-CM | POA: Diagnosis present

## 2013-10-04 LAB — URINE MICROSCOPIC-ADD ON

## 2013-10-04 LAB — CBC
HCT: 39.9 % (ref 36.0–46.0)
Hemoglobin: 13 g/dL (ref 12.0–15.0)
MCH: 29.1 pg (ref 26.0–34.0)
MCHC: 32.6 g/dL (ref 30.0–36.0)
MCV: 89.5 fL (ref 78.0–100.0)
PLATELETS: 182 10*3/uL (ref 150–400)
RBC: 4.46 MIL/uL (ref 3.87–5.11)
RDW: 15 % (ref 11.5–15.5)
WBC: 9.1 10*3/uL (ref 4.0–10.5)

## 2013-10-04 LAB — URINALYSIS, ROUTINE W REFLEX MICROSCOPIC
Bilirubin Urine: NEGATIVE
Glucose, UA: NEGATIVE mg/dL
Ketones, ur: NEGATIVE mg/dL
Leukocytes, UA: NEGATIVE
NITRITE: NEGATIVE
Protein, ur: NEGATIVE mg/dL
SPECIFIC GRAVITY, URINE: 1.025 (ref 1.005–1.030)
UROBILINOGEN UA: 1 mg/dL (ref 0.0–1.0)
pH: 5.5 (ref 5.0–8.0)

## 2013-10-04 LAB — POCT PREGNANCY, URINE: Preg Test, Ur: POSITIVE — AB

## 2013-10-04 LAB — WET PREP, GENITAL
CLUE CELLS WET PREP: NONE SEEN
TRICH WET PREP: NONE SEEN
Yeast Wet Prep HPF POC: NONE SEEN

## 2013-10-04 LAB — HCG, QUANTITATIVE, PREGNANCY: hCG, Beta Chain, Quant, S: 1679 m[IU]/mL — ABNORMAL HIGH (ref <5)

## 2013-10-04 NOTE — MAU Provider Note (Signed)
History     CSN: [id number]  Arrival date and time: 10/04/13 1943   First Provider Initiated Contact with Patient 10/04/13 2307      Chief Complaint  Patient presents with  . Possible Pregnancy  . Vaginal Bleeding   HPI  [name] is a 38 y.o. [id number] at unknown GA who presents today with vaginal bleeding. She states that she started bleeding around 1930, and had one episode and none since. She had a positive pregnancy test at the [facility] at 09/22/13.   Past Medical History  Diagnosis Date  . Diabetes mellitus   . Migraine   . Hypoactive thyroid   . Cancer     cervical  . Osteoporosis   . Hypertension   . High cholesterol   . Anginal pain 2011    "stress per Dr Pang"-occ at present from increased stress  . GERD (gastroesophageal reflux disease)   . Psoriasis     scattered throughout body- large amount- states increased with stress of surgery, followed by [name].   . Fatty liver     with gall bladder sludge by pt history/ no stones  . Mental disorder     bipolar  . Anxiety   . Ventral hernia 09/13/2011  . Depression   . Shortness of breath     with exertion/   ? adult onset asthma  . Bipolar disorder   . Family history of anesthesia complication     aggressiveness  . History of stress test 2013    non-specific angina, followed up with stress test, /w [facility], told results were WNL, no need for followup unless she was symptomatic  . Sleep apnea 01/2011     STOP BANG SCORE 6, Sleep study done at [facility]   . Stroke 2007    ? subacranoid bleed-  no defits  . Seizures     febrile seizures as a child  . Anemia     hx of anemia during pregnancy , blood transfusion during hospitalization, for renal calculi /w sepsis   . Arthritis     ankles- recent steroid injections in both ankles, & R wrist   . Dysrhythmia     hx MVP; single episode of PAF in the setting of sepsis 06/2012 (resolved with Cardizem)  . PONV (postoperative nausea and  vomiting)     also hard to get asleep per pt; difficult IV access  . Pneumonia     hx of   . Chronic kidney disease     stones in past, hosp. at [facility], 6/14, became septic, PICC line discharged from hosp., for antibiotic Rx    Past Surgical History  Procedure Laterality Date  . Cesarean section  1997& 2011  . Appendectomy    . Lipoma      total 3  . Neuroplasty / transposition median nerve at carpal tunnel bilateral    . Cervical cancer      LEAP procedure  . Kidney stone surgery    . Ventral hernia repair  10/03/2011    Procedure: HERNIA REPAIR VENTRAL ADULT;  Surgeon: [name]. Tsuei, MD;  Location: [facility];  Service: General;  Laterality: N/A;  open ventral hernia repair with mesh  . Laparoscopy  10/22/2011    Procedure: LAPAROSCOPY DIAGNOSTIC;  Surgeon: [name]. Georgette Dover, MD;  Location: [facility];  Service: General;  Laterality: N/A;  . Hernia repair    . Eye surgery  10/2012    blepheroplasty- bilateral   .  Dilation and curettage of uterus      several times   . Ventral hernia repair N/A 02/12/2013    Procedure: LAPAROSCOPIC VENTRAL HERNIA REPAIR ;  Surgeon: [name]. Georgette Dover, MD;  Location: [facility];  Service: General;  Laterality: N/A;  . Insertion of mesh N/A 02/12/2013    Procedure: INSERTION OF MESH;  Surgeon: [name]. Georgette Dover, MD;  Location: [facility];  Service: General;  Laterality: N/A;  . Laparoscopic lysis of adhesions N/A 02/12/2013    Procedure: LAPAROSCOPIC LYSIS OF ADHESIONS GREATER THAN TWO HOURS;  Surgeon: [name]. Georgette Dover, MD;  Location: [facility];  Service: General;  Laterality: N/A;    Family History  Problem Relation Age of Onset  . Hypertension Mother   . Glaucoma Mother   . Hypertension Father   . Cancer Father     precancerious polyps  . Cancer Maternal Uncle     colon    History  Substance Use Topics  . Smoking status: Never Smoker   . Smokeless tobacco: Never Used  . Alcohol Use: No    Allergies:  Allergies  Allergen Reactions  . Cephalexin  Anaphylaxis and Shortness Of Breath    But can tolerate Ceftin and Amoxicillin per patient  . Cephalexin Anaphylaxis    But can tolerate Ceftin and Amoxicillin per patient  . Latex Itching  . Lisinopril Cough and Other (See Comments)    Cough  . Simvastatin Other (See Comments)    Back pain  . Statins Other (See Comments) and Nausea And Vomiting    Back pain Back pain   . Augmentin [Amoxicillin-Pot Clavulanate] Rash    Other reaction(s): Swelling Rash, eye swelling Rash, eye swelling  . Tea Rash    tanins in tea     Prescriptions prior to admission  Medication Sig Dispense Refill  . acetaminophen (TYLENOL) 500 MG tablet Take 500 mg by mouth every 6 (six) hours as needed for moderate pain.       Marland Kitchen acyclovir (ZOVIRAX) 200 MG capsule Take 2 capsules (400 mg total) by mouth 2 (two) times daily.  30 capsule  0  . GLIPIZIDE XL 2.5 MG 24 hr tablet TAKE 1 TABLET BY MOUTH DAILY WITH LUNCH.  30 tablet  2  . GlucoCom Lancets MISC As directed TID and QHS  100 each  2  . glucose blood (CHOICE DM FORA G20 TEST STRIPS) test strip Use as instructed  100 each  12  . levothyroxine (SYNTHROID) 125 MCG tablet TAKE 1 TABLET BY MOUTH DAILY BEFORE BREAKFAST.  90 tablet  3  . metFORMIN (GLUCOPHAGE) 1000 MG tablet TAKE 1 TABLET BY MOUTH 2 TIMES DAILY WITH A MEAL.  60 tablet  2  . prenatal vitamin w/FE, FA (PRENATAL 1 + 1) 27-1 MG TABS tablet Take 1 tablet by mouth daily at 12 noon.      . ferrous sulfate 325 (65 FE) MG EC tablet Take 325 mg by mouth daily.         ROS Physical Exam   Blood pressure 163/87, pulse 87, temperature 98.5 F (36.9 C), temperature source Oral, resp. rate 18, height 5' 6.5" (1.689 m), weight 143.427 kg (316 lb 3.2 oz), last menstrual period 07/09/2013, SpO2 100.00%.  Physical Exam  Nursing note and vitals reviewed. Constitutional: She is oriented to person, place, and time. She appears well-developed and well-nourished. No distress.  Cardiovascular: Normal rate.    Respiratory: Effort normal.  GI: Soft. There is no tenderness.  Genitourinary:   External:  no lesion Vagina: small amount of white discharge Cervix: pink, smooth, no CMT Uterus: NSSC Adnexa: NT   Neurological: She is alert and oriented to person, place, and time.  Skin: Skin is warm and dry.  Psychiatric: She has a normal mood and affect.    MAU Course  Procedures  Results for orders placed during the hospital encounter of 10/04/13 (from the past 24 hour(s))  URINALYSIS, ROUTINE W REFLEX MICROSCOPIC     Status: Abnormal   Collection Time    10/04/13  7:59 PM      Result Value Ref Range   Color, Urine YELLOW  YELLOW   APPearance CLEAR  CLEAR   Specific Gravity, Urine 1.025  1.005 - 1.030   pH 5.5  5.0 - 8.0   Glucose, UA NEGATIVE  NEGATIVE mg/dL   Hgb urine dipstick LARGE (*) NEGATIVE   Bilirubin Urine NEGATIVE  NEGATIVE   Ketones, ur NEGATIVE  NEGATIVE mg/dL   Protein, ur NEGATIVE  NEGATIVE mg/dL   Urobilinogen, UA 1.0  0.0 - 1.0 mg/dL   Nitrite NEGATIVE  NEGATIVE   Leukocytes, UA NEGATIVE  NEGATIVE  URINE MICROSCOPIC-ADD ON     Status: Abnormal   Collection Time    10/04/13  7:59 PM      Result Value Ref Range   Squamous Epithelial / LPF RARE  RARE   WBC, UA 0-2  <3 WBC/hpf   RBC / HPF 3-6  <3 RBC/hpf   Bacteria, UA RARE  RARE   Crystals CA OXALATE CRYSTALS (*) NEGATIVE  POCT PREGNANCY, URINE     Status: Abnormal   Collection Time    10/04/13  8:11 PM      Result Value Ref Range   Preg Test, Ur POSITIVE (*) NEGATIVE  CBC     Status: None   Collection Time    10/04/13  8:15 PM      Result Value Ref Range   WBC 9.1  4.0 - 10.5 K/uL   RBC 4.46  3.87 - 5.11 MIL/uL   Hemoglobin 13.0  12.0 - 15.0 g/dL   HCT 39.9  36.0 - 46.0 %   MCV 89.5  78.0 - 100.0 fL   MCH 29.1  26.0 - 34.0 pg   MCHC 32.6  30.0 - 36.0 g/dL   RDW 15.0  11.5 - 15.5 %   Platelets 182  150 - 400 K/uL  ABO/RH     Status: None   Collection Time    10/04/13  8:15 PM      Result Value Ref  Range   ABO/RH(D) A POS    HCG, QUANTITATIVE, PREGNANCY     Status: Abnormal   Collection Time    10/04/13  8:15 PM      Result Value Ref Range   hCG, Beta Chain, Quant, S 1679 (*) <5 mIU/mL  WET PREP, GENITAL     Status: Abnormal   Collection Time    10/04/13 11:35 PM      Result Value Ref Range   Yeast Wet Prep HPF POC NONE SEEN  NONE SEEN   Trich, Wet Prep NONE SEEN  NONE SEEN   Clue Cells Wet Prep HPF POC NONE SEEN  NONE SEEN   WBC, Wet Prep HPF POC RARE (*) NONE SEEN     Assessment and Plan   1. Spotting affecting pregnancy in first trimester    First trimester precautions reviewed Return to MAU as needed [facility], message sent to [facility] for appointment.  Follow-up Information   Follow up with [facility] In 2 days. (for repeat blood work. )    Sport and exercise psychologist information:   [address] [id number] [place] [address] [phone]      Mathis Bud 10/04/2013, 11:08 PM

## 2013-10-04 NOTE — Discharge Instructions (Signed)
First Trimester of Pregnancy The first trimester of pregnancy is from week 1 until the end of week 12 (months 1 through 3). A week after a sperm fertilizes an egg, the egg will implant on the wall of the uterus. This embryo will begin to develop into a baby. Genes from you and your partner are forming the baby. The female genes determine whether the baby is a boy or a girl. At 6-8 weeks, the eyes and face are formed, and the heartbeat can be seen on ultrasound. At the end of 12 weeks, all the baby's organs are formed.  Now that you are pregnant, you will want to do everything you can to have a healthy baby. Two of the most important things are to get good prenatal care and to follow your health care provider's instructions. Prenatal care is all the medical care you receive before the baby's birth. This care will help prevent, find, and treat any problems during the pregnancy and childbirth. BODY CHANGES Your body goes through many changes during pregnancy. The changes vary from woman to woman.   You may gain or lose a couple of pounds at first.  You may feel sick to your stomach (nauseous) and throw up (vomit). If the vomiting is uncontrollable, call your health care provider.  You may tire easily.  You may develop headaches that can be relieved by medicines approved by your health care provider.  You may urinate more often. Painful urination may mean you have a bladder infection.  You may develop heartburn as a result of your pregnancy.  You may develop constipation because certain hormones are causing the muscles that push waste through your intestines to slow down.  You may develop hemorrhoids or swollen, bulging veins (varicose veins).  Your breasts may begin to grow larger and become tender. Your nipples may stick out more, and the tissue that surrounds them (areola) may become darker.  Your gums may bleed and may be sensitive to brushing and flossing.  Dark spots or blotches (chloasma,  mask of pregnancy) may develop on your face. This will likely fade after the baby is born.  Your menstrual periods will stop.  You may have a loss of appetite.  You may develop cravings for certain kinds of food.  You may have changes in your emotions from day to day, such as being excited to be pregnant or being concerned that something may go wrong with the pregnancy and baby.  You may have more vivid and strange dreams.  You may have changes in your hair. These can include thickening of your hair, rapid growth, and changes in texture. Some women also have hair loss during or after pregnancy, or hair that feels dry or thin. Your hair will most likely return to normal after your baby is born. WHAT TO EXPECT AT YOUR PRENATAL VISITS During a routine prenatal visit:  You will be weighed to make sure you and the baby are growing normally.  Your blood pressure will be taken.  Your abdomen will be measured to track your baby's growth.  The fetal heartbeat will be listened to starting around week 10 or 12 of your pregnancy.  Test results from any previous visits will be discussed. Your health care provider may ask you:  How you are feeling.  If you are feeling the baby move.  If you have had any abnormal symptoms, such as leaking fluid, bleeding, severe headaches, or abdominal cramping.  If you have any questions. Other tests   that may be performed during your first trimester include:  Blood tests to find your blood type and to check for the presence of any previous infections. They will also be used to check for low iron levels (anemia) and Rh antibodies. Later in the pregnancy, blood tests for diabetes will be done along with other tests if problems develop.  Urine tests to check for infections, diabetes, or protein in the urine.  An ultrasound to confirm the proper growth and development of the baby.  An amniocentesis to check for possible genetic problems.  Fetal screens for  spina bifida and Down syndrome.  You may need other tests to make sure you and the baby are doing well. HOME CARE INSTRUCTIONS  Medicines  Follow your health care provider's instructions regarding medicine use. Specific medicines may be either safe or unsafe to take during pregnancy.  Take your prenatal vitamins as directed.  If you develop constipation, try taking a stool softener if your health care provider approves. Diet  Eat regular, well-balanced meals. Choose a variety of foods, such as meat or vegetable-based protein, fish, milk and low-fat dairy products, vegetables, fruits, and whole grain breads and cereals. Your health care provider will help you determine the amount of weight gain that is right for you.  Avoid raw meat and uncooked cheese. These carry germs that can cause birth defects in the baby.  Eating four or five small meals rather than three large meals a day may help relieve nausea and vomiting. If you start to feel nauseous, eating a few soda crackers can be helpful. Drinking liquids between meals instead of during meals also seems to help nausea and vomiting.  If you develop constipation, eat more high-fiber foods, such as fresh vegetables or fruit and whole grains. Drink enough fluids to keep your urine clear or pale yellow. Activity and Exercise  Exercise only as directed by your health care provider. Exercising will help you:  Control your weight.  Stay in shape.  Be prepared for labor and delivery.  Experiencing pain or cramping in the lower abdomen or low back is a good sign that you should stop exercising. Check with your health care provider before continuing normal exercises.  Try to avoid standing for long periods of time. Move your legs often if you must stand in one place for a long time.  Avoid heavy lifting.  Wear low-heeled shoes, and practice good posture.  You may continue to have sex unless your health care provider directs you  otherwise. Relief of Pain or Discomfort  Wear a good support bra for breast tenderness.   Take warm sitz baths to soothe any pain or discomfort caused by hemorrhoids. Use hemorrhoid cream if your health care provider approves.   Rest with your legs elevated if you have leg cramps or low back pain.  If you develop varicose veins in your legs, wear support hose. Elevate your feet for 15 minutes, 3-4 times a day. Limit salt in your diet. Prenatal Care  Schedule your prenatal visits by the twelfth week of pregnancy. They are usually scheduled monthly at first, then more often in the last 2 months before delivery.  Write down your questions. Take them to your prenatal visits.  Keep all your prenatal visits as directed by your health care provider. Safety  Wear your seat belt at all times when driving.  Make a list of emergency phone numbers, including numbers for family, friends, the hospital, and police and fire departments. General Tips    Ask your health care provider for a referral to a local prenatal education class. Begin classes no later than at the beginning of month 6 of your pregnancy.  Ask for help if you have counseling or nutritional needs during pregnancy. Your health care provider can offer advice or refer you to specialists for help with various needs.  Do not use hot tubs, steam rooms, or saunas.  Do not douche or use tampons or scented sanitary pads.  Do not cross your legs for long periods of time.  Avoid cat litter boxes and soil used by cats. These carry germs that can cause birth defects in the baby and possibly loss of the fetus by miscarriage or stillbirth.  Avoid all smoking, herbs, alcohol, and medicines not prescribed by your health care provider. Chemicals in these affect the formation and growth of the baby.  Schedule a dentist appointment. At home, brush your teeth with a soft toothbrush and be gentle when you floss. SEEK MEDICAL CARE IF:   You have  dizziness.  You have mild pelvic cramps, pelvic pressure, or nagging pain in the abdominal area.  You have persistent nausea, vomiting, or diarrhea.  You have a bad smelling vaginal discharge.  You have pain with urination.  You notice increased swelling in your face, hands, legs, or ankles. SEEK IMMEDIATE MEDICAL CARE IF:   You have a fever.  You are leaking fluid from your vagina.  You have spotting or bleeding from your vagina.  You have severe abdominal cramping or pain.  You have rapid weight gain or loss.  You vomit blood or material that looks like coffee grounds.  You are exposed to German measles and have never had them.  You are exposed to fifth disease or chickenpox.  You develop a severe headache.  You have shortness of breath.  You have any kind of trauma, such as from a fall or a car accident. Document Released: 01/02/2001 Document Revised: 05/25/2013 Document Reviewed: 11/18/2012 ExitCare Patient Information 2015 [employer]. This information is not intended to replace advice given to you by your health care provider. Make sure you discuss any questions you have with your health care provider.  

## 2013-10-04 NOTE — MAU Note (Signed)
[facility]. EDD 3/24 by [facility] per health dept. Vaginal bleeding, started this evening, light pink spotting on toilet paper x 1. Poor history, so concerned b/c miscarriages.  Low backache since yesterday.

## 2013-10-05 LAB — ABO/RH: ABO/RH(D): A POS

## 2013-10-06 ENCOUNTER — Encounter (HOSPITAL_COMMUNITY): Payer: Self-pay | Admitting: Medical

## 2013-10-06 ENCOUNTER — Inpatient Hospital Stay (HOSPITAL_COMMUNITY)
Admission: AD | Admit: 2013-10-06 | Discharge: 2013-10-06 | Disposition: A | Discharge status: Home / Self Care | Payer: Medicaid Other | Source: Ambulatory Visit | Attending: Obstetrics & Gynecology | Admitting: Obstetrics & Gynecology

## 2013-10-06 DIAGNOSIS — O0281 Inappropriate change in quantitative human chorionic gonadotropin (hCG) in early pregnancy: Secondary | ICD-10-CM | POA: Insufficient documentation

## 2013-10-06 DIAGNOSIS — O26851 Spotting complicating pregnancy, first trimester: Secondary | ICD-10-CM

## 2013-10-06 DIAGNOSIS — O26859 Spotting complicating pregnancy, unspecified trimester: Secondary | ICD-10-CM

## 2013-10-06 LAB — HCG, QUANTITATIVE, PREGNANCY: hCG, Beta Chain, Quant, S: 2060 m[IU]/mL — ABNORMAL HIGH (ref <5)

## 2013-10-06 NOTE — Discharge Instructions (Signed)
Vaginal Bleeding During Pregnancy, First Trimester  A small amount of bleeding (spotting) from the vagina is relatively common in early pregnancy. It usually stops on its own. Various things may cause bleeding or spotting in early pregnancy. Some bleeding may be related to the pregnancy, and some may not. In most cases, the bleeding is normal and is not a problem. However, bleeding can also be a sign of something serious. Be sure to tell your health care provider about any vaginal bleeding right away.  Some possible causes of vaginal bleeding during the first trimester include:  · Infection or inflammation of the cervix.  · Growths (polyps) on the cervix.  · Miscarriage or threatened miscarriage.  · Pregnancy tissue has developed outside of the uterus and in a fallopian tube (tubal pregnancy).  · Tiny cysts have developed in the uterus instead of pregnancy tissue (molar pregnancy).  HOME CARE INSTRUCTIONS   Watch your condition for any changes. The following actions may help to lessen any discomfort you are feeling:  · Follow your health care provider's instructions for limiting your activity. If your health care provider orders bed rest, you may need to stay in bed and only get up to use the bathroom. However, your health care provider may allow you to continue light activity.  · If needed, make plans for someone to help with your regular activities and responsibilities while you are on bed rest.  · Keep track of the number of pads you use each day, how often you change pads, and how soaked (saturated) they are. Write this down.  · Do not use tampons. Do not douche.  · Do not have sexual intercourse or orgasms until approved by your health care provider.  · If you pass any tissue from your vagina, save the tissue so you can show it to your health care provider.  · Only take over-the-counter or prescription medicines as directed by your health care provider.  · Do not take aspirin because it can make you  bleed.  · Keep all follow-up appointments as directed by your health care provider.  SEEK MEDICAL CARE IF:  · You have any vaginal bleeding during any part of your pregnancy.  · You have cramps or labor pains.  · You have a fever, not controlled by medicine.  SEEK IMMEDIATE MEDICAL CARE IF:   · You have severe cramps in your back or belly (abdomen).  · You pass large clots or tissue from your vagina.  · Your bleeding increases.  · You feel light-headed or weak, or you have fainting episodes.  · You have chills.  · You are leaking fluid or have a gush of fluid from your vagina.  · You pass out while having a bowel movement.  MAKE SURE YOU:  · Understand these instructions.  · Will watch your condition.  · Will get help right away if you are not doing well or get worse.  Document Released: 10/18/2004 Document Revised: 01/13/2013 Document Reviewed: 09/15/2012  ExitCare® Patient Information ©2015 [employer]. This information is not intended to replace advice given to you by your health care provider. Make sure you discuss any questions you have with your health care provider.

## 2013-10-06 NOTE — MAU Provider Note (Signed)
[name] is a 38 y.o. [id number] at [redacted]w[redacted]d who presents to MAU today for follow-up quant hCG. The patient was seen in MAU on 10/04/13 with spotting. She states that spotting has continued. She denies abdominal pain or fever today.   BP 144/76  Pulse 86  Temp(Src) 98.1 F (36.7 C) (Oral)  Resp 18  Ht 5\' 5"  (1.651 m)  Wt 318 lb 6.4 oz (144.425 kg)  BMI 52.98 kg/m2  SpO2 99%  LMP 07/09/2013 GENERAL: Well-developed, well-nourished female in no acute distress.  HEENT: Normocephalic, atraumatic.   LUNGS: Effort normal HEART: Regular rate  SKIN: Warm, dry and without erythema PSYCH: Normal mood and affect  Results for [name], [name] (MRN [id number]) as of 10/06/2013 22:34  Ref. Range 10/04/2013 20:15 10/04/2013 22:09 10/04/2013 23:35 10/06/2013 21:30  hCG, Beta Chain, Quant, S Latest Range: <5 mIU/mL 1679 (H)   2060 (H)   MDM Discussed with Dr. Hulan Fray. Recommends cancel WOC appt and have patient follow-up in MAU Thursday evening. We can reschedule patient for [facility] once/if normal pregnancy is confirmed  A: Inappropriate rise in quant hCG after 48 hours  P: Discharge home Patient advised to follow-up in MAU in 48 hours for repeat labs or sooner PRN Message sent to [facility] to cancel appointment for [facility] on 10/08/13  Luvenia Redden, PA-C  10/06/2013 11:52 PM

## 2013-10-06 NOTE — MAU Note (Signed)
Pt here for follow up labs. No pain or changes in bleeding.

## 2013-10-08 ENCOUNTER — Encounter: Payer: No Typology Code available for payment source | Admitting: Obstetrics and Gynecology

## 2013-10-08 ENCOUNTER — Inpatient Hospital Stay (HOSPITAL_COMMUNITY)
Admission: AD | Admit: 2013-10-08 | Discharge: 2013-10-08 | Disposition: A | Discharge status: Home / Self Care | Payer: Medicaid Other | Source: Ambulatory Visit | Attending: Obstetrics & Gynecology | Admitting: Obstetrics & Gynecology

## 2013-10-08 DIAGNOSIS — O200 Threatened abortion: Secondary | ICD-10-CM

## 2013-10-08 DIAGNOSIS — O219 Vomiting of pregnancy, unspecified: Secondary | ICD-10-CM

## 2013-10-08 LAB — HCG, QUANTITATIVE, PREGNANCY: hCG, Beta Chain, Quant, S: 2586 m[IU]/mL — ABNORMAL HIGH (ref <5)

## 2013-10-08 MED ORDER — PROMETHAZINE HCL 25 MG PO TABS: 25.0000 mg | ORAL_TABLET | Freq: Four times a day (QID) | ORAL | Status: DC | PRN

## 2013-10-08 NOTE — MAU Provider Note (Signed)
Attestation of Attending Supervision of Advanced Practitioner (CNM/NP): Evaluation and management procedures were performed by the Advanced Practitioner under my supervision and collaboration.  I have reviewed the Advanced Practitioner's note and chart, and I agree with the management and plan.  HARRAWAY-SMITH, Rafeal Skibicki 11:00 PM

## 2013-10-08 NOTE — Discharge Instructions (Signed)
Your ultrasound and repeat blood work are scheduled for sept 22, 2015 Hyperemesis Gravidarum Hyperemesis gravidarum is a severe form of nausea and vomiting that happens during pregnancy. Hyperemesis is worse than morning sickness. It may cause you to have nausea or vomiting all day for many days. It may keep you from eating and drinking enough food and liquids. Hyperemesis usually occurs during the first half (the first 20 weeks) of pregnancy. It often goes away once a woman is in her second half of pregnancy. However, sometimes hyperemesis continues through an entire pregnancy.  CAUSES  The cause of this condition is not completely known but is thought to be related to changes in the body's hormones when pregnant. It could be from the high level of the pregnancy hormone or an increase in estrogen in the body.  SIGNS AND SYMPTOMS   Severe nausea and vomiting.  Nausea that does not go away.  Vomiting that does not allow you to keep any food down.  Weight loss and body fluid loss (dehydration).  Having no desire to eat or not liking food you have previously enjoyed. DIAGNOSIS  Your health care provider will do a physical exam and ask you about your symptoms. He or she may also order blood tests and urine tests to make sure something else is not causing the problem.  TREATMENT  You may only need medicine to control the problem. If medicines do not control the nausea and vomiting, you will be treated in the hospital to prevent dehydration, increased acid in the blood (acidosis), weight loss, and changes in the electrolytes in your body that may harm the unborn baby (fetus). You may need IV fluids.  HOME CARE INSTRUCTIONS   Only take over-the-counter or prescription medicines as directed by your health care provider.  Try eating a couple of dry crackers or toast in the morning before getting out of bed.  Avoid foods and smells that upset your stomach.  Avoid fatty and spicy foods.  Eat 5-6  small meals a day.  Do not drink when eating meals. Drink between meals.  For snacks, eat high-protein foods, such as cheese.  Eat or suck on things that have ginger in them. Ginger helps nausea.  Avoid food preparation. The smell of food can spoil your appetite.  Avoid iron pills and iron in your multivitamins until after 3-4 months of being pregnant. However, consult with your health care provider before stopping any prescribed iron pills. SEEK MEDICAL CARE IF:   Your abdominal pain increases.  You have a severe headache.  You have vision problems.  You are losing weight. SEEK IMMEDIATE MEDICAL CARE IF:   You are unable to keep fluids down.  You vomit blood.  You have constant nausea and vomiting.  You have excessive weakness.  You have extreme thirst.  You have dizziness or fainting.  You have a fever or persistent symptoms for more than 2-3 days.  You have a fever and your symptoms suddenly get worse. MAKE SURE YOU:   Understand these instructions.  Will watch your condition.  Will get help right away if you are not doing well or get worse. Document Released: 01/08/2005 Document Revised: 10/29/2012 Document Reviewed: 08/20/2012 [facility] Patient Information 2015 [place], [place]. This information is not intended to replace advice given to you by your health care provider. Make sure you discuss any questions you have with your health care provider.  Early Elective Birth Early elective birth refers to making a choice to have a  baby before the time the baby is due. The length of a pregnancy is 9 months, or 40 weeks, starting from the beginning of a woman's last menstrual period. Most women naturally go into labor around 40 weeks of gestation. A full-term pregnancy is considered between 37 weeks and 42 weeks of gestation. Currently, early elective births can take place sometime after 39 weeks of gestation. Most health care providers practice within the guidelines of  delivering a baby no later than 42 weeks of gestation and no earlier than 39 weeks of gestation. There are exceptions to this time interval, and the risks involved to the mother and baby need to be considered in those cases.  Induction of labor refers to the use of medicines to bring aboutcontractions. Labor is when the cervix starts to widen (dilate). Active labor is when there are contractions and the cervix has dilated to at least 4 cm. Oftentimes, the earlier a mother is in her pregnancy, the longer it takes to get induced. When the cervix is ready (dilated and soft), an induction may take less than a day. However, when a cervix is far away from being ready (long, closed, and firm), it may take days in a hospital for labor to start.  Currently, 39 weeks of gestation is considered the earliest a health care providershould start the induction process. This is because the longer the baby stays inside the uterus, the lower the risks are to both the baby and mother. However, sometimes there are very good reasons for a pregnancy to be induced before 39 weeks of gestation. These exceptions are specific to each individual pregnancy and need to be considered on a case-by-case basis. A good reason to induce one pregnancy may not be a good reason for another pregnancy.  REASONS FOR ELECTIVE BIRTH It may be safer to induce labor before 39 weeks of gestation if:   A woman is carrying more than 1 baby. Current standards are to deliver twin pregnancies at 54 weeks of gestation.  A woman is having complications, such as:  High blood pressure caused by pregnancy (preeclampsia).  Bleeding.  Infection.  There are conditions affecting the baby's health, such as:  Intrauterine growth restriction (IUGR), where the baby is not growing well.  Having abnormal fetal heart rate patterns on the monitor (nonreassuring tracing).  Having a lack of fluid that surrounds the baby (oligohydramnios).  Having placental  issues.  Fluid that surrounds the baby (amniotic fluid) is leaking. There are many other safety reasons that a pregnancy may need to be induced early. REASONS AGAINST ELECTIVE BIRTH Sometimes early elective birth is not the best choice. It may not be a good idea if:   An early birth is just more convenient.  You want the baby to be born on a certain date, like a holiday.  You are more likely to need a cesarean delivery before 39 weeks of gestation. A cesarean delivery can lead to other problems. Problems include infection, bleeding, and not having enough iron in your blood (anemia), which can cause weakness.  Babies born early (38-37 weeks of gestation):  May need special care at the hospital or in a special care nursery.  Are at a greater risk for:  Brain damage.  Feeding problems.  Breathing problems.  Slow physical and mental development.  May need special care in a neonatal intensive care unit (NICU), but this is rare. The length of the baby's stay in the hospital will depend on how quickly he or  she progresses to a safe level of care.  Are at a greater risk for:  Infection.  Bleeding inside the brain.  Dying during their first year of life. REDUCING EARLY ELECTIVE BIRTHS Carrying a baby longer than 42 weeks of gestation is not good for the baby or the mother. A full-term pregnancy is best for baby and mother. Anything earlier can be risky for you and your baby. Remember:  An early elective birth may lead to a cesarean delivery. This can lead to other problems for the mother and baby.  An early elective birth can result in developmental problems for your child.  A baby's brain continues to develop while in the uterus.  A baby's body continues to develop. The baby will be better able to breathe and eat when he or she is born near the due date.  A baby who stays in the uterus longer responds better. The baby will also bond better with you. Document Released:  09/20/2010 Document Revised: 05/25/2013 Document Reviewed: 08/07/2012 [facility] Patient Information 2015 [place], [place]. This information is not intended to replace advice given to you by your health care provider. Make sure you discuss any questions you have with your health care provider. Threatened Miscarriage A threatened miscarriage occurs when you have vaginal bleeding during your first 20 weeks of pregnancy but the pregnancy has not ended. If you have vaginal bleeding during this time, your health care provider will do tests to make sure you are still pregnant. If the tests show you are still pregnant and the developing baby (fetus) inside your womb (uterus) is still growing, your condition is considered a threatened miscarriage. A threatened miscarriage does not mean your pregnancy will end, but it does increase the risk of losing your pregnancy (complete miscarriage). CAUSES  The cause of a threatened miscarriage is usually not known. If you go on to have a complete miscarriage, the most common cause is an abnormal number of chromosomes in the developing baby. Chromosomes are the structures inside cells that hold all your genetic material. Some causes of vaginal bleeding that do not result in miscarriage include:  Having sex.  Having an infection.  Normal hormone changes of pregnancy.  Bleeding that occurs when an egg implants in your uterus. RISK FACTORS Risk factors for bleeding in early pregnancy include:  Obesity.  Smoking.  Drinking excessive amounts of alcohol or caffeine.  Recreational drug use. SIGNS AND SYMPTOMS  Light vaginal bleeding.  Mild abdominal pain or cramps. DIAGNOSIS  If you have bleeding with or without abdominal pain before 20 weeks of pregnancy, your health care provider will do tests to check whether you are still pregnant. One important test involves using sound waves and a computer (ultrasound) to create images of the inside of your uterus. Other  tests include an internal exam of your vagina and uterus (pelvic exam) and measurement of your baby's heart rate.  You may be diagnosed with a threatened miscarriage if:  Ultrasound testing shows you are still pregnant.  Your baby's heart rate is strong.  A pelvic exam shows that the opening between your uterus and your vagina (cervix) is closed.  Your heart rate and blood pressure are stable.  Blood tests confirm you are still pregnant. TREATMENT  No treatments have been shown to prevent a threatened miscarriage from going on to a complete miscarriage. However, the right home care is important.  HOME CARE INSTRUCTIONS   Make sure you keep all your appointments for prenatal care. This is  very important.  Get plenty of rest.  Do not have sex or use tampons if you have vaginal bleeding.  Do not douche.  Do not smoke or use recreational drugs.  Do not drink alcohol.  Avoid caffeine. SEEK MEDICAL CARE IF:  You have light vaginal bleeding or spotting while pregnant.  You have abdominal pain or cramping.  You have a fever. SEEK IMMEDIATE MEDICAL CARE IF:  You have heavy vaginal bleeding.  You have blood clots coming from your vagina.  You have severe low back pain or abdominal cramps.  You have fever, chills, and severe abdominal pain. MAKE SURE YOU:  Understand these instructions.  Will watch your condition.  Will get help right away if you are not doing well or get worse. Document Released: 01/08/2005 Document Revised: 01/13/2013 Document Reviewed: 11/04/2012 [facility] Patient Information 2015 [place], [place]. This information is not intended to replace advice given to you by your health care provider. Make sure you discuss any questions you have with your health care provider.

## 2013-10-08 NOTE — MAU Note (Signed)
PT SAYS  HAS VAG BLEEDING -  - SILVER DOLLAR SIZE  Q 3-4 HRS.  IF LIES DOWN - STOPS .   THIS IS LESS THAN WAS ON Tuesday-   BUT IS BRIGHTER RED.   Marland Kitchen   PAIN-   IS 1-2 .

## 2013-10-08 NOTE — MAU Provider Note (Signed)
History     CSN: [id number]  Arrival date and time: 10/08/13 2118   None     No chief complaint on file.  HPI Comments: [name] 38 y.o. [id number] [redacted]w[redacted]d presents to MAU triage for follow up on her BHCG. She has has inappropriate rise. This is a wanted pregnancy. She was planning her care at [facility]. She has small amount bleeding and pain 1/10. She is requesting something for nausea.       Past Medical History  Diagnosis Date  . Diabetes mellitus   . Migraine   . Hypoactive thyroid   . Cancer     cervical  . Osteoporosis   . Hypertension   . High cholesterol   . Anginal pain 2011    "stress per Dr Pang"-occ at present from increased stress  . GERD (gastroesophageal reflux disease)   . Psoriasis     scattered throughout body- large amount- states increased with stress of surgery, followed by [name].   . Fatty liver     with gall bladder sludge by pt history/ no stones  . Mental disorder     bipolar  . Anxiety   . Ventral hernia 09/13/2011  . Depression   . Shortness of breath     with exertion/   ? adult onset asthma  . Bipolar disorder   . Family history of anesthesia complication     aggressiveness  . History of stress test 2013    non-specific angina, followed up with stress test, /w [facility], told results were WNL, no need for followup unless she was symptomatic  . Sleep apnea 01/2011     STOP BANG SCORE 6, Sleep study done at [facility]   . Stroke 2007    ? subacranoid bleed-  no defits  . Seizures     febrile seizures as a child  . Anemia     hx of anemia during pregnancy , blood transfusion during hospitalization, for renal calculi /w sepsis   . Arthritis     ankles- recent steroid injections in both ankles, & R wrist   . Dysrhythmia     hx MVP; single episode of PAF in the setting of sepsis 06/2012 (resolved with Cardizem)  . PONV (postoperative nausea and vomiting)     also hard to get asleep per pt; difficult  IV access  . Pneumonia     hx of   . Chronic kidney disease     stones in past, hosp. at [facility], 6/14, became septic, PICC line discharged from hosp., for antibiotic Rx    Past Surgical History  Procedure Laterality Date  . Cesarean section  1997& 2011  . Appendectomy    . Lipoma      total 3  . Neuroplasty / transposition median nerve at carpal tunnel bilateral    . Cervical cancer      LEAP procedure  . Kidney stone surgery    . Ventral hernia repair  10/03/2011    Procedure: HERNIA REPAIR VENTRAL ADULT;  Surgeon: [name]. Tsuei, MD;  Location: [facility];  Service: General;  Laterality: N/A;  open ventral hernia repair with mesh  . Laparoscopy  10/22/2011    Procedure: LAPAROSCOPY DIAGNOSTIC;  Surgeon: [name]. Georgette Dover, MD;  Location: [facility];  Service: General;  Laterality: N/A;  . Hernia repair    . Eye surgery  10/2012    blepheroplasty- bilateral   . Dilation and curettage of uterus  several times   . Ventral hernia repair N/A 02/12/2013    Procedure: LAPAROSCOPIC VENTRAL HERNIA REPAIR ;  Surgeon: [name]. Georgette Dover, MD;  Location: [facility];  Service: General;  Laterality: N/A;  . Insertion of mesh N/A 02/12/2013    Procedure: INSERTION OF MESH;  Surgeon: [name]. Georgette Dover, MD;  Location: [facility];  Service: General;  Laterality: N/A;  . Laparoscopic lysis of adhesions N/A 02/12/2013    Procedure: LAPAROSCOPIC LYSIS OF ADHESIONS GREATER THAN TWO HOURS;  Surgeon: [name]. Georgette Dover, MD;  Location: [facility];  Service: General;  Laterality: N/A;    Family History  Problem Relation Age of Onset  . Hypertension Mother   . Glaucoma Mother   . Hypertension Father   . Cancer Father     precancerious polyps  . Cancer Maternal Uncle     colon    History  Substance Use Topics  . Smoking status: Never Smoker   . Smokeless tobacco: Never Used  . Alcohol Use: No    Allergies:  Allergies  Allergen Reactions  . Cephalexin Anaphylaxis and Shortness Of Breath    But can tolerate Ceftin and  Amoxicillin per patient  . Cephalexin Anaphylaxis    But can tolerate Ceftin and Amoxicillin per patient  . Latex Itching  . Lisinopril Cough and Other (See Comments)    Cough  . Simvastatin Other (See Comments)    Back pain  . Statins Other (See Comments) and Nausea And Vomiting    Back pain Back pain   . Augmentin [Amoxicillin-Pot Clavulanate] Rash    Other reaction(s): Swelling Rash, eye swelling Rash, eye swelling  . Tea Rash    tanins in tea     Prescriptions prior to admission  Medication Sig Dispense Refill  . acetaminophen (TYLENOL) 500 MG tablet Take 500 mg by mouth every 6 (six) hours as needed for moderate pain.       Marland Kitchen acyclovir (ZOVIRAX) 200 MG capsule Take 2 capsules (400 mg total) by mouth 2 (two) times daily.  30 capsule  0  . ferrous sulfate 325 (65 FE) MG EC tablet Take 325 mg by mouth daily.       Marland Kitchen GLIPIZIDE XL 2.5 MG 24 hr tablet TAKE 1 TABLET BY MOUTH DAILY WITH LUNCH.  30 tablet  2  . GlucoCom Lancets MISC As directed TID and QHS  100 each  2  . glucose blood (CHOICE DM FORA G20 TEST STRIPS) test strip Use as instructed  100 each  12  . levothyroxine (SYNTHROID) 125 MCG tablet TAKE 1 TABLET BY MOUTH DAILY BEFORE BREAKFAST.  90 tablet  3  . metFORMIN (GLUCOPHAGE) 1000 MG tablet TAKE 1 TABLET BY MOUTH 2 TIMES DAILY WITH A MEAL.  60 tablet  2  . prenatal vitamin w/FE, FA (PRENATAL 1 + 1) 27-1 MG TABS tablet Take 1 tablet by mouth daily at 12 noon.        Review of Systems  Constitutional: Negative.   HENT: Negative.   Respiratory: Negative.   Cardiovascular: Negative.   Gastrointestinal: Positive for nausea and abdominal pain.       Very mild  Genitourinary: Negative.   Musculoskeletal: Negative.   Skin: Negative.   Neurological: Negative.   Psychiatric/Behavioral: Negative.    Physical Exam   Blood pressure 148/67, pulse 89, temperature 98.6 F (37 C), temperature source Oral, resp. rate 20, last menstrual period 07/09/2013.  Physical Exam   Constitutional: She is oriented to person, place, and time. She  appears well-developed and well-nourished. No distress.  HENT:  Head: Normocephalic and atraumatic.  Eyes: Conjunctivae are normal. Pupils are equal, round, and reactive to light.  Musculoskeletal: Normal range of motion.  Neurological: She is alert and oriented to person, place, and time.  Skin: Skin is warm.  Psychiatric: She has a normal mood and affect. Her behavior is normal. Thought content normal.   Results for orders placed during the hospital encounter of 10/08/13 (from the past 24 hour(s))  HCG, QUANTITATIVE, PREGNANCY     Status: Abnormal   Collection Time    10/08/13  9:21 PM      Result Value Ref Range   hCG, Beta Chain, Quant, S 2586 (*) <5 mIU/mL   Lab Results  Component Value Date   HCGBETAQNT 2586* 10/08/2013   HCGBETAQNT 2060* 10/06/2013   HCGBETAQNT 1679* 10/04/2013     MAU Course  Procedures  MDM   Assessment and Plan   A: Threatened Miscarriage  P: Will Schedule repeat U/S for Sept 22 with repeat BHCG on same day She will follow up with the MAU provider and a plan will be made She will return to MAU for any bleeding/ pain Pelvic rest  Georgia Duff 10/08/2013, 10:49 PM

## 2013-10-13 ENCOUNTER — Inpatient Hospital Stay (HOSPITAL_COMMUNITY)
Admission: AD | Admit: 2013-10-13 | Discharge: 2013-10-13 | Disposition: A | Discharge status: Home / Self Care | Payer: Medicaid Other | Source: Ambulatory Visit | Attending: Obstetrics & Gynecology | Admitting: Obstetrics & Gynecology

## 2013-10-13 ENCOUNTER — Other Ambulatory Visit (HOSPITAL_COMMUNITY): Payer: Self-pay | Admitting: Nurse Practitioner

## 2013-10-13 ENCOUNTER — Ambulatory Visit (HOSPITAL_COMMUNITY)
Admission: RE | Admit: 2013-10-13 | Discharge: 2013-10-13 | Disposition: A | Discharge status: Home / Self Care | Payer: Medicaid Other | Source: Ambulatory Visit | Attending: Nurse Practitioner | Admitting: Nurse Practitioner

## 2013-10-13 DIAGNOSIS — O209 Hemorrhage in early pregnancy, unspecified: Secondary | ICD-10-CM | POA: Insufficient documentation

## 2013-10-13 DIAGNOSIS — O200 Threatened abortion: Secondary | ICD-10-CM | POA: Insufficient documentation

## 2013-10-13 DIAGNOSIS — O24919 Unspecified diabetes mellitus in pregnancy, unspecified trimester: Secondary | ICD-10-CM | POA: Diagnosis not present

## 2013-10-13 DIAGNOSIS — E119 Type 2 diabetes mellitus without complications: Secondary | ICD-10-CM | POA: Insufficient documentation

## 2013-10-13 LAB — HCG, QUANTITATIVE, PREGNANCY: HCG, BETA CHAIN, QUANT, S: 3778 m[IU]/mL — AB (ref <5)

## 2013-10-13 NOTE — MAU Provider Note (Signed)
History     CSN: [id number]  Arrival date and time: 10/13/13 1022   None     Chief Complaint  Patient presents with  . Follow-up   HPI [name] 38 y.o. [id number] @ [redacted]w[redacted]d presents to MAU for follow up hcg and ultrasound.  No new complaints.  Still having vaginal bleeding OB History   Grav Para Term Preterm Abortions TAB SAB Ect Mult Living   8 3 3  0 4 0 4 0 0 3      Past Medical History  Diagnosis Date  . Diabetes mellitus   . Migraine   . Hypoactive thyroid   . Cancer     cervical  . Osteoporosis   . Hypertension   . High cholesterol   . Anginal pain 2011    "stress per Dr Pang"-occ at present from increased stress  . GERD (gastroesophageal reflux disease)   . Psoriasis     scattered throughout body- large amount- states increased with stress of surgery, followed by [name].   . Fatty liver     with gall bladder sludge by pt history/ no stones  . Mental disorder     bipolar  . Anxiety   . Ventral hernia 09/13/2011  . Depression   . Shortness of breath     with exertion/   ? adult onset asthma  . Bipolar disorder   . Family history of anesthesia complication     aggressiveness  . History of stress test 2013    non-specific angina, followed up with stress test, /w [facility], told results were WNL, no need for followup unless she was symptomatic  . Sleep apnea 01/2011     STOP BANG SCORE 6, Sleep study done at [facility] [facility]   . Stroke 2007    ? subacranoid bleed-  no defits  . Seizures     febrile seizures as a child  . Anemia     hx of anemia during pregnancy , blood transfusion during hospitalization, for renal calculi /w sepsis   . Arthritis     ankles- recent steroid injections in both ankles, & R wrist   . Dysrhythmia     hx MVP; single episode of PAF in the setting of sepsis 06/2012 (resolved with Cardizem)  . PONV (postoperative nausea and vomiting)     also hard to get asleep per pt; difficult IV access  . Pneumonia      hx of   . Chronic kidney disease     stones in past, hosp. at [place], 6/14, became septic, PICC line discharged from hosp., for antibiotic Rx    Past Surgical History  Procedure Laterality Date  . Cesarean section  1997& 2011  . Appendectomy    . Lipoma      total 3  . Neuroplasty / transposition median nerve at carpal tunnel bilateral    . Cervical cancer      LEAP procedure  . Kidney stone surgery    . Ventral hernia repair  10/03/2011    Procedure: HERNIA REPAIR VENTRAL ADULT;  Surgeon: [name]. Tsuei, MD;  Location: [facility];  Service: General;  Laterality: N/A;  open ventral hernia repair with mesh  . Laparoscopy  10/22/2011    Procedure: LAPAROSCOPY DIAGNOSTIC;  Surgeon: [name]. Georgette Dover, MD;  Location: [facility];  Service: General;  Laterality: N/A;  . Hernia repair    . Eye surgery  10/2012    blepheroplasty- bilateral   .  Dilation and curettage of uterus      several times   . Ventral hernia repair N/A 02/12/2013    Procedure: LAPAROSCOPIC VENTRAL HERNIA REPAIR ;  Surgeon: [name]. Georgette Dover, MD;  Location: [facility];  Service: General;  Laterality: N/A;  . Insertion of mesh N/A 02/12/2013    Procedure: INSERTION OF MESH;  Surgeon: [name]. Georgette Dover, MD;  Location: [facility];  Service: General;  Laterality: N/A;  . Laparoscopic lysis of adhesions N/A 02/12/2013    Procedure: LAPAROSCOPIC LYSIS OF ADHESIONS GREATER THAN TWO HOURS;  Surgeon: [name]. Georgette Dover, MD;  Location: [facility];  Service: General;  Laterality: N/A;    Family History  Problem Relation Age of Onset  . Hypertension Mother   . Glaucoma Mother   . Hypertension Father   . Cancer Father     precancerious polyps  . Cancer Maternal Uncle     colon    History  Substance Use Topics  . Smoking status: Never Smoker   . Smokeless tobacco: Never Used  . Alcohol Use: No    Allergies:  Allergies  Allergen Reactions  . Cephalexin Anaphylaxis and Shortness Of Breath    But can tolerate Ceftin and Amoxicillin per patient  .  Cephalexin Anaphylaxis    But can tolerate Ceftin and Amoxicillin per patient  . Latex Itching  . Lisinopril Cough and Other (See Comments)    Cough  . Simvastatin Other (See Comments)    Back pain  . Statins Other (See Comments) and Nausea And Vomiting    Back pain Back pain   . Augmentin [Amoxicillin-Pot Clavulanate] Rash    Other reaction(s): Swelling Rash, eye swelling Rash, eye swelling  . Tea Rash    tanins in tea     Prescriptions prior to admission  Medication Sig Dispense Refill  . acetaminophen (TYLENOL) 500 MG tablet Take 500 mg by mouth every 6 (six) hours as needed for moderate pain.       Marland Kitchen acyclovir (ZOVIRAX) 200 MG capsule Take 2 capsules (400 mg total) by mouth 2 (two) times daily.  30 capsule  0  . ferrous sulfate 325 (65 FE) MG EC tablet Take 325 mg by mouth daily.       Marland Kitchen GLIPIZIDE XL 2.5 MG 24 hr tablet TAKE 1 TABLET BY MOUTH DAILY WITH LUNCH.  30 tablet  2  . GlucoCom Lancets MISC As directed TID and QHS  100 each  2  . glucose blood (CHOICE DM FORA G20 TEST STRIPS) test strip Use as instructed  100 each  12  . levothyroxine (SYNTHROID) 125 MCG tablet TAKE 1 TABLET BY MOUTH DAILY BEFORE BREAKFAST.  90 tablet  3  . metFORMIN (GLUCOPHAGE) 1000 MG tablet TAKE 1 TABLET BY MOUTH 2 TIMES DAILY WITH A MEAL.  60 tablet  2  . prenatal vitamin w/FE, FA (PRENATAL 1 + 1) 27-1 MG TABS tablet Take 1 tablet by mouth daily at 12 noon.      . promethazine (PHENERGAN) 25 MG tablet Take 1 tablet (25 mg total) by mouth every 6 (six) hours as needed for nausea or vomiting.  30 tablet  0    ROS Physical Exam   Blood pressure 138/74, pulse 79, temperature 98.6 F (37 C), temperature source Oral, resp. rate 20, last menstrual period 07/09/2013, SpO2 96.00%.  Physical Exam  MAU Course  Procedures  MDM Ultrasound shows IUP with cardiac activity  Assessment and Plan  A: IUP  P: Discharge to home [facility]  asap - [facility] sent to [facility] due to diabetes Return to MAU for emergency.     Paticia Stack 10/13/2013, 1:19 PM

## 2013-10-13 NOTE — MAU Note (Signed)
Patient to MAU after ultrasound. Denies pain but still has a small amount of vaginal bleeding, bright red.

## 2013-10-19 ENCOUNTER — Encounter: Payer: No Typology Code available for payment source | Admitting: Obstetrics & Gynecology

## 2013-10-19 ENCOUNTER — Encounter (HOSPITAL_COMMUNITY): Payer: Self-pay | Admitting: *Deleted

## 2013-10-19 ENCOUNTER — Inpatient Hospital Stay (HOSPITAL_COMMUNITY)
Admission: AD | Admit: 2013-10-19 | Discharge: 2013-10-20 | Disposition: A | Discharge status: Home / Self Care | Payer: Medicaid Other | Source: Ambulatory Visit | Attending: Family Medicine | Admitting: Family Medicine

## 2013-10-19 ENCOUNTER — Inpatient Hospital Stay (HOSPITAL_COMMUNITY): Payer: Medicaid Other

## 2013-10-19 DIAGNOSIS — O418X10 Other specified disorders of amniotic fluid and membranes, first trimester, not applicable or unspecified: Secondary | ICD-10-CM

## 2013-10-19 DIAGNOSIS — O468X1 Other antepartum hemorrhage, first trimester: Secondary | ICD-10-CM

## 2013-10-19 DIAGNOSIS — O208 Other hemorrhage in early pregnancy: Secondary | ICD-10-CM | POA: Insufficient documentation

## 2013-10-19 DIAGNOSIS — O4590 Premature separation of placenta, unspecified, unspecified trimester: Secondary | ICD-10-CM

## 2013-10-19 DIAGNOSIS — O209 Hemorrhage in early pregnancy, unspecified: Secondary | ICD-10-CM | POA: Diagnosis present

## 2013-10-19 NOTE — MAU Note (Signed)
Pt reports she has a known subchorionic hemorrhage and her bleeding had stopped until today and she started passing clots. Lower back pain since this am.

## 2013-10-19 NOTE — MAU Note (Signed)
PT  HAS BEEN HERE FOR VAG BLEEDING-  LAST Tuesday - U/S  HAD [facility].    SAYS BLEEDING  STOPPED  ON WEEKEND.    BUT TODAY STARTED PASSING CLOTS  AT 11AM AND 9PM.    ON ARRIVAL-  HAD ON PAD   - SMALL AMT  DARK RED.   CRAMPS  NOT BAD -   FEELS PRESSURE    .   HAS AN APPOINTMENT WITH [facility]-   ON  MON

## 2013-10-19 NOTE — MAU Provider Note (Signed)
Chief Complaint: Vaginal Bleeding   None    SUBJECTIVE HPI: [name] is a 38 y.o. [id number] at [redacted]w[redacted]d by LMP who presents to maternity admissions reporting vaginal bleeding with clots slightly larger than a quarter in size. She reports she is soaking a pad every 3 hours today with dark red blood.  She has a known [place] diagnosed 10/13/13 in MAU but bleeding stopped, then resumed today, heavier than before.  She denies recent intercourse, vaginal itching/burning, urinary symptoms, h/a, dizziness, n/v, or fever/chills.     Past Medical History  Diagnosis Date  . Diabetes mellitus   . Migraine   . Hypoactive thyroid   . Cancer     cervical  . Osteoporosis   . Hypertension   . High cholesterol   . Anginal pain 2011    "stress per Dr Pang"-occ at present from increased stress  . GERD (gastroesophageal reflux disease)   . Psoriasis     scattered throughout body- large amount- states increased with stress of surgery, followed by [name].   . Fatty liver     with gall bladder sludge by pt history/ no stones  . Mental disorder     bipolar  . Anxiety   . Ventral hernia 09/13/2011  . Depression   . Shortness of breath     with exertion/   ? adult onset asthma  . Bipolar disorder   . Family history of anesthesia complication     aggressiveness  . History of stress test 2013    non-specific angina, followed up with stress test, /w [facility], told results were WNL, no need for followup unless she was symptomatic  . Sleep apnea 01/2011     STOP BANG SCORE 6, Sleep study done at [facility] [facility]   . Stroke 2007    ? subacranoid bleed-  no defits  . Seizures     febrile seizures as a child  . Anemia     hx of anemia during pregnancy , blood transfusion during hospitalization, for renal calculi /w sepsis   . Arthritis     ankles- recent steroid injections in both ankles, & R wrist   . Dysrhythmia     hx MVP; single episode of PAF in the setting of sepsis 06/2012  (resolved with Cardizem)  . PONV (postoperative nausea and vomiting)     also hard to get asleep per pt; difficult IV access  . Pneumonia     hx of   . Chronic kidney disease     stones in past, hosp. at [place], 6/14, became septic, PICC line discharged from hosp., for antibiotic Rx   Past Surgical History  Procedure Laterality Date  . Cesarean section  1997& 2011  . Appendectomy    . Lipoma      total 3  . Neuroplasty / transposition median nerve at carpal tunnel bilateral    . Cervical cancer      LEAP procedure  . Kidney stone surgery    . Ventral hernia repair  10/03/2011    Procedure: HERNIA REPAIR VENTRAL ADULT;  Surgeon: [name]. Tsuei, MD;  [facility]: [facility];  Service: General;  Laterality: N/A;  open ventral hernia repair with mesh  . Laparoscopy  10/22/2011    Procedure: LAPAROSCOPY DIAGNOSTIC;  Surgeon: [name]. Georgette Dover, MD;  Location: [facility];  Service: General;  Laterality: N/A;  . Hernia repair    . Eye surgery  10/2012    blepheroplasty- bilateral   .  Dilation and curettage of uterus      several times   . Ventral hernia repair N/A 02/12/2013    Procedure: LAPAROSCOPIC VENTRAL HERNIA REPAIR ;  Surgeon: [name]. Georgette Dover, MD;  Location: [facility];  Service: General;  Laterality: N/A;  . Insertion of mesh N/A 02/12/2013    Procedure: INSERTION OF MESH;  Surgeon: [name]. Georgette Dover, MD;  Location: [facility];  Service: General;  Laterality: N/A;  . Laparoscopic lysis of adhesions N/A 02/12/2013    Procedure: LAPAROSCOPIC LYSIS OF ADHESIONS GREATER THAN TWO HOURS;  Surgeon: [name]. Georgette Dover, MD;  Location: [facility];  Service: General;  Laterality: N/A;   History   Social History  . Marital Status: Married    Spouse Name: N/A    Number of Children: N/A  . Years of Education: N/A   Occupational History  . Not on file.   Social History Main Topics  . Smoking status: Never Smoker   . Smokeless tobacco: Never Used  . Alcohol Use: No  . Drug Use: No  . Sexual Activity: Yes    Other Topics Concern  . Not on file   Social History Narrative  . No narrative on file   Current Facility-Administered Medications on File Prior to Encounter  Medication Dose Route Frequency Provider Last Rate Last Dose  . midazolam (VERSED) 5 MG/5ML injection    PRN Manus Rudd [place] CRNA   2 mg at 08/06/11 8502   Current Outpatient Prescriptions on File Prior to Encounter  Medication Sig Dispense Refill  . acetaminophen (TYLENOL) 500 MG tablet Take 500 mg by mouth every 6 (six) hours as needed for moderate pain.       Marland Kitchen GLIPIZIDE XL 2.5 MG 24 hr tablet TAKE 1 TABLET BY MOUTH DAILY WITH LUNCH.  30 tablet  2  . levothyroxine (SYNTHROID) 125 MCG tablet TAKE 1 TABLET BY MOUTH DAILY BEFORE BREAKFAST.  90 tablet  3  . metFORMIN (GLUCOPHAGE) 1000 MG tablet TAKE 1 TABLET BY MOUTH 2 TIMES DAILY WITH A MEAL.  60 tablet  2  . prenatal vitamin w/FE, FA (PRENATAL 1 + 1) 27-1 MG TABS tablet Take 1 tablet by mouth daily at 12 noon.      . promethazine (PHENERGAN) 25 MG tablet Take 1 tablet (25 mg total) by mouth every 6 (six) hours as needed for nausea or vomiting.  30 tablet  0  . acyclovir (ZOVIRAX) 200 MG capsule Take 2 capsules (400 mg total) by mouth 2 (two) times daily.  30 capsule  0  . ferrous sulfate 325 (65 FE) MG EC tablet Take 325 mg by mouth daily.       . GlucoCom Lancets MISC As directed TID and QHS  100 each  2  . glucose blood (CHOICE DM FORA G20 TEST STRIPS) test strip Use as instructed  100 each  12   Allergies  Allergen Reactions  . Cephalexin Anaphylaxis and Shortness Of Breath    But can tolerate Ceftin and Amoxicillin per patient  . Cephalexin Anaphylaxis    But can tolerate Ceftin and Amoxicillin per patient  . Latex Itching  . Lisinopril Cough and Other (See Comments)    Cough  . Simvastatin Other (See Comments)    Back pain  . Statins Other (See Comments) and Nausea And Vomiting    Back pain Back pain   . Augmentin [Amoxicillin-Pot Clavulanate] Rash    Other  reaction(s): Swelling Rash, eye swelling Rash, eye swelling  . Tea Rash  tanins in tea     ROS: Pertinent items in HPI  OBJECTIVE Blood pressure 130/82, pulse 82, temperature 98.8 F (37.1 C), temperature source Oral, resp. rate 18, height 5\' 6"  (1.676 m), weight 142.883 kg (315 lb), last menstrual period 07/09/2013, SpO2 97.00%. GENERAL: Well-developed, well-nourished female in no acute distress.  HEENT: Normocephalic HEART: normal rate RESP: normal effort ABDOMEN: Soft, non-tender EXTREMITIES: Nontender, no edema NEURO: Alert and oriented SPECULUM EXAM: Deferred--done on 10/13/13 with cultures  LAB RESULTS Results for orders placed during the hospital encounter of 10/19/13 (from the past 24 hour(s))  CBC     Status: None   Collection Time    10/19/13 11:46 PM      Result Value Ref Range   WBC 9.9  4.0 - 10.5 K/uL   RBC 4.41  3.87 - 5.11 MIL/uL   Hemoglobin 13.1  12.0 - 15.0 g/dL   HCT 39.2  36.0 - 46.0 %   MCV 88.9  78.0 - 100.0 fL   MCH 29.7  26.0 - 34.0 pg   MCHC 33.4  30.0 - 36.0 g/dL   RDW 14.6  11.5 - 15.5 %   Platelets 222  150 - 400 K/uL    IMAGING   Preliminary U/S on 9/29 with IUP with cardiac activity, small [facility] again noted  US Ob Comp Less 14 Wks  10/13/2013   CLINICAL DATA:  Threatened miscarriage.  EXAM: OBSTETRIC <14 WK [place] AND TRANSVAGINAL OB US  TECHNIQUE: Both transabdominal and transvaginal ultrasound examinations were performed for complete evaluation of the gestation as well as the maternal uterus, adnexal regions, and pelvic cul-de-sac. Transvaginal technique was performed to assess early pregnancy.  COMPARISON:  None.  FINDINGS: Intrauterine gestational sac: Visualized/normal in shape.  Yolk sac:  Present.  Embryo:  Present.  Cardiac Activity: Present.  Heart Rate:  171 bpm  CRL:   0.34 cm 6 w 0 d                  [place] EDC: 06/07/2013  Maternal uterus/adnexae: Small subchorionic hemorrhage is present. Tiny complex cyst left ovary measure 1.5 cm most  likely corpus luteal cyst.  IMPRESSION: 1. Single viable intrauterine pregnancy at 6 weeks 0 days. 2. Small subchorionic hemorrhage.   Electronically Signed   By: Marcello Moores  Register   On: 10/13/2013 10:28      ASSESSMENT 1. Subchorionic hemorrhage in first trimester     PLAN Discharge home with bleeding precautions Continue PNV daily  Follow-up Information   Please follow up. (With early prenatal care as planned)       Follow up with [facility]. (As needed for emergencies)    Contact information:   [address] [id number] [place] [address] [phone]      Fatima Blank Certified Nurse-Midwife 10/20/2013  12:42 AM

## 2013-10-19 NOTE — Discharge Instructions (Signed)
Vaginal Bleeding During Pregnancy, First Trimester  A small amount of bleeding (spotting) from the vagina is relatively common in early pregnancy. It usually stops on its own. Various things may cause bleeding or spotting in early pregnancy. Some bleeding may be related to the pregnancy, and some may not. In most cases, the bleeding is normal and is not a problem. However, bleeding can also be a sign of something serious. Be sure to tell your health care provider about any vaginal bleeding right away.  Some possible causes of vaginal bleeding during the first trimester include:  · Infection or inflammation of the cervix.  · Growths (polyps) on the cervix.  · Miscarriage or threatened miscarriage.  · Pregnancy tissue has developed outside of the uterus and in a fallopian tube (tubal pregnancy).  · Tiny cysts have developed in the uterus instead of pregnancy tissue (molar pregnancy).  HOME CARE INSTRUCTIONS   Watch your condition for any changes. The following actions may help to lessen any discomfort you are feeling:  · Follow your health care provider's instructions for limiting your activity. If your health care provider orders bed rest, you may need to stay in bed and only get up to use the bathroom. However, your health care provider may allow you to continue light activity.  · If needed, make plans for someone to help with your regular activities and responsibilities while you are on bed rest.  · Keep track of the number of pads you use each day, how often you change pads, and how soaked (saturated) they are. Write this down.  · Do not use tampons. Do not douche.  · Do not have sexual intercourse or orgasms until approved by your health care provider.  · If you pass any tissue from your vagina, save the tissue so you can show it to your health care provider.  · Only take over-the-counter or prescription medicines as directed by your health care provider.  · Do not take aspirin because it can make you  bleed.  · Keep all follow-up appointments as directed by your health care provider.  SEEK MEDICAL CARE IF:  · You have any vaginal bleeding during any part of your pregnancy.  · You have cramps or labor pains.  · You have a fever, not controlled by medicine.  SEEK IMMEDIATE MEDICAL CARE IF:   · You have severe cramps in your back or belly (abdomen).  · You pass large clots or tissue from your vagina.  · Your bleeding increases.  · You feel light-headed or weak, or you have fainting episodes.  · You have chills.  · You are leaking fluid or have a gush of fluid from your vagina.  · You pass out while having a bowel movement.  MAKE SURE YOU:  · Understand these instructions.  · Will watch your condition.  · Will get help right away if you are not doing well or get worse.  Document Released: 10/18/2004 Document Revised: 01/13/2013 Document Reviewed: 09/15/2012  ExitCare® Patient Information ©2015 [employer]. This information is not intended to replace advice given to you by your health care provider. Make sure you discuss any questions you have with your health care provider.

## 2013-10-20 LAB — CBC
HEMATOCRIT: 39.2 % (ref 36.0–46.0)
HEMOGLOBIN: 13.1 g/dL (ref 12.0–15.0)
MCH: 29.7 pg (ref 26.0–34.0)
MCHC: 33.4 g/dL (ref 30.0–36.0)
MCV: 88.9 fL (ref 78.0–100.0)
Platelets: 222 10*3/uL (ref 150–400)
RBC: 4.41 MIL/uL (ref 3.87–5.11)
RDW: 14.6 % (ref 11.5–15.5)
WBC: 9.9 10*3/uL (ref 4.0–10.5)

## 2013-10-20 NOTE — MAU Provider Note (Signed)
Attestation of Attending Supervision of Advanced Practitioner (PA/CNM/NP): Evaluation and management procedures were performed by the Advanced Practitioner under my supervision and collaboration.  I have reviewed the Advanced Practitioner's note and chart, and I agree with the management and plan.  Jacob Stinson, DO Attending Physician Faculty Practice, [facility]  

## 2013-10-21 ENCOUNTER — Encounter (HOSPITAL_COMMUNITY): Payer: Self-pay | Admitting: *Deleted

## 2013-10-21 ENCOUNTER — Inpatient Hospital Stay (HOSPITAL_COMMUNITY): Payer: Medicaid Other

## 2013-10-21 ENCOUNTER — Inpatient Hospital Stay (HOSPITAL_COMMUNITY)
Admission: AD | Admit: 2013-10-21 | Discharge: 2013-10-22 | Disposition: A | Discharge status: Home / Self Care | Payer: Medicaid Other | Source: Ambulatory Visit | Attending: Obstetrics & Gynecology | Admitting: Obstetrics & Gynecology

## 2013-10-21 DIAGNOSIS — Z3A01 Less than 8 weeks gestation of pregnancy: Secondary | ICD-10-CM | POA: Insufficient documentation

## 2013-10-21 DIAGNOSIS — O039 Complete or unspecified spontaneous abortion without complication: Secondary | ICD-10-CM | POA: Diagnosis not present

## 2013-10-21 DIAGNOSIS — O209 Hemorrhage in early pregnancy, unspecified: Secondary | ICD-10-CM | POA: Diagnosis present

## 2013-10-21 LAB — CBC WITH DIFFERENTIAL/PLATELET
BASOS ABS: 0 10*3/uL (ref 0.0–0.1)
Basophils Relative: 0 % (ref 0–1)
Eosinophils Absolute: 0.1 10*3/uL (ref 0.0–0.7)
Eosinophils Relative: 1 % (ref 0–5)
HCT: 37.1 % (ref 36.0–46.0)
Hemoglobin: 12.1 g/dL (ref 12.0–15.0)
LYMPHS PCT: 27 % (ref 12–46)
Lymphs Abs: 2.6 10*3/uL (ref 0.7–4.0)
MCH: 29.4 pg (ref 26.0–34.0)
MCHC: 32.6 g/dL (ref 30.0–36.0)
MCV: 90 fL (ref 78.0–100.0)
MONO ABS: 0.7 10*3/uL (ref 0.1–1.0)
Monocytes Relative: 7 % (ref 3–12)
Neutro Abs: 6.2 10*3/uL (ref 1.7–7.7)
Neutrophils Relative %: 65 % (ref 43–77)
Platelets: 216 10*3/uL (ref 150–400)
RBC: 4.12 MIL/uL (ref 3.87–5.11)
RDW: 14.6 % (ref 11.5–15.5)
WBC: 9.7 10*3/uL (ref 4.0–10.5)

## 2013-10-21 LAB — HCG, QUANTITATIVE, PREGNANCY: hCG, Beta Chain, Quant, S: 4906 m[IU]/mL — ABNORMAL HIGH (ref <5)

## 2013-10-21 NOTE — MAU Note (Signed)
Pt states she started bleeding about" 2015 Iwas in the shower blood was running down her legs, There was not tissue in it and all of a sudden in started gushing  It was on the toilet and on the floor"

## 2013-10-21 NOTE — MAU Note (Signed)
Heavy vaginal bleeding & lower abdominal cramping since around 8 pm tonight. Saturated 1 overnight pad in an hour; and having long stringy clots.

## 2013-10-21 NOTE — Progress Notes (Signed)
Pt states she has a problem with depression, post partum when it peaks it can become pschosis

## 2013-10-21 NOTE — MAU Provider Note (Signed)
History     CSN: [id number]  Arrival date and time: 10/21/13 2112   First Provider Initiated Contact with Patient 10/21/13 2157      Chief Complaint  Patient presents with  . Vaginal Bleeding   HPI [name] is a 38 y.o. [id number] at [redacted]w[redacted]d who presents to MAU tonight with complaint of vaginal bleeding that started around 2000. She has had multiple quant hCGs with inappropriate rise. Last US showed fetal bradycardia and inappropriate interval of growth as well as a small S[place]y. She states that bleeding since onset has continued to be heavy. She has passed some clots and soaked an overnight pad in ~ 1 hour. She states that she feels weak. She denies fatigue, dizziness or fever.    OB History   Grav Para Term Preterm Abortions TAB SAB Ect Mult Living   8 3 3  0 4 0 4 0 0 3      Past Medical History  Diagnosis Date  . Diabetes mellitus   . Migraine   . Hypoactive thyroid   . Cancer     cervical  . Osteoporosis   . Hypertension   . High cholesterol   . Anginal pain 2011    "stress per Dr Pang"-occ at present from increased stress  . GERD (gastroesophageal reflux disease)   . Psoriasis     scattered throughout body- large amount- states increased with stress of surgery, followed by [name].   . Fatty liver     with gall bladder sludge by pt history/ no stones  . Mental disorder     bipolar  . Anxiety   . Ventral hernia 09/13/2011  . Depression   . Shortness of breath     with exertion/   ? adult onset asthma  . Bipolar disorder   . Family history of anesthesia complication     aggressiveness  . History of stress test 2013    non-specific angina, followed up with stress test, /w [facility], told results were WNL, no need for followup unless she was symptomatic  . Sleep apnea 01/2011     STOP BANG SCORE 6, Sleep study done at [facility]   . Stroke 2007    ? subacranoid bleed-  no defits  . Seizures     febrile seizures as a child  .  Anemia     hx of anemia during pregnancy , blood transfusion during hospitalization, for renal calculi /w sepsis   . Arthritis     ankles- recent steroid injections in both ankles, & R wrist   . Dysrhythmia     hx MVP; single episode of PAF in the setting of sepsis 06/2012 (resolved with Cardizem)  . PONV (postoperative nausea and vomiting)     also hard to get asleep per pt; difficult IV access  . Pneumonia     hx of   . Chronic kidney disease     stones in past, hosp. at [facility], 6/14, became septic, PICC line discharged from hosp., for antibiotic Rx    Past Surgical History  Procedure Laterality Date  . Cesarean section  1997& 2011  . Appendectomy    . Lipoma      total 3  . Neuroplasty / transposition median nerve at carpal tunnel bilateral    . Cervical cancer      LEAP procedure  . Kidney stone surgery    . Ventral hernia repair  10/03/2011    Procedure: HERNIA  REPAIR VENTRAL ADULT;  Surgeon: [name]. Tsuei, MD;  Location: [facility];  Service: General;  Laterality: N/A;  open ventral hernia repair with mesh  . Laparoscopy  10/22/2011    Procedure: LAPAROSCOPY DIAGNOSTIC;  Surgeon: [name]. Georgette Dover, MD;  Location: [facility];  Service: General;  Laterality: N/A;  . Hernia repair    . Eye surgery  10/2012    blepheroplasty- bilateral   . Dilation and curettage of uterus      several times   . Ventral hernia repair N/A 02/12/2013    Procedure: LAPAROSCOPIC VENTRAL HERNIA REPAIR ;  Surgeon: [name]. Georgette Dover, MD;  Location: [facility];  Service: General;  Laterality: N/A;  . Insertion of mesh N/A 02/12/2013    Procedure: INSERTION OF MESH;  Surgeon: [name]. Georgette Dover, MD;  Location: [facility];  Service: General;  Laterality: N/A;  . Laparoscopic lysis of adhesions N/A 02/12/2013    Procedure: LAPAROSCOPIC LYSIS OF ADHESIONS GREATER THAN TWO HOURS;  Surgeon: [name]. Georgette Dover, MD;  Location: [facility];  Service: General;  Laterality: N/A;    Family History  Problem Relation Age of Onset  .  Hypertension Mother   . Glaucoma Mother   . Hypertension Father   . Cancer Father     precancerious polyps  . Cancer Maternal Uncle     colon    History  Substance Use Topics  . Smoking status: Never Smoker   . Smokeless tobacco: Never Used  . Alcohol Use: No    Allergies:  Allergies  Allergen Reactions  . Cephalexin Anaphylaxis and Shortness Of Breath    But can tolerate Ceftin and Amoxicillin per patient  . Cephalexin Anaphylaxis    But can tolerate Ceftin and Amoxicillin per patient  . Latex Itching  . Lisinopril Cough and Other (See Comments)    Cough  . Simvastatin Other (See Comments)    Back pain  . Statins Other (See Comments) and Nausea And Vomiting    Back pain Back pain   . Augmentin [Amoxicillin-Pot Clavulanate] Rash    Other reaction(s): Swelling Rash, eye swelling Rash, eye swelling  . Tea Rash    tanins in tea     Prescriptions prior to admission  Medication Sig Dispense Refill  . acetaminophen (TYLENOL) 500 MG tablet Take 500 mg by mouth every 6 (six) hours as needed for moderate pain.       Marland Kitchen acyclovir (ZOVIRAX) 200 MG capsule Take 2 capsules (400 mg total) by mouth 2 (two) times daily.  30 capsule  0  . ferrous sulfate 325 (65 FE) MG EC tablet Take 325 mg by mouth daily.       Marland Kitchen GLIPIZIDE XL 2.5 MG 24 hr tablet TAKE 1 TABLET BY MOUTH DAILY WITH LUNCH.  30 tablet  2  . GlucoCom Lancets MISC As directed TID and QHS  100 each  2  . glucose blood (CHOICE DM FORA G20 TEST STRIPS) test strip Use as instructed  100 each  12  . levothyroxine (SYNTHROID) 125 MCG tablet TAKE 1 TABLET BY MOUTH DAILY BEFORE BREAKFAST.  90 tablet  3  . metFORMIN (GLUCOPHAGE) 1000 MG tablet TAKE 1 TABLET BY MOUTH 2 TIMES DAILY WITH A MEAL.  60 tablet  2  . prenatal vitamin w/FE, FA (PRENATAL 1 + 1) 27-1 MG TABS tablet Take 1 tablet by mouth daily at 12 noon.      . promethazine (PHENERGAN) 25 MG tablet Take 1 tablet (25 mg total) by mouth every  6 (six) hours as needed for nausea  or vomiting.  30 tablet  0    Review of Systems  Constitutional: Negative for fever and malaise/fatigue.  Gastrointestinal: Positive for abdominal pain.  Genitourinary:       + vaginal bleeding  Neurological: Positive for weakness. Negative for dizziness and loss of consciousness.   Physical Exam   Blood pressure 125/78, pulse 87, temperature 98.6 F (37 C), temperature source Oral, resp. rate 20, height 5\' 6"  (1.676 m), weight 316 lb 6.4 oz (143.518 kg), last menstrual period 07/09/2013, SpO2 100.00%.  Physical Exam  Constitutional: She is oriented to person, place, and time. She appears well-developed and well-nourished. No distress.  HENT:  Head: Normocephalic.  Cardiovascular: Normal rate.   Respiratory: Effort normal.  GI: Soft. She exhibits no distension and no mass. There is tenderness (mild to moderate tendernss to palpation of the lower abdomen bilaterally). There is no rebound and no guarding.  Genitourinary: There is bleeding (small amount of blood in the vaginal vault with 2 small clots near the cervical os) around the vagina. No vaginal discharge found.  Cervix: closed, thick  Neurological: She is alert and oriented to person, place, and time.  Skin: Skin is warm and dry. No erythema.  Psychiatric: She has a normal mood and affect.   Results for orders placed during the hospital encounter of 10/21/13 (from the past 24 hour(s))  HCG, QUANTITATIVE, PREGNANCY     Status: Abnormal   Collection Time    10/21/13 10:19 PM      Result Value Ref Range   hCG, Beta Chain, Quant, S 4906 (*) <5 mIU/mL  CBC WITH DIFFERENTIAL     Status: None   Collection Time    10/21/13 10:19 PM      Result Value Ref Range   WBC 9.7  4.0 - 10.5 K/uL   RBC 4.12  3.87 - 5.11 MIL/uL   Hemoglobin 12.1  12.0 - 15.0 g/dL   HCT 37.1  36.0 - 46.0 %   MCV 90.0  78.0 - 100.0 fL   MCH 29.4  26.0 - 34.0 pg   MCHC 32.6  30.0 - 36.0 g/dL   RDW 14.6  11.5 - 15.5 %   Platelets 216  150 - 400 K/uL    Neutrophils Relative % 65  43 - 77 %   Neutro Abs 6.2  1.7 - 7.7 K/uL   Lymphocytes Relative 27  12 - 46 %   Lymphs Abs 2.6  0.7 - 4.0 K/uL   Monocytes Relative 7  3 - 12 %   Monocytes Absolute 0.7  0.1 - 1.0 K/uL   Eosinophils Relative 1  0 - 5 %   Eosinophils Absolute 0.1  0.0 - 0.7 K/uL   Basophils Relative 0  0 - 1 %   Basophils Absolute 0.0  0.0 - 0.1 K/uL   US Ob Transvaginal  10/21/2013   CLINICAL DATA:  Pregnant, beta HCG 4906  EXAM: TRANSVAGINAL OB ULTRASOUND  TECHNIQUE: Transvaginal ultrasound was performed for complete evaluation of the gestation as well as the maternal uterus, adnexal regions, and pelvic cul-de-sac.  COMPARISON:  10/19/2013  FINDINGS: Intrauterine gestational sac: Visualized/normal in shape.  Yolk sac:  Not visualized  Embryo:  Present  Cardiac Activity: Not visualized  CRL:   3.1  mm   6 w 0 d  Maternal uterus/adnexae: Small subchorionic hemorrhage.  Right ovary is not discretely visualized.  Left ovary is within normal limits.  No free  fluid.  IMPRESSION: Single intrauterine gestation without cardiac activity, compatible with intrauterine fetal demise.   Electronically Signed   By: Julian Hy M.D.   On: 10/21/2013 23:36    MAU Course  Procedures None  MDM CBC, quant hCG today [place] today Discussed options with patient including expectant management vs cytotec vs D&C Patient would prefer to schedule D&C due to childcare concerns Risks of surgery were discussed and patient voices understanding Discussed with Dr. Hulan Fray. Send a message to [place] [facility] for scheduling. Patient does not need [facility] appointment.  Message sent to [place] [facility] to contact patient with appointment for [facility]  Assessment and Plan  A: SAB  P: Discharge home Bleeding precautions discussed Patient advised she will be contacted with further instructions for surgery scheduling Patient may return to MAU as needed or if her condition were to change or worsen  Luvenia Redden, PA-C   10/22/2013, 12:01 AM

## 2013-10-22 DIAGNOSIS — O039 Complete or unspecified spontaneous abortion without complication: Secondary | ICD-10-CM

## 2013-10-22 DIAGNOSIS — O209 Hemorrhage in early pregnancy, unspecified: Secondary | ICD-10-CM | POA: Diagnosis present

## 2013-10-22 DIAGNOSIS — Z3A01 Less than 8 weeks gestation of pregnancy: Secondary | ICD-10-CM | POA: Diagnosis not present

## 2013-10-22 NOTE — Discharge Instructions (Signed)
Incomplete Miscarriage A miscarriage is the sudden loss of an unborn baby (fetus) before the 20th week of pregnancy. In an incomplete miscarriage, parts of the fetus or placenta (afterbirth) remain in the body.  Having a miscarriage can be an emotional experience. Talk with your health care provider about any questions you may have about miscarrying, the grieving process, and your future pregnancy plans. CAUSES   Problems with the fetal chromosomes that make it impossible for the baby to develop normally. Problems with the baby's genes or chromosomes are most often the result of errors that occur by chance as the embryo divides and grows. The problems are not inherited from the parents.  Infection of the cervix or uterus.  Hormone problems.  Problems with the cervix, such as having an incompetent cervix. This is when the tissue in the cervix is not strong enough to hold the pregnancy.  Problems with the uterus, such as an abnormally shaped uterus, uterine fibroids, or congenital abnormalities.  Certain medical conditions.  Smoking, drinking alcohol, or taking illegal drugs.  Trauma. SYMPTOMS   Vaginal bleeding or spotting, with or without cramps or pain.  Pain or cramping in the abdomen or lower back.  Passing fluid, tissue, or blood clots from the vagina. DIAGNOSIS  Your health care provider will perform a physical exam. You may also have an ultrasound to confirm the miscarriage. Blood or urine tests may also be ordered. TREATMENT   Usually, a dilation and curettage (D&C) procedure is performed. During a D&C procedure, the cervix is widened (dilated) and any remaining fetal or placental tissue is gently removed from the uterus.  Antibiotic medicines are prescribed if there is an infection. Other medicines may be given to reduce the size of the uterus (contract) if there is a lot of bleeding.  If you have Rh negative blood and your baby was Rh positive, you will need a Rho (D)  immune globulin shot. This shot will protect any future baby from having Rh blood problems in future pregnancies.  You may be confined to bed rest. This means you should stay in bed and only get up to use the bathroom. HOME CARE INSTRUCTIONS   Rest as directed by your health care provider.  Restrict activity as directed by your health care provider. You may be allowed to continue light activity if curettage was not done but you require further treatment.  Keep track of the number of pads you use each day. Keep track of how soaked (saturated) they are. Record this information.  Do not  use tampons.  Do not douche or have sexual intercourse until approved by your health care provider.  Keep all follow-up appointments for reevaluation and continuing management.  Only take over-the-counter or prescription medicines for pain, fever, or discomfort as directed by your health care provider.  Take antibiotic medicine as directed by your health care provider. Make sure you finish it even if you start to feel better. SEEK IMMEDIATE MEDICAL CARE IF:   You experience severe cramps in your stomach, back, or abdomen.  You have an unexplained temperature (make sure to record these temperatures).  You pass large clots or tissue (save these for your health care provider to inspect).  Your bleeding increases.  You become light-headed, weak, or have fainting episodes. MAKE SURE YOU:   Understand these instructions.  Will watch your condition.  Will get help right away if you are not doing well or get worse. Document Released: 01/08/2005 Document Revised: 05/25/2013 Document Reviewed:   08/07/2012 Exit[employer] Patient Information 2015 [employer]. This information is not intended to replace advice given to you by your health care provider. Make sure you discuss any questions you have with your health care provider.  

## 2013-10-22 NOTE — Progress Notes (Signed)
Pt discharged @ 0009 in stable condition

## 2013-10-23 ENCOUNTER — Encounter (HOSPITAL_COMMUNITY): Payer: Medicaid Other | Admitting: Anesthesiology

## 2013-10-23 ENCOUNTER — Ambulatory Visit (HOSPITAL_COMMUNITY): Payer: Medicaid Other | Admitting: Anesthesiology

## 2013-10-23 ENCOUNTER — Ambulatory Visit (HOSPITAL_COMMUNITY)
Admission: RE | Admit: 2013-10-23 | Discharge: 2013-10-23 | Disposition: A | Discharge status: Home / Self Care | Payer: Medicaid Other | Source: Ambulatory Visit | Attending: Obstetrics & Gynecology | Admitting: Obstetrics & Gynecology

## 2013-10-23 ENCOUNTER — Encounter (HOSPITAL_COMMUNITY)
Admission: RE | Disposition: A | Discharge status: Home / Self Care | Payer: Self-pay | Source: Ambulatory Visit | Attending: Obstetrics & Gynecology

## 2013-10-23 DIAGNOSIS — L409 Psoriasis, unspecified: Secondary | ICD-10-CM | POA: Insufficient documentation

## 2013-10-23 DIAGNOSIS — E780 Pure hypercholesterolemia: Secondary | ICD-10-CM | POA: Diagnosis not present

## 2013-10-23 DIAGNOSIS — K760 Fatty (change of) liver, not elsewhere classified: Secondary | ICD-10-CM | POA: Insufficient documentation

## 2013-10-23 DIAGNOSIS — O021 Missed abortion: Secondary | ICD-10-CM | POA: Diagnosis not present

## 2013-10-23 DIAGNOSIS — Z888 Allergy status to other drugs, medicaments and biological substances status: Secondary | ICD-10-CM | POA: Diagnosis not present

## 2013-10-23 DIAGNOSIS — Z91018 Allergy to other foods: Secondary | ICD-10-CM | POA: Insufficient documentation

## 2013-10-23 DIAGNOSIS — Z91040 Latex allergy status: Secondary | ICD-10-CM | POA: Insufficient documentation

## 2013-10-23 DIAGNOSIS — I10 Essential (primary) hypertension: Secondary | ICD-10-CM | POA: Insufficient documentation

## 2013-10-23 DIAGNOSIS — F319 Bipolar disorder, unspecified: Secondary | ICD-10-CM | POA: Diagnosis not present

## 2013-10-23 DIAGNOSIS — E119 Type 2 diabetes mellitus without complications: Secondary | ICD-10-CM | POA: Diagnosis not present

## 2013-10-23 DIAGNOSIS — I341 Nonrheumatic mitral (valve) prolapse: Secondary | ICD-10-CM | POA: Diagnosis not present

## 2013-10-23 DIAGNOSIS — K219 Gastro-esophageal reflux disease without esophagitis: Secondary | ICD-10-CM | POA: Diagnosis not present

## 2013-10-23 DIAGNOSIS — Z881 Allergy status to other antibiotic agents status: Secondary | ICD-10-CM | POA: Insufficient documentation

## 2013-10-23 HISTORY — PX: DILATION AND EVACUATION: SHX1459

## 2013-10-23 LAB — GLUCOSE, CAPILLARY
Glucose-Capillary: 114 mg/dL — ABNORMAL HIGH (ref 70–99)
Glucose-Capillary: 126 mg/dL — ABNORMAL HIGH (ref 70–99)

## 2013-10-23 SURGERY — DILATION AND EVACUATION, UTERUS
Anesthesia: Monitor Anesthesia Care | Site: Uterus

## 2013-10-23 MED ORDER — BUPIVACAINE HCL (PF) 0.25 % IJ SOLN
INTRAMUSCULAR | Status: DC | PRN
  Administered 2013-10-23: 15 mL

## 2013-10-23 MED ORDER — LIDOCAINE HCL (CARDIAC) 20 MG/ML IV SOLN
INTRAVENOUS | Status: AC
  Filled 2013-10-23: qty 5

## 2013-10-23 MED ORDER — MIDAZOLAM HCL 2 MG/2ML IJ SOLN
INTRAMUSCULAR | Status: AC
  Filled 2013-10-23: qty 2

## 2013-10-23 MED ORDER — BUPIVACAINE-EPINEPHRINE (PF) 0.5% -1:200000 IJ SOLN
INTRAMUSCULAR | Status: AC
  Filled 2013-10-23: qty 30

## 2013-10-23 MED ORDER — FENTANYL CITRATE 0.05 MG/ML IJ SOLN: 25.0000 ug | INTRAMUSCULAR | Status: DC | PRN

## 2013-10-23 MED ORDER — DEXAMETHASONE SODIUM PHOSPHATE 10 MG/ML IJ SOLN
INTRAMUSCULAR | Status: AC
  Filled 2013-10-23: qty 1

## 2013-10-23 MED ORDER — LACTATED RINGERS IV SOLN: INTRAVENOUS | Status: DC

## 2013-10-23 MED ORDER — METOCLOPRAMIDE HCL 5 MG/ML IJ SOLN
10.0000 mg | Freq: Once | INTRAMUSCULAR | Status: AC | PRN
  Administered 2013-10-23: 10 mg via INTRAVENOUS

## 2013-10-23 MED ORDER — DOXYCYCLINE HYCLATE 100 MG IV SOLR
200.0000 mg | INTRAVENOUS | Status: AC
  Administered 2013-10-23: 200 mg via INTRAVENOUS
  Filled 2013-10-23: qty 200

## 2013-10-23 MED ORDER — PROPOFOL 10 MG/ML IV EMUL
INTRAVENOUS | Status: AC
  Filled 2013-10-23: qty 40

## 2013-10-23 MED ORDER — FENTANYL CITRATE 0.05 MG/ML IJ SOLN
INTRAMUSCULAR | Status: AC
  Filled 2013-10-23: qty 2

## 2013-10-23 MED ORDER — KETOROLAC TROMETHAMINE 30 MG/ML IJ SOLN: 15.0000 mg | Freq: Once | INTRAMUSCULAR | Status: DC | PRN

## 2013-10-23 MED ORDER — ACETAMINOPHEN-CODEINE #3 300-30 MG PO TABS: 1.0000 | ORAL_TABLET | ORAL | Status: DC | PRN

## 2013-10-23 MED ORDER — LIDOCAINE IN DEXTROSE 5-7.5 % IV SOLN
INTRAVENOUS | Status: DC | PRN
  Administered 2013-10-23: 1.3 mL via INTRATHECAL

## 2013-10-23 MED ORDER — BUPIVACAINE HCL (PF) 0.25 % IJ SOLN
INTRAMUSCULAR | Status: AC
  Filled 2013-10-23: qty 30

## 2013-10-23 MED ORDER — METOCLOPRAMIDE HCL 5 MG/ML IJ SOLN
INTRAMUSCULAR | Status: AC
  Filled 2013-10-23: qty 2

## 2013-10-23 MED ORDER — ACETAMINOPHEN 160 MG/5ML PO SOLN
ORAL | Status: AC
  Filled 2013-10-23: qty 40.6

## 2013-10-23 MED ORDER — PHENYLEPHRINE HCL 10 MG/ML IJ SOLN
INTRAMUSCULAR | Status: DC | PRN
  Administered 2013-10-23 (×2): 80 ug via INTRAVENOUS

## 2013-10-23 MED ORDER — ACETAMINOPHEN 160 MG/5ML PO SOLN
950.0000 mg | Freq: Four times a day (QID) | ORAL | Status: DC | PRN
  Administered 2013-10-23: 950 mg via ORAL

## 2013-10-23 MED ORDER — ONDANSETRON HCL 4 MG/2ML IJ SOLN
INTRAMUSCULAR | Status: AC
  Filled 2013-10-23: qty 2

## 2013-10-23 MED ORDER — KETOROLAC TROMETHAMINE 30 MG/ML IJ SOLN
INTRAMUSCULAR | Status: DC | PRN
  Administered 2013-10-23: 30 mg via INTRAVENOUS

## 2013-10-23 MED ORDER — SCOPOLAMINE 1 MG/3DAYS TD PT72
1.0000 | MEDICATED_PATCH | Freq: Once | TRANSDERMAL | Status: DC
  Administered 2013-10-23: 1.5 mg via TRANSDERMAL

## 2013-10-23 MED ORDER — ONDANSETRON HCL 4 MG/2ML IJ SOLN
INTRAMUSCULAR | Status: DC | PRN
  Administered 2013-10-23: 4 mg via INTRAVENOUS

## 2013-10-23 MED ORDER — FENTANYL CITRATE 0.05 MG/ML IJ SOLN
INTRAMUSCULAR | Status: DC | PRN
  Administered 2013-10-23 (×2): 50 ug via INTRAVENOUS

## 2013-10-23 MED ORDER — MIDAZOLAM HCL 2 MG/2ML IJ SOLN
INTRAMUSCULAR | Status: DC | PRN
  Administered 2013-10-23 (×2): 1 mg via INTRAVENOUS

## 2013-10-23 MED ORDER — LACTATED RINGERS IV SOLN
INTRAVENOUS | Status: DC
  Administered 2013-10-23 (×2): via INTRAVENOUS

## 2013-10-23 MED ORDER — LIDOCAINE IN DEXTROSE 5-7.5 % IV SOLN
INTRAVENOUS | Status: AC
  Filled 2013-10-23: qty 2

## 2013-10-23 MED ORDER — DEXAMETHASONE SODIUM PHOSPHATE 4 MG/ML IJ SOLN
INTRAMUSCULAR | Status: DC | PRN
  Administered 2013-10-23: 4 mg via INTRAVENOUS

## 2013-10-23 MED ORDER — MEPERIDINE HCL 25 MG/ML IJ SOLN: 6.2500 mg | INTRAMUSCULAR | Status: DC | PRN

## 2013-10-23 MED ORDER — SCOPOLAMINE 1 MG/3DAYS TD PT72
MEDICATED_PATCH | TRANSDERMAL | Status: AC
  Administered 2013-10-23: 1.5 mg via TRANSDERMAL
  Filled 2013-10-23: qty 1

## 2013-10-23 SURGICAL SUPPLY — 22 items
CATH FOLEY LATEX FREE 14FR (CATHETERS) ×3
CATH FOLEY LF 14FR (CATHETERS) IMPLANT
CATH ROBINSON RED A/P 16FR (CATHETERS) ×1 IMPLANT
CLOTH BEACON ORANGE TIMEOUT ST (SAFETY) ×3 IMPLANT
DECANTER SPIKE VIAL GLASS SM (MISCELLANEOUS) IMPLANT
GLOVE BIO SURGEON STRL SZ 6.5 (GLOVE) ×1 IMPLANT
GLOVE BIO SURGEONS STRL SZ 6.5 (GLOVE)
GLOVE BIOGEL PI IND STRL 7.0 (GLOVE) ×2 IMPLANT
GLOVE BIOGEL PI INDICATOR 7.0 (GLOVE) ×4
GLOVE SURG SS PI 6.5 STRL IVOR (GLOVE) ×2 IMPLANT
GOWN STRL REUS W/TWL LRG LVL3 (GOWN DISPOSABLE) ×6 IMPLANT
KIT BERKELEY 1ST TRIMESTER 3/8 (MISCELLANEOUS) ×2 IMPLANT
NS IRRIG 1000ML POUR BTL (IV SOLUTION) ×3 IMPLANT
PACK VAGINAL MINOR WOMEN LF (CUSTOM PROCEDURE TRAY) ×3 IMPLANT
PAD OB MATERNITY 4.3X12.25 (PERSONAL CARE ITEMS) ×3 IMPLANT
PAD PREP 24X48 CUFFED NSTRL (MISCELLANEOUS) ×3 IMPLANT
SET BERKELEY SUCTION TUBING (SUCTIONS) ×3 IMPLANT
TOWEL OR 17X24 6PK STRL BLUE (TOWEL DISPOSABLE) ×6 IMPLANT
VACURETTE 10 RIGID CVD (CANNULA) IMPLANT
VACURETTE 7MM CVD STRL WRAP (CANNULA) IMPLANT
VACURETTE 8 RIGID CVD (CANNULA) IMPLANT
VACURETTE 9 RIGID CVD (CANNULA) ×2 IMPLANT

## 2013-10-23 NOTE — Discharge Instructions (Signed)
°  No Ibuprofen containing products (ie. Advil, Aleve, Motrin, etc.) until after 10:00 pm tonight   Dilation and Curettage or Vacuum Curettage, Care After Refer to this sheet in the next few weeks. These instructions provide you with information on caring for yourself after your procedure. Your health care provider may also give you more specific instructions. Your treatment has been planned according to current medical practices, but problems sometimes occur. Call your health care provider if you have any problems or questions after your procedure. WHAT TO EXPECT AFTER THE PROCEDURE After your procedure, it is typical to have light cramping and bleeding. This may last for 2 days to 2 weeks after the procedure. HOME CARE INSTRUCTIONS   Do not drive for 24 hours.  Wait 1 week before returning to strenuous activities.  Take your temperature 2 times a day for 4 days and write it down. Provide these temperatures to your health care provider if you develop a fever.  Avoid long periods of standing.  Avoid heavy lifting, pushing, or pulling. Do not lift anything heavier than 10 pounds (4.5 kg).  Limit stair climbing to once or twice a day.  Take rest periods often.  You may resume your usual diet.  Drink enough fluids to keep your urine clear or pale yellow.  Your usual bowel function should return. If you have constipation, you may:  Take a mild laxative with permission from your health care provider.  Add fruit and bran to your diet.  Drink more fluids.  Take showers instead of baths until your health care provider gives you permission to take baths.  Do not go swimming or use a hot tub until your health care provider approves.  Try to have someone with you or available to you the first 24-48 hours, especially if you were given a general anesthetic.  Do not douche, use tampons, or have sex (intercourse) for 2 weeks after the procedure.  Only take over-the-counter or prescription  medicines as directed by your health care provider. Do not take aspirin. It can cause bleeding.  Follow up with your health care provider as directed. SEEK MEDICAL CARE IF:   You have increasing cramps or pain that is not relieved with medicine.  You have abdominal pain that does not seem to be related to the same area of earlier cramping and pain.  You have bad smelling vaginal discharge.  You have a rash.  You are having problems with any medicine. SEEK IMMEDIATE MEDICAL CARE IF:   You have bleeding that is heavier than a normal menstrual period.  You have a fever.  You have chest pain.  You have shortness of breath.  You feel dizzy or feel like fainting.  You pass out.  You have pain in your shoulder strap area.  You have heavy vaginal bleeding with or without blood clots. MAKE SURE YOU:   Understand these instructions.  Will watch your condition.  Will get help right away if you are not doing well or get worse. Document Released: 01/06/2000 Document Revised: 01/13/2013 Document Reviewed: 08/07/2012 [facility] Patient Information 2015 [place], [place]. This information is not intended to replace advice given to you by your health care provider. Make sure you discuss any questions you have with your health care provider.

## 2013-10-23 NOTE — Anesthesia Preprocedure Evaluation (Addendum)
Anesthesia Evaluation    History of Anesthesia Complications (+) PONV and DIFFICULT IV STICK / SPECIAL LINE  Airway       Dental   Pulmonary sleep apnea ,          Cardiovascular hypertension, + angina (s/p stress test that was WNL per pt)     Neuro/Psych  Headaches, Anxiety Depression Bipolar Disorder CVA ([facility]? 2007)    GI/Hepatic GERD-  Medicated,Fatty liver   Endo/Other  diabetes, Oral Hypoglycemic AgentsHypothyroidism Morbid obesity  Renal/GU Renal disease (h/o kidney stones w/ sepsis 06/2012)     Musculoskeletal  (+) Arthritis - (ankles, R wrist),   Abdominal   Peds  Hematology H/o blood transfusion    Anesthesia Other Findings   Reproductive/Obstetrics                          Anesthesia Physical Anesthesia Plan  ASA: III  Anesthesia Plan: Spinal   Post-op Pain Management:    Induction:   Airway Management Planned:   Additional Equipment:   Intra-op Plan:   Post-operative Plan:   Informed Consent:   Plan Discussed with: Surgeon and CRNA  Anesthesia Plan Comments:        Anesthesia Quick Evaluation

## 2013-10-23 NOTE — Anesthesia Postprocedure Evaluation (Signed)
  Anesthesia Post-op Note  Anesthesia Post Note  Patient: [name]  Procedure(s) Performed: Procedure(s) (LRB): DILATATION AND EVACUATION (N/A)  Anesthesia type: Spinal  Patient location: PACU  Post pain: Pain level controlled  Post assessment: Post-op Vital signs reviewed  Last Vitals:  Filed Vitals:   10/23/13 1745  BP: 115/57  Pulse: 76  Temp:   Resp: 15    Post vital signs: Reviewed  Level of consciousness: awake  Complications: No apparent anesthesia complications

## 2013-10-23 NOTE — Anesthesia Procedure Notes (Signed)
Spinal  Patient location during procedure: OR Start time: 10/23/2013 3:21 PM Staffing Anesthesiologist: CASSIDY, AMY Performed by: anesthesiologist  Preanesthetic Checklist Completed: patient identified, site marked, surgical consent, pre-op evaluation, timeout performed, IV checked, risks and benefits discussed and monitors and equipment checked Spinal Block Patient position: sitting Prep: site prepped and draped and DuraPrep Patient monitoring: heart rate, cardiac monitor, continuous pulse ox and blood pressure Approach: midline Location: L3-4 Injection technique: single-shot Needle Needle type: Pencan  Needle gauge: 24 G Needle length: 9 cm Assessment Sensory level: T4 Additional Notes Clear free flow CSF on first attempt.  No paresthesia.  Patient tolerated procedure well with no apparent complications. Charlton Haws, MD

## 2013-10-23 NOTE — H&P (Signed)
History   CSN: [id number]  Arrival date and time: 10/21/13 2112  First Provider Initiated Contact with Patient 10/21/13 2157  Chief Complaint   Patient presents with   .  Vaginal Bleeding   HPI  [name] is a 38 y.o. [id number] at [redacted]w[redacted]d who presents to [facility]U tonight with complaint of vaginal bleeding that started around 2000. She has had multiple quant hCGs with inappropriate rise. Last US showed fetal bradycardia and inappropriate interval of growth as well as a small [place]. She states that bleeding since onset has continued to be heavy. She has passed some clots and soaked an overnight pad in ~ 1 hour. She states that she feels weak. She denies fatigue, dizziness or fever.  OB History    Grav  Para  Term  Preterm  Abortions  TAB  SAB  Ect  Mult  Living    8  3  3   0  4  0  4  0  0  3      Past Medical History   Diagnosis  Date   .  Diabetes mellitus    .  Migraine    .  Hypoactive thyroid    .  Cancer      cervical   .  Osteoporosis    .  Hypertension    .  High cholesterol    .  Anginal pain  2011     "stress per Dr Pang"-occ at present from increased stress   .  GERD (gastroesophageal reflux disease)    .  Psoriasis      scattered throughout body- large amount- states increased with stress of surgery, followed by [name].   .  Fatty liver      with gall bladder sludge by pt history/ no stones   .  Mental disorder      bipolar   .  Anxiety    .  Ventral hernia  09/13/2011   .  Depression    .  Shortness of breath      with exertion/ ? adult onset asthma   .  Bipolar disorder    .  Family history of anesthesia complication      aggressiveness   .  History of stress test  2013     non-specific angina, followed up with stress test, /w [facility], told results were WNL, no need for followup unless she was symptomatic   .  Sleep apnea  01/2011     STOP BANG SCORE 6, Sleep study done at [place] [facility]   .  Stroke  2007     ? subacranoid bleed- no  defits   .  Seizures      febrile seizures as a child   .  Anemia      hx of anemia during pregnancy , blood transfusion during hospitalization, for renal calculi /w sepsis   .  Arthritis      ankles- recent steroid injections in both ankles, & R wrist   .  Dysrhythmia      hx MVP; single episode of PAF in the setting of sepsis 06/2012 (resolved with Cardizem)   .  PONV (postoperative nausea and vomiting)      also hard to get asleep per pt; difficult IV access   .  Pneumonia      hx of   .  Chronic kidney disease      stones in past, hosp. at [place],  6/14, became septic, PICC line discharged from hosp., for antibiotic Rx    Past Surgical History   Procedure  Laterality  Date   .  Cesarean section   1997& 2011   .  Appendectomy     .  Lipoma       total 3   .  Neuroplasty / transposition median nerve at carpal tunnel bilateral     .  Cervical cancer       LEAP procedure   .  Kidney stone surgery     .  Ventral hernia repair   10/03/2011     Procedure: HERNIA REPAIR VENTRAL ADULT; Surgeon: [name]. Tsuei, MD; Location: [facility]; Service: General; Laterality: N/A; open ventral hernia repair with mesh   .  Laparoscopy   10/22/2011     Procedure: LAPAROSCOPY DIAGNOSTIC; Surgeon: [name]. Georgette Dover, MD; Location: [facility]; Service: General; Laterality: N/A;   .  Hernia repair     .  Eye surgery   10/2012     blepheroplasty- bilateral   .  Dilation and curettage of uterus       several times   .  Ventral hernia repair  N/A  02/12/2013     Procedure: LAPAROSCOPIC VENTRAL HERNIA REPAIR ; Surgeon: [name]. Georgette Dover, MD; Location: [facility]; Service: General; Laterality: N/A;   .  Insertion of mesh  N/A  02/12/2013     Procedure: INSERTION OF MESH; Surgeon: [name]. Georgette Dover, MD; Location: [place]; Service: General; Laterality: N/A;   .  Laparoscopic lysis of adhesions  N/A  02/12/2013     Procedure: LAPAROSCOPIC LYSIS OF ADHESIONS GREATER THAN TWO HOURS; Surgeon: [name]. Georgette Dover, MD; Location: [facility];  Service: General; Laterality: N/A;    Family History   Problem  Relation  Age of Onset   .  Hypertension  Mother    .  Glaucoma  Mother    .  Hypertension  Father    .  Cancer  Father      precancerious polyps   .  Cancer  Maternal Uncle      colon    History   Substance Use Topics   .  Smoking status:  Never Smoker   .  Smokeless tobacco:  Never Used   .  Alcohol Use:  No   Allergies:  Allergies   Allergen  Reactions   .  Cephalexin  Anaphylaxis and Shortness Of Breath     But can tolerate Ceftin and Amoxicillin per patient   .  Cephalexin  Anaphylaxis     But can tolerate Ceftin and Amoxicillin per patient   .  Latex  Itching   .  Lisinopril  Cough and Other (See Comments)     Cough   .  Simvastatin  Other (See Comments)     Back pain   .  Statins  Other (See Comments) and Nausea And Vomiting     Back pain  Back pain   .  Augmentin [Amoxicillin-Pot Clavulanate]  Rash     Other reaction(s): Swelling  Rash, eye swelling  Rash, eye swelling   .  Tea  Rash     tanins in tea    Prescriptions prior to admission   Medication  Sig  Dispense  Refill   .  acetaminophen (TYLENOL) 500 MG tablet  Take 500 mg by mouth every 6 (six) hours as needed for moderate pain.     Marland Kitchen  acyclovir (ZOVIRAX) 200 MG capsule  Take 2 capsules (400 mg total) by mouth 2 (two) times daily.  30 capsule  0   .  ferrous sulfate 325 (65 FE) MG EC tablet  Take 325 mg by mouth daily.     Marland Kitchen  GLIPIZIDE XL 2.5 MG 24 hr tablet  TAKE 1 TABLET BY MOUTH DAILY WITH LUNCH.  30 tablet  2   .  GlucoCom Lancets MISC  As directed TID and QHS  100 each  2   .  glucose blood (CHOICE DM FORA G20 TEST STRIPS) test strip  Use as instructed  100 each  12   .  levothyroxine (SYNTHROID) 125 MCG tablet  TAKE 1 TABLET BY MOUTH DAILY BEFORE BREAKFAST.  90 tablet  3   .  metFORMIN (GLUCOPHAGE) 1000 MG tablet  TAKE 1 TABLET BY MOUTH 2 TIMES DAILY WITH A MEAL.  60 tablet  2   .  prenatal vitamin w/FE, FA (PRENATAL 1 + 1) 27-1 MG TABS  tablet  Take 1 tablet by mouth daily at 12 noon.     .  promethazine (PHENERGAN) 25 MG tablet  Take 1 tablet (25 mg total) by mouth every 6 (six) hours as needed for nausea or vomiting.  30 tablet  0   Review of Systems  Constitutional: Negative for fever and malaise/fatigue.  Gastrointestinal: Positive for abdominal pain.  Genitourinary:  + vaginal bleeding  Neurological: Positive for weakness. Negative for dizziness and loss of consciousness.  Physical Exam   Blood pressure 125/78, pulse 87, temperature 98.6 F (37 C), temperature source Oral, resp. rate 20, height 5\' 6"  (1.676 m), weight 316 lb 6.4 oz (143.518 kg), last menstrual period 07/09/2013, SpO2 100.00%.  Physical Exam  Constitutional: She is oriented to person, place, and time. She appears well-developed and well-nourished. No distress.  HENT:  Head: Normocephalic.  Cardiovascular: Normal rate.  Respiratory: Effort normal.  GI: Soft. She exhibits no distension and no mass. There is tenderness (mild to moderate tendernss to palpation of the lower abdomen bilaterally). There is no rebound and no guarding.  Genitourinary: There is bleeding (small amount of blood in the vaginal vault with 2 small clots near the cervical os) around the vagina. No vaginal discharge found.  Cervix: closed, thick  Neurological: She is alert and oriented to person, place, and time.  Skin: Skin is warm and dry. No erythema.  Psychiatric: She has a normal mood and affect.  Results for orders placed during the hospital encounter of 10/21/13 (from the past 24 hour(s))   HCG, QUANTITATIVE, PREGNANCY Status: Abnormal    Collection Time    10/21/13 10:19 PM   Result  Value  Ref Range    hCG, Beta Chain, Quant, S  4906 (*)  <5 mIU/mL   CBC WITH DIFFERENTIAL Status: None    Collection Time    10/21/13 10:19 PM   Result  Value  Ref Range    WBC  9.7  4.0 - 10.5 K/uL    RBC  4.12  3.87 - 5.11 MIL/uL    Hemoglobin  12.1  12.0 - 15.0 g/dL    HCT  37.1  36.0  - 46.0 %    MCV  90.0  78.0 - 100.0 fL    MCH  29.4  26.0 - 34.0 pg    MCHC  32.6  30.0 - 36.0 g/dL    RDW  14.6  11.5 - 15.5 %    Platelets  216  150 - 400 K/uL  Neutrophils Relative %  65  43 - 77 %    Neutro Abs  6.2  1.7 - 7.7 K/uL    Lymphocytes Relative  27  12 - 46 %    Lymphs Abs  2.6  0.7 - 4.0 K/uL    Monocytes Relative  7  3 - 12 %    Monocytes Absolute  0.7  0.1 - 1.0 K/uL    Eosinophils Relative  1  0 - 5 %    Eosinophils Absolute  0.1  0.0 - 0.7 K/uL    Basophils Relative  0  0 - 1 %    Basophils Absolute  0.0  0.0 - 0.1 K/uL   US Ob Transvaginal  10/21/2013 CLINICAL DATA: Pregnant, beta HCG 4906 EXAM: TRANSVAGINAL OB ULTRASOUND TECHNIQUE: Transvaginal ultrasound was performed for complete evaluation of the gestation as well as the maternal uterus, adnexal regions, and pelvic cul-de-sac. COMPARISON: 10/19/2013 FINDINGS: Intrauterine gestational sac: Visualized/normal in shape. Yolk sac: Not visualized Embryo: Present Cardiac Activity: Not visualized CRL: 3.1 mm 6 w 0 d Maternal uterus/adnexae: Small subchorionic hemorrhage. Right ovary is not discretely visualized. Left ovary is within normal limits. No free fluid. IMPRESSION: Single intrauterine gestation without cardiac activity, compatible with intrauterine fetal demise. Electronically Signed By: Julian Hy M.D. On: 10/21/2013 23:36  MAU Course   Procedures  None  MDM  CBC, quant hCG today  [place] today  Discussed options with patient including expectant management vs cytotec vs D&C  Patient would prefer to schedule D&C due to childcare concerns  Risks of surgery were discussed and patient voices understanding  Discussed with Dr. Hulan Fray. Send a message to [place] [facility] for scheduling. Patient does not need [place] appointment.  Message sent to [place] [facility] to contact patient with appointment for [facility]  Assessment and Plan   A:  SAB  P:  Suction D&E as noted above. The procedure and the risk of anesthesia, bleeding,  infection, bowel and bladder injury,were discussed and her questions were answered.  Luvenia Redden, PA-C  10/22/2013, 12:01 AM  Agree with above note Woodroe Mode, MD 10/23/2013

## 2013-10-23 NOTE — Transfer of Care (Signed)
Immediate Anesthesia Transfer of Care Note  Patient: [name]  Procedure(s) Performed: Procedure(s): DILATATION AND EVACUATION (N/A)  Patient Location: PACU  Anesthesia Type:Spinal  Level of Consciousness: awake, alert  and oriented  Airway & Oxygen Therapy:   Post-op Assessment: Report given to PACU RN and Post -op Vital signs reviewed and stable  Post vital signs: Reviewed and stable  Complications: No apparent anesthesia complications

## 2013-10-23 NOTE — Op Note (Signed)
Surgeon: Emeterio Reeve   Assistants: none  Anesthesia: Spinal anesthesia  ASA Class: 3  Procedure: Suction dilation and curettage  Preoperative diagnosis: Missed abortion at [redacted] weeks gestation  Postoperative diagnosis: Same  Estimated blood loss: 75 mL  Specimen: Products of conception  Drains: None  Counts: Correct  Patient gave written consent for suction dilation and curettage diagnosis of six-week missed abortion. Patient identification was confirmed and she was brought to the OR and spinal anesthesia was induced. She's placed in dorsal lithotomy position. Perineum and vagina were sterilely prepped and draped. Bladder was drained with red rubber catheter. Speculum was inserted and cervix was visualized. Quarter percent Marcaine was infiltrated for intracervical block. Cervix was grasped with single-tooth tenaculum. Cervix was dilated sufficiently to pass a 9 mm suction curette. Suction curettage was performed and complete evacuation of the uterine cavity was assured. Products of conception were obtained. Specimen sent to pathology. All instruments were removed. Patient tolerated procedure well without complications. Brought in stable condition to PACU.  Dr. Emeterio Reeve 10/23/2013 4 PM

## 2013-10-24 ENCOUNTER — Encounter: Payer: Self-pay | Admitting: Family Medicine

## 2013-10-26 ENCOUNTER — Encounter (HOSPITAL_COMMUNITY): Payer: Self-pay | Admitting: Obstetrics & Gynecology

## 2013-10-26 ENCOUNTER — Encounter: Payer: No Typology Code available for payment source | Admitting: Family Medicine

## 2013-10-28 ENCOUNTER — Other Ambulatory Visit: Payer: Self-pay | Admitting: Obstetrics & Gynecology

## 2013-10-29 ENCOUNTER — Telehealth: Payer: Self-pay | Admitting: *Deleted

## 2013-10-29 DIAGNOSIS — B029 Zoster without complications: Secondary | ICD-10-CM

## 2013-10-29 MED ORDER — ACYCLOVIR 200 MG PO CAPS: 400.0000 mg | ORAL_CAPSULE | Freq: Two times a day (BID) | ORAL | Status: DC

## 2013-10-29 NOTE — Telephone Encounter (Signed)
Left message for pt that I responded to her My Chart message but wanted to leave voice mail also.  Her prescription request has been sent to [facility] on [address] as requested. It may be less expensive at the [facility] and [place]. She may call back with any additional questions.

## 2013-10-30 ENCOUNTER — Encounter: Payer: Self-pay | Admitting: Obstetrics & Gynecology

## 2013-11-16 ENCOUNTER — Ambulatory Visit (INDEPENDENT_AMBULATORY_CARE_PROVIDER_SITE_OTHER): Payer: Medicaid Other | Admitting: Obstetrics & Gynecology

## 2013-11-16 VITALS — BP 120/69 | HR 84 | Temp 97.9°F | Ht 66.0 in | Wt 312.2 lb

## 2013-11-16 DIAGNOSIS — Z9889 Other specified postprocedural states: Secondary | ICD-10-CM

## 2013-11-16 DIAGNOSIS — Z09 Encounter for follow-up examination after completed treatment for conditions other than malignant neoplasm: Secondary | ICD-10-CM

## 2013-11-16 NOTE — Progress Notes (Signed)
Pt denies any bleeding or pain.  Desires to view pathology report, she is unable to open

## 2013-11-16 NOTE — Progress Notes (Signed)
Patient ID: [name], female   DOB: 11-20-1975, 38 y.o.   MRN: [id number]  Chief Complaint  Patient presents with  . Gynecologic Exam    HPI [name] is a 38 y.o. female.  S/p D&C missed Ab 6 weeks 0n 102. Wants Paragard  HPI  Past Medical History  Diagnosis Date  . Diabetes mellitus   . Migraine   . Hypoactive thyroid   . Cancer     cervical  . Osteoporosis   . Hypertension   . High cholesterol   . Anginal pain 2011    "stress per Dr Pang"-occ at present from increased stress  . GERD (gastroesophageal reflux disease)   . Psoriasis     scattered throughout body- large amount- states increased with stress of surgery, followed by [name].   . Fatty liver     with gall bladder sludge by pt history/ no stones  . Mental disorder     bipolar  . Anxiety   . Ventral hernia 09/13/2011  . Depression   . Shortness of breath     with exertion/   ? adult onset asthma  . Bipolar disorder   . Family history of anesthesia complication     aggressiveness  . History of stress test 2013    non-specific angina, followed up with stress test, /w [facility], told results were WNL, no need for followup unless she was symptomatic  . Sleep apnea 01/2011     STOP BANG SCORE 6, Sleep study done at [place] [facility]   . Stroke 2007    ? subacranoid bleed-  no defits  . Seizures     febrile seizures as a child  . Anemia     hx of anemia during pregnancy , blood transfusion during hospitalization, for renal calculi /w sepsis   . Arthritis     ankles- recent steroid injections in both ankles, & R wrist   . Dysrhythmia     hx MVP; single episode of PAF in the setting of sepsis 06/2012 (resolved with Cardizem)  . PONV (postoperative nausea and vomiting)     also hard to get asleep per pt; difficult IV access  . Pneumonia     hx of   . Chronic kidney disease     stones in past, hosp. at [place], 6/14, became septic, PICC line discharged from hosp., for  antibiotic Rx    Past Surgical History  Procedure Laterality Date  . Cesarean section  1997& 2011  . Appendectomy    . Lipoma      total 3  . Neuroplasty / transposition median nerve at carpal tunnel bilateral    . Cervical cancer      LEAP procedure  . Kidney stone surgery    . Ventral hernia repair  10/03/2011    Procedure: HERNIA REPAIR VENTRAL ADULT;  Surgeon: [name]. Tsuei, MD;  Location: [facility];  Service: General;  Laterality: N/A;  open ventral hernia repair with mesh  . Laparoscopy  10/22/2011    Procedure: LAPAROSCOPY DIAGNOSTIC;  Surgeon: [name]. Georgette Dover, MD;  Location: [facility];  Service: General;  Laterality: N/A;  . Hernia repair    . Eye surgery  10/2012    blepheroplasty- bilateral   . Dilation and curettage of uterus      several times   . Ventral hernia repair N/A 02/12/2013    Procedure: LAPAROSCOPIC VENTRAL HERNIA REPAIR ;  Surgeon: [name]. Georgette Dover, MD;  Location:  [facility];  Service: General;  Laterality: N/A;  . Insertion of mesh N/A 02/12/2013    Procedure: INSERTION OF MESH;  Surgeon: [name]. Georgette Dover, MD;  Location: [facility];  Service: General;  Laterality: N/A;  . Laparoscopic lysis of adhesions N/A 02/12/2013    Procedure: LAPAROSCOPIC LYSIS OF ADHESIONS GREATER THAN TWO HOURS;  Surgeon: [name]. Georgette Dover, MD;  Location: [facility];  Service: General;  Laterality: N/A;  . Dilation and evacuation N/A 10/23/2013    Procedure: DILATATION AND EVACUATION;  Surgeon: Woodroe Mode, MD;  Location: [facility];  Service: Gynecology;  Laterality: N/A;    Family History  Problem Relation Age of Onset  . Hypertension Mother   . Glaucoma Mother   . Hypertension Father   . Cancer Father     precancerious polyps  . Cancer Maternal Uncle     colon    Social History History  Substance Use Topics  . Smoking status: Never Smoker   . Smokeless tobacco: Never Used  . Alcohol Use: No    Allergies  Allergen Reactions  . Cephalexin Anaphylaxis and Shortness Of Breath    But can  tolerate Ceftin and Amoxicillin per patient  . Latex Itching    Also burning from waist down  . Lisinopril Cough and Other (See Comments)    Cough  . Simvastatin Other (See Comments)    Other reaction(s): Muscle Pain Back pain  . Statins Other (See Comments) and Nausea And Vomiting    Back pain Back pain   . Augmentin [Amoxicillin-Pot Clavulanate] Rash and Swelling    Other reaction(s): Swelling Rash, eye swelling Rash, eye swelling  . Tea Rash    tanins in tea     Current Outpatient Prescriptions  Medication Sig Dispense Refill  . acetaminophen (TYLENOL) 500 MG tablet Take 500 mg by mouth every 6 (six) hours as needed for moderate pain.       Marland Kitchen acyclovir (ZOVIRAX) 200 MG capsule Take 2 capsules (400 mg total) by mouth 2 (two) times daily.  120 capsule  6  . GLIPIZIDE XL 2.5 MG 24 hr tablet TAKE 1 TABLET BY MOUTH DAILY WITH LUNCH.  30 tablet  2  . GlucoCom Lancets MISC As directed TID and QHS  100 each  2  . glucose blood (CHOICE DM FORA G20 TEST STRIPS) test strip Use as instructed  100 each  12  . levothyroxine (SYNTHROID) 125 MCG tablet TAKE 1 TABLET BY MOUTH DAILY BEFORE BREAKFAST.  90 tablet  3  . metFORMIN (GLUCOPHAGE) 1000 MG tablet TAKE 1 TABLET BY MOUTH 2 TIMES DAILY WITH A MEAL.  60 tablet  2  . acetaminophen-codeine (TYLENOL #3) 300-30 MG per tablet Take 1-2 tablets by mouth every 4 (four) hours as needed for moderate pain.  15 tablet  0  . ferrous sulfate 325 (65 FE) MG EC tablet Take 325 mg by mouth daily.       . Prenatal Vit-Fe Fumarate-FA (PRENATAL MULTIVITAMIN) TABS tablet Take 1 tablet by mouth daily at 12 noon.      . promethazine (PHENERGAN) 25 MG tablet Take 1 tablet (25 mg total) by mouth every 6 (six) hours as needed for nausea or vomiting.  30 tablet  0   No current facility-administered medications for this visit.   Facility-Administered Medications Ordered in Other Visits  Medication Dose Route Frequency Provider Last Rate Last Dose  . midazolam  (VERSED) 5 MG/5ML injection    PRN Stephanie C [place] CRNA  2 mg at 08/06/11 2330    Review of Systems Review of Systems  Constitutional: Negative.   Genitourinary: Negative for vaginal bleeding, vaginal discharge, menstrual problem and pelvic pain.    Blood pressure 120/69, pulse 84, temperature 97.9 F (36.6 C), height 5\' 6"  (1.676 m), weight 312 lb 3.2 oz (141.613 kg), last menstrual period 07/09/2013.  Physical Exam Physical Exam  Constitutional: She appears well-developed. No distress.  Pulmonary/Chest: Effort normal.  Psychiatric: She has a normal mood and affect. Her behavior is normal.    Data Reviewed path report  Assessment    S/P SAB     Plan    RTC for Paragard        ARNOLD,JAMES 11/16/2013, 2:16 PM

## 2013-11-23 ENCOUNTER — Encounter: Payer: Self-pay | Admitting: Internal Medicine

## 2013-11-23 ENCOUNTER — Encounter (HOSPITAL_COMMUNITY): Payer: Self-pay | Admitting: Obstetrics & Gynecology

## 2013-11-23 ENCOUNTER — Ambulatory Visit: Payer: No Typology Code available for payment source

## 2013-12-04 ENCOUNTER — Ambulatory Visit: Payer: Self-pay | Admitting: Internal Medicine

## 2013-12-09 ENCOUNTER — Encounter: Payer: Self-pay | Admitting: Internal Medicine

## 2013-12-09 ENCOUNTER — Encounter: Payer: Self-pay | Admitting: Obstetrics & Gynecology

## 2013-12-10 ENCOUNTER — Other Ambulatory Visit: Payer: Self-pay | Admitting: Internal Medicine

## 2013-12-11 ENCOUNTER — Other Ambulatory Visit: Payer: Self-pay | Admitting: Internal Medicine

## 2013-12-11 ENCOUNTER — Other Ambulatory Visit: Payer: Self-pay | Admitting: *Deleted

## 2013-12-11 DIAGNOSIS — E119 Type 2 diabetes mellitus without complications: Secondary | ICD-10-CM

## 2013-12-11 MED ORDER — GLIPIZIDE ER 2.5 MG PO TB24: ORAL_TABLET | ORAL | Status: DC

## 2013-12-11 MED ORDER — METFORMIN HCL 1000 MG PO TABS: ORAL_TABLET | ORAL | Status: DC

## 2013-12-11 NOTE — Progress Notes (Signed)
Pt needed refills for her DM medications. I sent them to our pharmacy.

## 2013-12-14 ENCOUNTER — Encounter: Payer: No Typology Code available for payment source | Admitting: Obstetrics & Gynecology

## 2014-01-14 ENCOUNTER — Emergency Department (INDEPENDENT_AMBULATORY_CARE_PROVIDER_SITE_OTHER)
Admission: EM | Admit: 2014-01-14 | Discharge: 2014-01-14 | Disposition: A | Discharge status: Home / Self Care | Payer: Medicaid Other | Source: Home / Self Care | Attending: Family Medicine | Admitting: Family Medicine

## 2014-01-14 ENCOUNTER — Encounter (HOSPITAL_COMMUNITY): Payer: Self-pay | Admitting: Emergency Medicine

## 2014-01-14 DIAGNOSIS — R0982 Postnasal drip: Secondary | ICD-10-CM

## 2014-01-14 DIAGNOSIS — H6091 Unspecified otitis externa, right ear: Secondary | ICD-10-CM

## 2014-01-14 DIAGNOSIS — J0100 Acute maxillary sinusitis, unspecified: Secondary | ICD-10-CM

## 2014-01-14 DIAGNOSIS — L409 Psoriasis, unspecified: Secondary | ICD-10-CM

## 2014-01-14 MED ORDER — CIPROFLOXACIN-DEXAMETHASONE 0.3-0.1 % OT SUSP: 4.0000 [drp] | Freq: Two times a day (BID) | OTIC | Status: DC

## 2014-01-14 MED ORDER — ANTIPYRINE-BENZOCAINE 5.4-1.4 % OT SOLN: 3.0000 [drp] | OTIC | Status: DC | PRN

## 2014-01-14 MED ORDER — FLUCONAZOLE 150 MG PO TABS: 150.0000 mg | ORAL_TABLET | Freq: Every day | ORAL | Status: DC

## 2014-01-14 MED ORDER — IPRATROPIUM BROMIDE 0.06 % NA SOLN: 2.0000 | Freq: Four times a day (QID) | NASAL | Status: DC

## 2014-01-14 MED ORDER — AZITHROMYCIN 250 MG PO TABS: 250.0000 mg | ORAL_TABLET | Freq: Every day | ORAL | Status: DC

## 2014-01-14 NOTE — ED Notes (Signed)
Reports cough, chest congestion, right ear pain

## 2014-01-14 NOTE — Discharge Instructions (Signed)
You have an external ear canal infection. This was likely caused from compromise of the skin defense system.  This will require antibiotic drops Please use the auralgan for pain Please start the Z-pack for your cough and sinus congestion.  There is no evidence of pneumonia Please start the nasal atrovent for nasal congestion.

## 2014-01-14 NOTE — ED Provider Notes (Signed)
CSN: [id number]     Arrival date & time 01/14/14  8469 History   First MD Initiated Contact with Patient 01/14/14 [phone]     Chief Complaint  Patient presents with  . URI   (Consider location/radiation/quality/duration/timing/severity/associated sxs/prior Treatment) HPI  Chest congestion x 3 weeks. Robitussin w/ some improvement. Not getting better. Associated w/ nasal congestion and sinus pain. Associated w/ intermittent nausea and fevers. Robitussin bothers pts stomach and can cause nausea at baseline. Ibuprofen and tylenol w/ some improvement. . Denies SOB, palpitations, CP, sore throat, syncope, dizziness, Tolerating PO.  HAs.    Past Medical History  Diagnosis Date  . Diabetes mellitus   . Migraine   . Hypoactive thyroid   . Cancer     cervical  . Osteoporosis   . Hypertension   . High cholesterol   . Anginal pain 2011    "stress per Dr Pang"-occ at present from increased stress  . GERD (gastroesophageal reflux disease)   . Psoriasis     scattered throughout body- large amount- states increased with stress of surgery, followed by [name].   . Fatty liver     with gall bladder sludge by pt history/ no stones  . Mental disorder     bipolar  . Anxiety   . Ventral hernia 09/13/2011  . Depression   . Shortness of breath     with exertion/   ? adult onset asthma  . Bipolar disorder   . Family history of anesthesia complication     aggressiveness  . History of stress test 2013    non-specific angina, followed up with stress test, /w [facility], told results were WNL, no need for followup unless she was symptomatic  . Sleep apnea 01/2011     STOP BANG SCORE 6, Sleep study done at [place] [facility]   . Stroke 2007    ? subacranoid bleed-  no defits  . Seizures     febrile seizures as a child  . Anemia     hx of anemia during pregnancy , blood transfusion during hospitalization, for renal calculi /w sepsis   . Arthritis     ankles- recent steroid  injections in both ankles, & R wrist   . Dysrhythmia     hx MVP; single episode of PAF in the setting of sepsis 06/2012 (resolved with Cardizem)  . PONV (postoperative nausea and vomiting)     also hard to get asleep per pt; difficult IV access  . Pneumonia     hx of   . Chronic kidney disease     stones in past, hosp. at [facility], 6/14, became septic, PICC line discharged from hosp., for antibiotic Rx   Past Surgical History  Procedure Laterality Date  . Cesarean section  1997& 2011  . Appendectomy    . Lipoma      total 3  . Neuroplasty / transposition median nerve at carpal tunnel bilateral    . Cervical cancer      LEAP procedure  . Kidney stone surgery    . Ventral hernia repair  10/03/2011    Procedure: HERNIA REPAIR VENTRAL ADULT;  Surgeon: [name]. Tsuei, MD;  Location: [facility];  Service: General;  Laterality: N/A;  open ventral hernia repair with mesh  . Laparoscopy  10/22/2011    Procedure: LAPAROSCOPY DIAGNOSTIC;  Surgeon: [name]. Georgette Dover, MD;  Location: [facility];  Service: General;  Laterality: N/A;  . Hernia repair    . Eye  surgery  10/2012    blepheroplasty- bilateral   . Dilation and curettage of uterus      several times   . Ventral hernia repair N/A 02/12/2013    Procedure: LAPAROSCOPIC VENTRAL HERNIA REPAIR ;  Surgeon: [name]. Georgette Dover, MD;  Location: [facility];  Service: General;  Laterality: N/A;  . Insertion of mesh N/A 02/12/2013    Procedure: INSERTION OF MESH;  Surgeon: [name]. Georgette Dover, MD;  Location: [facility];  Service: General;  Laterality: N/A;  . Laparoscopic lysis of adhesions N/A 02/12/2013    Procedure: LAPAROSCOPIC LYSIS OF ADHESIONS GREATER THAN TWO HOURS;  Surgeon: [name]. Georgette Dover, MD;  Location: [facility];  Service: General;  Laterality: N/A;  . Dilation and evacuation N/A 10/23/2013    Procedure: DILATATION AND EVACUATION;  Surgeon: Woodroe Mode, MD;  Location: [facility];  Service: Gynecology;  Laterality: N/A;   Family History  Problem Relation Age of Onset   . Hypertension Mother   . Glaucoma Mother   . Hypertension Father   . Cancer Father     precancerious polyps  . Cancer Maternal Uncle     colon   History  Substance Use Topics  . Smoking status: Never Smoker   . Smokeless tobacco: Never Used  . Alcohol Use: No   OB History    Gravida Para Term Preterm AB TAB SAB Ectopic Multiple Living   8 3 3  0 4 0 4 0 0 3     Review of Systems Per HPI with all other pertinent systems negative.   Allergies  Cephalexin; Latex; Lisinopril; Simvastatin; Statins; Augmentin; and Tea  Home Medications   Prior to Admission medications   Medication Sig Start Date End Date Taking? Authorizing Provider  dextromethorphan (DELSYM) 30 MG/5ML liquid Take by mouth as needed for cough.   Yes Historical Provider, MD  glipiZIDE (GLIPIZIDE XL) 2.5 MG 24 hr tablet TAKE 1 TABLET BY MOUTH DAILY WITH LUNCH. 12/11/13  Yes Deepak Advani, MD  levothyroxine (SYNTHROID) 125 MCG tablet TAKE 1 TABLET BY MOUTH DAILY BEFORE BREAKFAST. 09/18/13  Yes Deepak Advani, MD  metFORMIN (GLUCOPHAGE) 1000 MG tablet TAKE 1 TABLET BY MOUTH 2 TIMES DAILY WITH A MEAL. 12/11/13  Yes Lorayne Marek, MD  acetaminophen (TYLENOL) 500 MG tablet Take 500 mg by mouth every 6 (six) hours as needed for moderate pain.     Historical Provider, MD  acetaminophen-codeine (TYLENOL #3) 300-30 MG per tablet Take 1-2 tablets by mouth every 4 (four) hours as needed for moderate pain. 10/23/13   Woodroe Mode, MD  acyclovir (ZOVIRAX) 200 MG capsule Take 2 capsules (400 mg total) by mouth 2 (two) times daily. 10/29/13   Woodroe Mode, MD  antipyrine-benzocaine Toniann Fail) otic solution Place 3-4 drops into the right ear every 2 (two) hours as needed for ear pain. 01/14/14   Waldemar Dickens, MD  azithromycin (ZITHROMAX) 250 MG tablet Take 1 tablet (250 mg total) by mouth daily. Take first 2 tablets together, then 1 every day until finished. 01/14/14   Waldemar Dickens, MD  ciprofloxacin-dexamethasone [facility])  otic suspension Place 4 drops into the right ear 2 (two) times daily. 01/14/14   Waldemar Dickens, MD  ferrous sulfate 325 (65 FE) MG EC tablet Take 325 mg by mouth daily.  02/19/12 02/18/13  Historical Provider, MD  GlucoCom Lancets MISC As directed TID and QHS 07/09/13   Olugbemiga E Doreene Burke, MD  glucose blood (CHOICE DM FORA G20 TEST STRIPS) test strip Use  as instructed 07/09/13   Tresa Garter, MD  ipratropium (ATROVENT) 0.06 % nasal spray Place 2 sprays into both nostrils 4 (four) times daily. 01/14/14   Waldemar Dickens, MD  Prenatal Vit-Fe Fumarate-FA (PRENATAL MULTIVITAMIN) TABS tablet Take 1 tablet by mouth daily at 12 noon.    Historical Provider, MD  promethazine (PHENERGAN) 25 MG tablet Take 1 tablet (25 mg total) by mouth every 6 (six) hours as needed for nausea or vomiting. 10/08/13   Olegario Messier, NP   BP 137/83 mmHg  Pulse 86  Temp(Src) 97.9 F (36.6 C) (Oral)  Resp 20  SpO2 95%  LMP 12/05/2013  Breastfeeding? Unknown Physical Exam  Constitutional: She is oriented to person, place, and time. She appears well-developed and well-nourished. No distress.  HENT:  R external ear canal swollen and ttp, no discharge.  Maxillary sinuses ttp bilat  Eyes: EOM are normal. Pupils are equal, round, and reactive to light.  Neck: Normal range of motion.  Cardiovascular: Normal rate, normal heart sounds and intact distal pulses.   No murmur heard. Pulmonary/Chest: Effort normal and breath sounds normal. No respiratory distress. She has no wheezes. She has no rales. She exhibits no tenderness.  Abdominal: Soft.  Musculoskeletal: Normal range of motion. She exhibits no edema or tenderness.  Neurological: She is alert and oriented to person, place, and time.  Skin: Skin is warm. She is not diaphoretic.  Diffuse psoriatic plaques on extensor surfaces of arms and around the hair line of the head and ears.   Psychiatric: She has a normal mood and affect. Her behavior is normal. Judgment and  thought content normal.    ED Course  Procedures (including critical care time) Labs Review Labs Reviewed - No data to display  Imaging Review No results found.   MDM   1. Psoriasis   2. Otitis externa, right   3. Subacute maxillary sinusitis   4. Post-nasal drip    Psoriasis - starting new therapy next week. Not currently on therapy. F/u Derm Ciprodex and auralgan for the otitis externa Chest congestion w/o sign of PNA. Sinusitis - start Zpack. Cephalosporin and PCN allergy.  Nasal atrovent  Linna Darner, MD Family Medicine 01/14/2014, 10:44 AM      Waldemar Dickens, MD 01/14/14 1045

## 2014-02-04 ENCOUNTER — Encounter (HOSPITAL_COMMUNITY): Payer: Self-pay | Admitting: Surgery

## 2014-02-12 ENCOUNTER — Ambulatory Visit: Payer: Self-pay

## 2014-02-28 ENCOUNTER — Emergency Department (INDEPENDENT_AMBULATORY_CARE_PROVIDER_SITE_OTHER)
Admission: EM | Admit: 2014-02-28 | Discharge: 2014-02-28 | Disposition: A | Discharge status: Home / Self Care | Payer: Medicaid Other | Source: Home / Self Care | Attending: Emergency Medicine | Admitting: Emergency Medicine

## 2014-02-28 ENCOUNTER — Encounter (HOSPITAL_COMMUNITY): Payer: Self-pay | Admitting: Emergency Medicine

## 2014-02-28 DIAGNOSIS — N200 Calculus of kidney: Secondary | ICD-10-CM

## 2014-02-28 DIAGNOSIS — H6091 Unspecified otitis externa, right ear: Secondary | ICD-10-CM | POA: Diagnosis not present

## 2014-02-28 LAB — POCT URINALYSIS DIP (DEVICE)
Glucose, UA: NEGATIVE mg/dL
Leukocytes, UA: NEGATIVE
Nitrite: NEGATIVE
PH: 5.5 (ref 5.0–8.0)
PROTEIN: 30 mg/dL — AB
Specific Gravity, Urine: >=1.03 (ref 1.005–1.030)
UROBILINOGEN UA: 0.2 mg/dL (ref 0.0–1.0)

## 2014-02-28 LAB — POCT I-STAT, CHEM 8
BUN: 11 mg/dL (ref 6–23)
Calcium, Ion: 1.18 mmol/L (ref 1.12–1.23)
Chloride: 104 mmol/L (ref 96–112)
Creatinine, Ser: 0.6 mg/dL (ref 0.50–1.10)
GLUCOSE: 128 mg/dL — AB (ref 70–99)
HEMATOCRIT: 43 % (ref 36.0–46.0)
HEMOGLOBIN: 14.6 g/dL (ref 12.0–15.0)
Potassium: 3.6 mmol/L (ref 3.5–5.1)
SODIUM: 141 mmol/L (ref 135–145)
TCO2: 20 mmol/L (ref 0–100)

## 2014-02-28 MED ORDER — CIPROFLOXACIN-DEXAMETHASONE 0.3-0.1 % OT SUSP: 4.0000 [drp] | Freq: Two times a day (BID) | OTIC | Status: DC

## 2014-02-28 MED ORDER — IBUPROFEN 800 MG PO TABS
800.0000 mg | ORAL_TABLET | Freq: Three times a day (TID) | ORAL | Status: DC
Start: 2014-02-28 — End: 2014-11-11

## 2014-02-28 MED ORDER — TERAZOSIN HCL 1 MG PO CAPS: 1.0000 mg | ORAL_CAPSULE | Freq: Every day | ORAL | Status: DC

## 2014-02-28 NOTE — Discharge Instructions (Signed)
Ear Drops You have been diagnosed with a condition requiring you to put drops of medicine into your outer ear. HOME CARE INSTRUCTIONS   Put drops in the affected ear as instructed. After putting the drops in, you will need to lie down with the affected ear facing up for ten minutes so the drops will remain in the ear canal and run down and fill the canal. Continue using the ear drops for as long as directed by your health care provider.  Prior to getting up, put a cotton ball gently in your ear canal. Leave enough of the cotton ball out so it can be easily removed. Do not attempt to push this down into the canal with a cotton-tipped swab or other instrument.  Do not irrigate or wash out your ears if you have had a perforated eardrum or mastoid surgery, or unless instructed to do so by your health care provider.  Keep appointments with your health care provider as instructed.  Finish all medicine, or use for the length of time prescribed by your health care provider. Continue the drops even if your problem seems to be doing well after a couple days, or continue as instructed. SEEK MEDICAL CARE IF:  You become worse or develop increasing pain.  You notice any unusual drainage from your ear (particularly if the drainage has a bad smell).  You develop hearing difficulties.  You experience a serious form of dizziness in which you feel as if the room is spinning, and you feel nauseated (vertigo).  The outside of your ear becomes red or swollen or both. This may be a sign of an allergic reaction. MAKE SURE YOU:   Understand these instructions.  Will watch your condition.  Will get help right away if you are not doing well or get worse. Document Released: 01/02/2001 Document Revised: 01/13/2013 Document Reviewed: 08/05/2012 [facility] Patient Information 2015 [place], [place]. This information is not intended to replace advice given to you by your health care provider. Make sure you discuss any  questions you have with your health care provider.  Kidney Stones Kidney stones (urolithiasis) are deposits that form inside your kidneys. The intense pain is caused by the stone moving through the urinary tract. When the stone moves, the ureter goes into spasm around the stone. The stone is usually passed in the urine.  CAUSES   A disorder that makes certain neck glands produce too much parathyroid hormone (primary hyperparathyroidism).  A buildup of uric acid crystals, similar to gout in your joints.  Narrowing (stricture) of the ureter.  A kidney obstruction present at birth (congenital obstruction).  Previous surgery on the kidney or ureters.  Numerous kidney infections. SYMPTOMS   Feeling sick to your stomach (nauseous).  Throwing up (vomiting).  Blood in the urine (hematuria).  Pain that usually spreads (radiates) to the groin.  Frequency or urgency of urination. DIAGNOSIS   Taking a history and physical exam.  Blood or urine tests.  CT scan.  Occasionally, an examination of the inside of the urinary bladder (cystoscopy) is performed. TREATMENT   Observation.  Increasing your fluid intake.  Extracorporeal shock wave lithotripsy--This is a noninvasive procedure that uses shock waves to break up kidney stones.  Surgery may be needed if you have severe pain or persistent obstruction. There are various surgical procedures. Most of the procedures are performed with the use of small instruments. Only small incisions are needed to accommodate these instruments, so recovery time is minimized. The size, location, and  chemical composition are all important variables that will determine the proper choice of action for you. Talk to your health care provider to better understand your situation so that you will minimize the risk of injury to yourself and your kidney.  HOME CARE INSTRUCTIONS   Drink enough water and fluids to keep your urine clear or pale yellow. This will  help you to pass the stone or stone fragments.  Strain all urine through the provided strainer. Keep all particulate matter and stones for your health care provider to see. The stone causing the pain may be as small as a grain of salt. It is very important to use the strainer each and every time you pass your urine. The collection of your stone will allow your health care provider to analyze it and verify that a stone has actually passed. The stone analysis will often identify what you can do to reduce the incidence of recurrences.  Only take over-the-counter or prescription medicines for pain, discomfort, or fever as directed by your health care provider.  Make a follow-up appointment with your health care provider as directed.  Get follow-up X-rays if required. The absence of pain does not always mean that the stone has passed. It may have only stopped moving. If the urine remains completely obstructed, it can cause loss of kidney function or even complete destruction of the kidney. It is your responsibility to make sure X-rays and follow-ups are completed. Ultrasounds of the kidney can show blockages and the status of the kidney. Ultrasounds are not associated with any radiation and can be performed easily in a matter of minutes. SEEK MEDICAL CARE IF:  You experience pain that is progressive and unresponsive to any pain medicine you have been prescribed. SEEK IMMEDIATE MEDICAL CARE IF:   Pain cannot be controlled with the prescribed medicine.  You have a fever or shaking chills.  The severity or intensity of pain increases over 18 hours and is not relieved by pain medicine.  You develop a new onset of abdominal pain.  You feel faint or pass out.  You are unable to urinate. MAKE SURE YOU:   Understand these instructions.  Will watch your condition.  Will get help right away if you are not doing well or get worse. Document Released: 01/08/2005 Document Revised: 09/10/2012 Document  Reviewed: 06/11/2012 [facility] Patient Information 2015 [place], [place]. This information is not intended to replace advice given to you by your health care provider. Make sure you discuss any questions you have with your health care provider.  Low-Purine Diet Purines are compounds that affect the level of uric acid in your body. A low-purine diet is a diet that is low in purines. Eating a low-purine diet can prevent the level of uric acid in your body from getting too high and causing gout or kidney stones or both. WHAT DO I NEED TO KNOW ABOUT THIS DIET?  Choose low-purine foods. Examples of low-purine foods are listed in the next section.  Drink plenty of fluids, especially water. Fluids can help remove uric acid from your body. Try to drink 8-16 cups (1.9-3.8 L) a day.  Limit foods high in fat, especially saturated fat, as fat makes it harder for the body to get rid of uric acid. Foods high in saturated fat include pizza, cheese, ice cream, whole milk, fried foods, and gravies. Choose foods that are lower in fat and lean sources of protein. Use olive oil when cooking as it contains healthy fats that are  not high in saturated fat.  Limit alcohol. Alcohol interferes with the elimination of uric acid from your body. If you are having a gout attack, avoid all alcohol.  Keep in mind that different people's bodies react differently to different foods. You will probably learn over time which foods do or do not affect you. If you discover that a food tends to cause your gout to flare up, avoid eating that food. You can more freely enjoy foods that do not cause problems. If you have any questions about a food item, talk to your dietitian or health care provider. WHICH FOODS ARE LOW, MODERATE, AND HIGH IN PURINES? The following is a list of foods that are low, moderate, and high in purines. You can eat any amount of the foods that are low in purines. You may be able to have small amounts of foods that are  moderate in purines. Ask your health care provider how much of a food moderate in purines you can have. Avoid foods high in purines. Grains  Foods low in purines: Enriched white bread, pasta, rice, cake, cornbread, popcorn.  Foods moderate in purines: Whole-grain breads and cereals, wheat germ, bran, oatmeal. Uncooked oatmeal. Dry wheat bran or wheat germ.  Foods high in purines: Pancakes, [place] toast, biscuits, muffins. Vegetables  Foods low in purines: All vegetables, except those that are moderate in purines.  Foods moderate in purines: Asparagus, cauliflower, spinach, mushrooms, green peas. Fruits  All fruits are low in purines. Meats and other Protein Foods  Foods low in purines: Eggs, nuts, peanut butter.  Foods moderate in purines: 80-90% lean beef, lamb, veal, pork, poultry, fish, eggs, peanut butter, nuts. Crab, lobster, oysters, and shrimp. Cooked dried beans, peas, and lentils.  Foods high in purines: Anchovies, sardines, herring, mussels, tuna, codfish, scallops, trout, and haddock. Berniece Salines. Organ meats (such as liver or kidney). Tripe. Game meat. Goose. Sweetbreads. Dairy  All dairy foods are low in purines. Low-fat and fat-free dairy products are best because they are low in saturated fat. Beverages  Drinks low in purines: Water, carbonated beverages, tea, coffee, cocoa.  Drinks moderate in purines: Soft drinks and other drinks sweetened with high-fructose corn syrup. Juices. To find whether a food or drink is sweetened with high-fructose corn syrup, look at the ingredients list.  Drinks high in purines: Alcoholic beverages (such as beer). Condiments  Foods low in purines: Salt, herbs, olives, pickles, relishes, vinegar.  Foods moderate in purines: Butter, margarine, oils, mayonnaise. Fats and Oils  Foods low in purines: All types, except gravies and sauces made with meat.  Foods high in purines: Gravies and sauces made with meat. Other Foods  Foods low in  purines: Sugars, sweets, gelatin. Cake. Soups made without meat.  Foods moderate in purines: Meat-based or fish-based soups, broths, or bouillons. Foods and drinks sweetened with high-fructose corn syrup.  Foods high in purines: High-fat desserts (such as ice cream, cookies, cakes, pies, doughnuts, and chocolate). Contact your dietitian for more information on foods that are not listed here. Document Released: 05/05/2010 Document Revised: 01/13/2013 Document Reviewed: 12/15/2012 [facility] Patient Information 2015 [place], [place]. This information is not intended to replace advice given to you by your health care provider. Make sure you discuss any questions you have with your health care provider.  Otitis Externa Otitis externa is a germ infection in the outer ear. The outer ear is the area from the eardrum to the outside of the ear. Otitis externa is sometimes called "swimmer's ear." HOME CARE  Put drops in the ear as told by your doctor.  Only take medicine as told by your doctor.  If you have diabetes, your doctor may give you more directions. Follow your doctor's directions.  Keep all doctor visits as told. To avoid another infection:  Keep your ear dry. Use the corner of a towel to dry your ear after swimming or bathing.  Avoid scratching or putting things inside your ear.  Avoid swimming in lakes, dirty water, or pools that use a chemical called chlorine poorly.  You may use ear drops after swimming. Combine equal amounts of white vinegar and alcohol in a bottle. Put 3 or 4 drops in each ear. GET HELP IF:   You have a fever.  Your ear is still red, puffy (swollen), or painful after 3 days.  You still have yellowish-white fluid (pus) coming from the ear after 3 days.  Your redness, puffiness, or pain gets worse.  You have a really bad headache.  You have redness, puffiness, pain, or tenderness behind your ear. MAKE SURE YOU:   Understand these instructions.  Will  watch your condition.  Will get help right away if you are not doing well or get worse. Document Released: 06/27/2007 Document Revised: 05/25/2013 Document Reviewed: 01/25/2011 [facility] Patient Information 2015 [name], [place]. This information is not intended to replace advice given to you by your health care provider. Make sure you discuss any questions you have with your health care provider.

## 2014-02-28 NOTE — ED Provider Notes (Signed)
CSN: [id number]     Arrival date & time 02/28/14  1232 History   First MD Initiated Contact with Patient 02/28/14 1302     Chief Complaint  Patient presents with  . Rash  . Nephrolithiasis   (Consider location/radiation/quality/duration/timing/severity/associated sxs/prior Treatment) HPI             39 year old female presents complaining of otitis externa in her right ear as well as a kidney stone. She reports that she has psoriasis in her ears and she occasionally will get otitis externa, she has pain and swelling of the external auditory canal on the right. No drainage. No systemic symptoms or fever. Also she has a history of chronic kidney stones. She describes pain in her back with urination only, no severe flank pain, nausea, or vomiting. She has a history of having an obstructive kidney stone which formed an abscess leading to septicemia and a two-month hospitalization so she wants to be checked out to see if she needs antibiotics to prevent this. No hematuria, dysuria, nausea, vomiting, fever.  Past Medical History  Diagnosis Date  . Diabetes mellitus   . Migraine   . Hypoactive thyroid   . Cancer     cervical  . Osteoporosis   . Hypertension   . High cholesterol   . Anginal pain 2011    "stress per Dr Pang"-occ at present from increased stress  . GERD (gastroesophageal reflux disease)   . Psoriasis     scattered throughout body- large amount- states increased with stress of surgery, followed by [name].   . Fatty liver     with gall bladder sludge by pt history/ no stones  . Mental disorder     bipolar  . Anxiety   . Ventral hernia 09/13/2011  . Depression   . Shortness of breath     with exertion/   ? adult onset asthma  . Bipolar disorder   . Family history of anesthesia complication     aggressiveness  . History of stress test 2013    non-specific angina, followed up with stress test, /w [facility], told results were WNL, no need for followup unless she was symptomatic  .  Sleep apnea 01/2011     STOP BANG SCORE 6, Sleep study done at [facility]   . Stroke 2007    ? subacranoid bleed-  no defits  . Seizures     febrile seizures as a child  . Anemia     hx of anemia during pregnancy , blood transfusion during hospitalization, for renal calculi /w sepsis   . Arthritis     ankles- recent steroid injections in both ankles, & R wrist   . Dysrhythmia     hx MVP; single episode of PAF in the setting of sepsis 06/2012 (resolved with Cardizem)  . PONV (postoperative nausea and vomiting)     also hard to get asleep per pt; difficult IV access  . Pneumonia     hx of   . Chronic kidney disease     stones in past, hosp. at [place], 6/14, became septic, PICC line discharged from hosp., for antibiotic Rx   Past Surgical History  Procedure Laterality Date  . Cesarean section  1997& 2011  . Appendectomy    . Lipoma      total 3  . Neuroplasty / transposition median nerve at carpal tunnel bilateral    . Cervical cancer      LEAP procedure  .  Kidney stone surgery    . Ventral hernia repair  10/03/2011    Procedure: HERNIA REPAIR VENTRAL ADULT;  Surgeon: [name]. Tsuei, MD;  Location: [facility];  Service: General;  Laterality: N/A;  open ventral hernia repair with mesh  . Laparoscopy  10/22/2011    Procedure: LAPAROSCOPY DIAGNOSTIC;  Surgeon: [name]. Georgette Dover, MD;  Location: [place];  Service: General;  Laterality: N/A;  . Hernia repair    . Eye surgery  10/2012    blepheroplasty- bilateral   . Dilation and curettage of uterus      several times   . Ventral hernia repair N/A 02/12/2013    Procedure: LAPAROSCOPIC VENTRAL HERNIA REPAIR ;  Surgeon: [name]. Georgette Dover, MD;  Location: [place];  Service: General;  Laterality: N/A;  . Insertion of mesh N/A 02/12/2013    Procedure: INSERTION OF MESH;  Surgeon: [name]. Georgette Dover, MD;  Location: [place];  Service: General;  Laterality: N/A;  . Laparoscopic lysis of adhesions N/A 02/12/2013    Procedure:  LAPAROSCOPIC LYSIS OF ADHESIONS GREATER THAN TWO HOURS;  Surgeon: [name]. Georgette Dover, MD;  Location: [place];  Service: General;  Laterality: N/A;  . Dilation and evacuation N/A 10/23/2013    Procedure: DILATATION AND EVACUATION;  Surgeon: Woodroe Mode, MD;  Location: [facility];  Service: Gynecology;  Laterality: N/A;   Family History  Problem Relation Age of Onset  . Hypertension Mother   . Glaucoma Mother   . Hypertension Father   . Cancer Father     precancerious polyps  . Cancer Maternal Uncle     colon   History  Substance Use Topics  . Smoking status: Never Smoker   . Smokeless tobacco: Never Used  . Alcohol Use: No   OB History    Gravida Para Term Preterm AB TAB SAB Ectopic Multiple Living   8 3 3  0 4 0 4 0 0 3     Review of Systems  HENT: Positive for ear pain. Negative for congestion and sore throat.   Genitourinary: Positive for flank pain. Negative for dysuria, urgency, frequency and hematuria.  Skin: Positive for rash (psoriasis).  All other systems reviewed and are negative.   Allergies  Cephalexin; Latex; Lisinopril; Simvastatin; Statins; Augmentin; and Tea  Home Medications   Prior to Admission medications   Medication Sig Start Date End Date Taking? Authorizing Provider  acetaminophen (TYLENOL) 500 MG tablet Take 500 mg by mouth every 6 (six) hours as needed for moderate pain.     Historical Provider, MD  acetaminophen-codeine (TYLENOL #3) 300-30 MG per tablet Take 1-2 tablets by mouth every 4 (four) hours as needed for moderate pain. 10/23/13   Woodroe Mode, MD  acyclovir (ZOVIRAX) 200 MG capsule Take 2 capsules (400 mg total) by mouth 2 (two) times daily. 10/29/13   Woodroe Mode, MD  antipyrine-benzocaine [facility]) otic solution Place 3-4 drops into the right ear every 2 (two) hours as needed for ear pain. 01/14/14   Waldemar Dickens, MD  azithromycin (ZITHROMAX) 250 MG tablet Take 1 tablet (250 mg total) by mouth daily. Take first 2 tablets together, then 1  every day until finished. 01/14/14   Waldemar Dickens, MD  ciprofloxacin-dexamethasone [facility]) otic suspension Place 4 drops into the right ear 2 (two) times daily. 01/14/14   Waldemar Dickens, MD  ciprofloxacin-dexamethasone [facility]) otic suspension Place 4 drops into the right ear 2 (two) times daily. 02/28/14   Liam Graham, PA-C  dextromethorphan (DELSYM) 30 MG/5ML liquid Take by mouth as needed for cough.    Historical Provider, MD  ferrous sulfate 325 (65 FE) MG EC tablet Take 325 mg by mouth daily.  02/19/12 02/18/13  Historical Provider, MD  fluconazole (DIFLUCAN) 150 MG tablet Take 1 tablet (150 mg total) by mouth daily. Repeat dose in 3 days 01/14/14   Waldemar Dickens, MD  glipiZIDE (GLIPIZIDE XL) 2.5 MG 24 hr tablet TAKE 1 TABLET BY MOUTH DAILY WITH LUNCH. 12/11/13   Lorayne Marek, MD  GlucoCom Lancets MISC As directed TID and QHS 07/09/13   Olugbemiga E Doreene Burke, MD  glucose blood (CHOICE DM FORA G20 TEST STRIPS) test strip Use as instructed 07/09/13   Tresa Garter, MD  ibuprofen (ADVIL,MOTRIN) 800 MG tablet Take 1 tablet (800 mg total) by mouth 3 (three) times daily. 02/28/14   Liam Graham, PA-C  ipratropium (ATROVENT) 0.06 % nasal spray Place 2 sprays into both nostrils 4 (four) times daily. 01/14/14   Waldemar Dickens, MD  levothyroxine (SYNTHROID) 125 MCG tablet TAKE 1 TABLET BY MOUTH DAILY BEFORE BREAKFAST. 09/18/13   Lorayne Marek, MD  metFORMIN (GLUCOPHAGE) 1000 MG tablet TAKE 1 TABLET BY MOUTH 2 TIMES DAILY WITH A MEAL. 12/11/13   Lorayne Marek, MD  Prenatal Vit-Fe Fumarate-FA (PRENATAL MULTIVITAMIN) TABS tablet Take 1 tablet by mouth daily at 12 noon.    Historical Provider, MD  promethazine (PHENERGAN) 25 MG tablet Take 1 tablet (25 mg total) by mouth every 6 (six) hours as needed for nausea or vomiting. 10/08/13   Olegario Messier, NP  terazosin (HYTRIN) 1 MG capsule Take 1 capsule (1 mg total) by mouth at bedtime. Increase to 2 capsules by mouth daily at bedtime in 3 days  as tolerated for kidney stone expulsion 02/28/14   Liam Graham, PA-C   BP 148/96 mmHg  Pulse 84  Temp(Src) 97.9 F (36.6 C) (Oral)  Resp 20  SpO2 97%  LMP 02/28/2014 Physical Exam  Constitutional: She is oriented to person, place, and time. Vital signs are normal. She appears well-developed and well-nourished. No distress.  HENT:  Head: Normocephalic and atraumatic.  Swelling, erythema, tenderness of the right external auditory canal.  Pulmonary/Chest: Effort normal. No respiratory distress.  Abdominal: Normal appearance. There is no tenderness. There is no CVA tenderness.  Neurological: She is alert and oriented to person, place, and time. She has normal strength. Coordination normal.  Skin: Skin is warm and dry. Rash (erythematous rash with a slight scale consistent with psoriasis on both ears as well as on the extensor surface of her elbows) noted. She is not diaphoretic.  Psychiatric: She has a normal mood and affect. Judgment normal.  Nursing note and vitals reviewed.   ED Course  Procedures (including critical care time) Labs Review Labs Reviewed  POCT URINALYSIS DIP (DEVICE) - Abnormal; Notable for the following:    Bilirubin Urine SMALL (*)    Ketones, ur TRACE (*)    Hgb urine dipstick TRACE (*)    Protein, ur 30 (*)    All other components within normal limits  POCT I-STAT, CHEM 8 - Abnormal; Notable for the following:    Glucose, Bld 128 (*)    All other components within normal limits  URINE CULTURE    Imaging Review No results found.   MDM   1. Otitis externa, right   2. Nephrolithiasis    Trace blood in the urine, otherwise labs and urinalysis are relatively normal. Increase fluids.  I refer them and terazosin alpha blocker to help with stone expulsion, Ciprodex ear drops for otitis externa follow-up when necessary    Liam Graham, PA-C 02/28/14 1350

## 2014-02-28 NOTE — ED Notes (Signed)
Patient has multiple complaints.  Reports 2 day history of issues .  Right ear infection and concern for kidney stones.   Patient has used heat and motrin.

## 2014-03-02 LAB — URINE CULTURE: Colony Count: 40000

## 2014-03-15 ENCOUNTER — Other Ambulatory Visit: Payer: Self-pay | Admitting: Internal Medicine

## 2014-03-16 ENCOUNTER — Other Ambulatory Visit: Payer: Self-pay | Admitting: Internal Medicine

## 2014-03-16 NOTE — Telephone Encounter (Signed)
Patient is requesting a refill for the following medications. Patient has an upcoming appt on Monday march 14th. Please follow up with pt.   GLIPIZIDE XL 2.5 MG 24 hr tablet metFORMIN (GLUCOPHAGE) 1000 MG tablet

## 2014-03-16 NOTE — Telephone Encounter (Signed)
Pt refills was send to [place]

## 2014-04-04 ENCOUNTER — Encounter: Payer: Self-pay | Admitting: Internal Medicine

## 2014-04-05 ENCOUNTER — Encounter: Payer: Self-pay | Admitting: Internal Medicine

## 2014-04-05 ENCOUNTER — Ambulatory Visit: Payer: Medicaid Other | Attending: Internal Medicine | Admitting: Internal Medicine

## 2014-04-05 VITALS — BP 162/98 | HR 88 | Temp 98.0°F | Resp 16 | Wt 316.0 lb

## 2014-04-05 DIAGNOSIS — L409 Psoriasis, unspecified: Secondary | ICD-10-CM | POA: Insufficient documentation

## 2014-04-05 DIAGNOSIS — E038 Other specified hypothyroidism: Secondary | ICD-10-CM

## 2014-04-05 DIAGNOSIS — N926 Irregular menstruation, unspecified: Secondary | ICD-10-CM | POA: Insufficient documentation

## 2014-04-05 DIAGNOSIS — Z23 Encounter for immunization: Secondary | ICD-10-CM | POA: Insufficient documentation

## 2014-04-05 DIAGNOSIS — Z139 Encounter for screening, unspecified: Secondary | ICD-10-CM | POA: Diagnosis not present

## 2014-04-05 DIAGNOSIS — E119 Type 2 diabetes mellitus without complications: Secondary | ICD-10-CM | POA: Insufficient documentation

## 2014-04-05 DIAGNOSIS — I10 Essential (primary) hypertension: Secondary | ICD-10-CM | POA: Insufficient documentation

## 2014-04-05 DIAGNOSIS — E139 Other specified diabetes mellitus without complications: Secondary | ICD-10-CM | POA: Diagnosis not present

## 2014-04-05 LAB — COMPLETE METABOLIC PANEL WITH GFR
ALT: 29 U/L (ref 0–35)
AST: 27 U/L (ref 0–37)
Albumin: 4.1 g/dL (ref 3.5–5.2)
Alkaline Phosphatase: 82 U/L (ref 39–117)
BUN: 12 mg/dL (ref 6–23)
CHLORIDE: 108 meq/L (ref 96–112)
CO2: 23 meq/L (ref 19–32)
Calcium: 9 mg/dL (ref 8.4–10.5)
Creat: 0.87 mg/dL (ref 0.50–1.10)
GFR, EST NON AFRICAN AMERICAN: 84 mL/min
GFR, Est African American: >89 mL/min
Glucose, Bld: 231 mg/dL — ABNORMAL HIGH (ref 70–99)
Potassium: 4.6 mEq/L (ref 3.5–5.3)
Sodium: 140 mEq/L (ref 135–145)
Total Bilirubin: 0.6 mg/dL (ref 0.2–1.2)
Total Protein: 6.8 g/dL (ref 6.0–8.3)

## 2014-04-05 LAB — POCT GLYCOSYLATED HEMOGLOBIN (HGB A1C): HEMOGLOBIN A1C: 7.6

## 2014-04-05 LAB — GLUCOSE, POCT (MANUAL RESULT ENTRY): POC GLUCOSE: 198 mg/dL — AB (ref 70–99)

## 2014-04-05 MED ORDER — LOSARTAN POTASSIUM 25 MG PO TABS: 25.0000 mg | ORAL_TABLET | Freq: Every day | ORAL | Status: DC

## 2014-04-05 MED ORDER — GLIPIZIDE ER 5 MG PO TB24: 5.0000 mg | ORAL_TABLET | Freq: Every day | ORAL | Status: DC

## 2014-04-05 NOTE — Progress Notes (Signed)
MRN: [id number] Name: [name]  Sex: female Age: 39 y.o. DOB: 10-15-1975  Allergies: Cephalexin; Latex; Lisinopril; Simvastatin; Statins; Augmentin; and Tea  Chief Complaint  Patient presents with  . Follow-up    HPI: Patient is 39 y.o. female who has history of diabetes, hypothyroidism, psoriasis, as per patient she was seen by dermatologist and was started on new medication for clinical trial stelara , she reports improvement in the symptoms but requesting repeat blood work to check on CBC and liver function test also patient has been taking her thyroid medications but reported recent more hair fall, she also history of irregular menstrual periods, she wants to be checked for pregnancy test.for diabetes she has been taking metformin and Glucotrol 2.5 mg daily, her hemoglobin A1c is slightly improved compared to last visit,also noticed her blood pressure has been trending up, she denies any headache dizziness chest pain or shortness of breath.  Past Medical History  Diagnosis Date  . Diabetes mellitus   . Migraine   . Hypoactive thyroid   . Cancer     cervical  . Osteoporosis   . Hypertension   . High cholesterol   . Anginal pain 2011    "stress per Dr Pang"-occ at present from increased stress  . GERD (gastroesophageal reflux disease)   . Psoriasis     scattered throughout body- large amount- states increased with stress of surgery, followed by [name].   . Fatty liver     with gall bladder sludge by pt history/ no stones  . Mental disorder     bipolar  . Anxiety   . Ventral hernia 09/13/2011  . Depression   . Shortness of breath     with exertion/   ? adult onset asthma  . Bipolar disorder   . Family history of anesthesia complication     aggressiveness  . History of stress test 2013    non-specific angina, followed up with stress test, /w [facility], told results were WNL, no need for followup unless she was symptomatic  . Sleep apnea 01/2011     STOP BANG SCORE  6, Sleep study done at [place] [facility]   . Stroke 2007    ? subacranoid bleed-  no defits  . Seizures     febrile seizures as a child  . Anemia     hx of anemia during pregnancy , blood transfusion during hospitalization, for renal calculi /w sepsis   . Arthritis     ankles- recent steroid injections in both ankles, & R wrist   . Dysrhythmia     hx MVP; single episode of PAF in the setting of sepsis 06/2012 (resolved with Cardizem)  . PONV (postoperative nausea and vomiting)     also hard to get asleep per pt; difficult IV access  . Pneumonia     hx of   . Chronic kidney disease     stones in past, hosp. at [place], 6/14, became septic, PICC line discharged from hosp., for antibiotic Rx    Past Surgical History  Procedure Laterality Date  . Cesarean section  1997& 2011  . Appendectomy    . Lipoma      total 3  . Neuroplasty / transposition median nerve at carpal tunnel bilateral    . Cervical cancer      LEAP procedure  . Kidney stone surgery    . Ventral hernia repair  10/03/2011    Procedure: HERNIA REPAIR VENTRAL ADULT;  Surgeon: [name]. Tsuei, MD;  Location: [facility];  Service: General;  Laterality: N/A;  open ventral hernia repair with mesh  . Laparoscopy  10/22/2011    Procedure: LAPAROSCOPY DIAGNOSTIC;  Surgeon: [name]. Georgette Dover, MD;  Location: [place];  Service: General;  Laterality: N/A;  . Hernia repair    . Eye surgery  10/2012    blepheroplasty- bilateral   . Dilation and curettage of uterus      several times   . Ventral hernia repair N/A 02/12/2013    Procedure: LAPAROSCOPIC VENTRAL HERNIA REPAIR ;  Surgeon: [name]. Georgette Dover, MD;  Location: [place];  Service: General;  Laterality: N/A;  . Insertion of mesh N/A 02/12/2013    Procedure: INSERTION OF MESH;  Surgeon: [name]. Georgette Dover, MD;  Location: [place];  Service: General;  Laterality: N/A;  . Laparoscopic lysis of adhesions N/A 02/12/2013    Procedure: LAPAROSCOPIC LYSIS OF ADHESIONS GREATER  THAN TWO HOURS;  Surgeon: [name]. Georgette Dover, MD;  Location: [place];  Service: General;  Laterality: N/A;  . Dilation and evacuation N/A 10/23/2013    Procedure: DILATATION AND EVACUATION;  Surgeon: Woodroe Mode, MD;  Location: [facility];  Service: Gynecology;  Laterality: N/A;      Medication List       This list is accurate as of: 04/05/14  6:07 PM.  Always use your most recent med list.               acetaminophen 500 MG tablet  Commonly known as:  TYLENOL  Take 500 mg by mouth every 6 (six) hours as needed for moderate pain.     acetaminophen-codeine 300-30 MG per tablet  Commonly known as:  TYLENOL #3  Take 1-2 tablets by mouth every 4 (four) hours as needed for moderate pain.     acyclovir 200 MG capsule  Commonly known as:  ZOVIRAX  Take 2 capsules (400 mg total) by mouth 2 (two) times daily.     antipyrine-benzocaine otic solution  Commonly known as:  AURALGAN  Place 3-4 drops into the right ear every 2 (two) hours as needed for ear pain.     azithromycin 250 MG tablet  Commonly known as:  ZITHROMAX  Take 1 tablet (250 mg total) by mouth daily. Take first 2 tablets together, then 1 every day until finished.     ciprofloxacin-dexamethasone otic suspension  Commonly known as:  CIPRODEX  Place 4 drops into the right ear 2 (two) times daily.     ciprofloxacin-dexamethasone otic suspension  Commonly known as:  CIPRODEX  Place 4 drops into the right ear 2 (two) times daily.     dextromethorphan 30 MG/5ML liquid  Commonly known as:  DELSYM  Take by mouth as needed for cough.     ferrous sulfate 325 (65 FE) MG EC tablet  Take 325 mg by mouth daily.     fluconazole 150 MG tablet  Commonly known as:  DIFLUCAN  Take 1 tablet (150 mg total) by mouth daily. Repeat dose in 3 days     glipiZIDE 5 MG 24 hr tablet  Commonly known as:  GLIPIZIDE XL  Take 1 tablet (5 mg total) by mouth daily with breakfast.     GlucoCom Lancets Misc  As directed TID and QHS     glucose  blood test strip  Commonly known as:  CHOICE DM FORA G20 TEST STRIPS  Use as instructed     ibuprofen 800 MG tablet  Commonly  known as:  ADVIL,MOTRIN  Take 1 tablet (800 mg total) by mouth 3 (three) times daily.     ipratropium 0.06 % nasal spray  Commonly known as:  ATROVENT  Place 2 sprays into both nostrils 4 (four) times daily.     levothyroxine 125 MCG tablet  Commonly known as:  SYNTHROID  TAKE 1 TABLET BY MOUTH DAILY BEFORE BREAKFAST.     losartan 25 MG tablet  Commonly known as:  COZAAR  Take 1 tablet (25 mg total) by mouth daily.     metFORMIN 1000 MG tablet  Commonly known as:  GLUCOPHAGE  TAKE 1 TABLET BY MOUTH 2 TIMES DAILY WITH A MEAL.     prenatal multivitamin Tabs tablet  Take 1 tablet by mouth daily at 12 noon.     promethazine 25 MG tablet  Commonly known as:  PHENERGAN  Take 1 tablet (25 mg total) by mouth every 6 (six) hours as needed for nausea or vomiting.     STELARA [place]  Inject into the skin.     terazosin 1 MG capsule  Commonly known as:  HYTRIN  Take 1 capsule (1 mg total) by mouth at bedtime. Increase to 2 capsules by mouth daily at bedtime in 3 days as tolerated for kidney stone expulsion        Meds ordered this encounter  Medications  . DISCONTD: mometasone-formoterol (DULERA) 100-5 MCG/ACT AERO    Sig: Inhale 2 puffs into the lungs 2 (two) times daily.  Marland Kitchen Ustekinumab (STELARA [place])    Sig: Inject into the skin.  Marland Kitchen glipiZIDE (GLUCOTROL XL) 5 MG 24 hr tablet    Sig: Take 1 tablet (5 mg total) by mouth daily with breakfast.    Dispense:  30 tablet    Refill:  3    PT IS OUT - CHOLT, RPH PLEASE RESPOND BY 03/17/14  . losartan (COZAAR) 25 MG tablet    Sig: Take 1 tablet (25 mg total) by mouth daily.    Dispense:  30 tablet    Refill:  3    Immunization History  Administered Date(s) Administered  . Influenza,inj,Quad PF,36+ Mos 04/05/2014  . Pneumococcal Polysaccharide-23 10/05/2011    Family History  Problem Relation Age of Onset    . Hypertension Mother   . Glaucoma Mother   . Hypertension Father   . Cancer Father     precancerious polyps  . Cancer Maternal Uncle     colon    History  Substance Use Topics  . Smoking status: Never Smoker   . Smokeless tobacco: Never Used  . Alcohol Use: No    Review of Systems   As noted in HPI  Filed Vitals:   04/05/14 1642  BP: 162/98  Pulse: 88  Temp: 98 F (36.7 C)  Resp: 16    Physical Exam  Physical Exam  Constitutional:  Obese female sitting comfortably not in acute distress  Eyes: EOM are normal. Pupils are equal, round, and reactive to light.  Cardiovascular: Normal rate and regular rhythm.   Pulmonary/Chest: Breath sounds normal. No respiratory distress. She has no wheezes. She has no rales.  Skin:  Psoriatic rash on elbows    CBC    Component Value Date/Time   WBC 9.7 10/21/2013 2219   RBC 4.12 10/21/2013 2219   HGB 14.6 02/28/2014 1332   HCT 43.0 02/28/2014 1332   PLT 216 10/21/2013 2219   MCV 90.0 10/21/2013 2219   LYMPHSABS 2.6 10/21/2013 2219   MONOABS 0.7  10/21/2013 2219   EOSABS 0.1 10/21/2013 2219   BASOSABS 0.0 10/21/2013 2219    CMP     Component Value Date/Time   NA 141 02/28/2014 1332   K 3.6 02/28/2014 1332   CL 104 02/28/2014 1332   CO2 23 06/09/2013 1240   GLUCOSE 128* 02/28/2014 1332   BUN 11 02/28/2014 1332   CREATININE 0.60 02/28/2014 1332   CREATININE 0.69 06/09/2013 1240   CALCIUM 8.5 06/09/2013 1240   PROT 6.8 06/09/2013 1240   ALBUMIN 3.9 06/09/2013 1240   AST 24 06/09/2013 1240   ALT 26 06/09/2013 1240   ALKPHOS 76 06/09/2013 1240   BILITOT 0.6 06/09/2013 1240   GFRNONAA >89 06/09/2013 1240   GFRNONAA >90 02/14/2013 0530   GFRAA >89 06/09/2013 1240   GFRAA >90 02/14/2013 0530    Lab Results  Component Value Date/Time   CHOL 117 06/09/2013 12:40 PM    No components found for: HGA1C  Lab Results  Component Value Date/Time   AST 24 06/09/2013 12:40 PM    Assessment and Plan  Other  specified diabetes mellitus without complications - Plan: Results for orders placed or performed in visit on 04/05/14  Glucose (CBG)  Result Value Ref Range   POC Glucose 198 (A) 70 - 99 mg/dl  HgB A1c  Result Value Ref Range   Hemoglobin A1C 7.60    Advised patient for diabetes meal planning, continue with metformin, I have increased the dose of Glucotrol, will repeat A1c in 3 months , HgB A1c, glipiZIDE (GLUCOTROL XL) 5 MG 24 hr tablet, COMPLETE METABOLIC PANEL WITH GFR  Psoriasis Currently following up with the dermatologist and is on Stelara  Other specified hypothyroidism - Plan: TSH  Irregular menstrual cycle - Plan: hCG, quantitative, pregnancy  Screening - Plan: Hepatitis B surface antibody, Hepatitis B surface antigen, Hepatitis C antibody, Hepatitis B surface antibody, Hepatitis B surface antigen, Hepatitis C antibody  Needs flu shot Flu shot given today  Essential hypertension - Plan: Started patient on losartan (COZAAR) 25 MG tablet, also advise for DASH diet, patient will come back in 2 weeks per nurse visit/BP check COMPLETE METABOLIC PANEL WITH GFR   Health Maintenance  -Vaccinations: Flu shot given today  Return in about 3 months (around 07/06/2014) for diabetes, hypertension.   This note has been created with Surveyor, quantity. Any transcriptional errors are unintentional.    Lorayne Marek, MD

## 2014-04-05 NOTE — Progress Notes (Signed)
Patient here for follow up on her diabetes  Has multiple questions that she wants the dr to address through my chart That she sent  Is currently on stelera

## 2014-04-05 NOTE — Patient Instructions (Signed)
DASH Eating Plan DASH stands for "Dietary Approaches to Stop Hypertension." The DASH eating plan is a healthy eating plan that has been shown to reduce high blood pressure (hypertension). Additional health benefits may include reducing the risk of type 2 diabetes mellitus, heart disease, and stroke. The DASH eating plan may also help with weight loss. WHAT DO I NEED TO KNOW ABOUT THE DASH EATING PLAN? For the DASH eating plan, you will follow these general guidelines:  Choose foods with a percent daily value for sodium of less than 5% (as listed on the food label).  Use salt-free seasonings or herbs instead of table salt or sea salt.  Check with your health care provider or pharmacist before using salt substitutes.  Eat lower-sodium products, often labeled as "lower sodium" or "no salt added."  Eat fresh foods.  Eat more vegetables, fruits, and low-fat dairy products.  Choose whole grains. Look for the word "whole" as the first word in the ingredient list.  Choose fish and skinless chicken or turkey more often than red meat. Limit fish, poultry, and meat to 6 oz (170 g) each day.  Limit sweets, desserts, sugars, and sugary drinks.  Choose heart-healthy fats.  Limit cheese to 1 oz (28 g) per day.  Eat more home-cooked food and less restaurant, buffet, and fast food.  Limit fried foods.  Cook foods using methods other than frying.  Limit canned vegetables. If you do use them, rinse them well to decrease the sodium.  When eating at a restaurant, ask that your food be prepared with less salt, or no salt if possible. WHAT FOODS CAN I EAT? Seek help from a dietitian for individual calorie needs. Grains Whole grain or whole wheat bread. Brown rice. Whole grain or whole wheat pasta. Quinoa, bulgur, and whole grain cereals. Low-sodium cereals. Corn or whole wheat flour tortillas. Whole grain cornbread. Whole grain crackers. Low-sodium crackers. Vegetables Fresh or frozen vegetables  (raw, steamed, roasted, or grilled). Low-sodium or reduced-sodium tomato and vegetable juices. Low-sodium or reduced-sodium tomato sauce and paste. Low-sodium or reduced-sodium canned vegetables.  Fruits All fresh, canned (in natural juice), or frozen fruits. Meat and Other Protein Products Ground beef (85% or leaner), grass-fed beef, or beef trimmed of fat. Skinless chicken or turkey. Ground chicken or turkey. Pork trimmed of fat. All fish and seafood. Eggs. Dried beans, peas, or lentils. Unsalted nuts and seeds. Unsalted canned beans. Dairy Low-fat dairy products, such as skim or 1% milk, 2% or reduced-fat cheeses, low-fat ricotta or cottage cheese, or plain low-fat yogurt. Low-sodium or reduced-sodium cheeses. Fats and Oils Tub margarines without trans fats. Light or reduced-fat mayonnaise and salad dressings (reduced sodium). Avocado. Safflower, olive, or canola oils. Natural peanut or almond butter. Other Unsalted popcorn and pretzels. The items listed above may not be a complete list of recommended foods or beverages. Contact your dietitian for more options. WHAT FOODS ARE NOT RECOMMENDED? Grains White bread. White pasta. White rice. Refined cornbread. Bagels and croissants. Crackers that contain trans fat. Vegetables Creamed or fried vegetables. Vegetables in a cheese sauce. Regular canned vegetables. Regular canned tomato sauce and paste. Regular tomato and vegetable juices. Fruits Dried fruits. Canned fruit in light or heavy syrup. Fruit juice. Meat and Other Protein Products Fatty cuts of meat. Ribs, chicken wings, bacon, sausage, bologna, salami, chitterlings, fatback, hot dogs, bratwurst, and packaged luncheon meats. Salted nuts and seeds. Canned beans with salt. Dairy Whole or 2% milk, cream, half-and-half, and cream cheese. Whole-fat or sweetened yogurt. Full-fat   cheeses or blue cheese. Nondairy creamers and whipped toppings. Processed cheese, cheese spreads, or cheese  curds. Condiments Onion and garlic salt, seasoned salt, table salt, and sea salt. Canned and packaged gravies. Worcestershire sauce. Tartar sauce. Barbecue sauce. Teriyaki sauce. Soy sauce, including reduced sodium. Steak sauce. Fish sauce. Oyster sauce. Cocktail sauce. Horseradish. Ketchup and mustard. Meat flavorings and tenderizers. Bouillon cubes. Hot sauce. Tabasco sauce. Marinades. Taco seasonings. Relishes. Fats and Oils Butter, stick margarine, lard, shortening, ghee, and bacon fat. Coconut, palm kernel, or palm oils. Regular salad dressings. Other Pickles and olives. Salted popcorn and pretzels. The items listed above may not be a complete list of foods and beverages to avoid. Contact your dietitian for more information. WHERE CAN I FIND MORE INFORMATION? National Heart, Lung, and Blood Institute: www.nhlbi.nih.gov/health/health-topics/topics/dash/ Document Released: 12/28/2010 Document Revised: 05/25/2013 Document Reviewed: 11/12/2012 Exit[employer] Patient Information 2015 [employer]. This information is not intended to replace advice given to you by your health care provider. Make sure you discuss any questions you have with your health care provider. Diabetes Mellitus and Food It is important for you to manage your blood sugar (glucose) level. Your blood glucose level can be greatly affected by what you eat. Eating healthier foods in the appropriate amounts throughout the day at about the same time each day will help you control your blood glucose level. It can also help slow or prevent worsening of your diabetes mellitus. Healthy eating may even help you improve the level of your blood pressure and reach or maintain a healthy weight.  HOW CAN FOOD AFFECT ME? Carbohydrates Carbohydrates affect your blood glucose level more than any other type of food. Your dietitian will help you determine how many carbohydrates to eat at each meal and teach you how to count carbohydrates. Counting  carbohydrates is important to keep your blood glucose at a healthy level, especially if you are using insulin or taking certain medicines for diabetes mellitus. Alcohol Alcohol can cause sudden decreases in blood glucose (hypoglycemia), especially if you use insulin or take certain medicines for diabetes mellitus. Hypoglycemia can be a life-threatening condition. Symptoms of hypoglycemia (sleepiness, dizziness, and disorientation) are similar to symptoms of having too much alcohol.  If your health care provider has given you approval to drink alcohol, do so in moderation and use the following guidelines:  Women should not have more than one drink per day, and men should not have more than two drinks per day. One drink is equal to:  12 oz of beer.  5 oz of wine.  1 oz of hard liquor.  Do not drink on an empty stomach.  Keep yourself hydrated. Have water, diet soda, or unsweetened iced tea.  Regular soda, juice, and other mixers might contain a lot of carbohydrates and should be counted. WHAT FOODS ARE NOT RECOMMENDED? As you make food choices, it is important to remember that all foods are not the same. Some foods have fewer nutrients per serving than other foods, even though they might have the same number of calories or carbohydrates. It is difficult to get your body what it needs when you eat foods with fewer nutrients. Examples of foods that you should avoid that are high in calories and carbohydrates but low in nutrients include:  Trans fats (most processed foods list trans fats on the Nutrition Facts label).  Regular soda.  Juice.  Candy.  Sweets, such as cake, pie, doughnuts, and cookies.  Fried foods. WHAT FOODS CAN I EAT? Have nutrient-rich foods,   which will nourish your body and keep you healthy. The food you should eat also will depend on several factors, including:  The calories you need.  The medicines you take.  Your weight.  Your blood glucose level.  Your  blood pressure level.  Your cholesterol level. You also should eat a variety of foods, including:  Protein, such as meat, poultry, fish, tofu, nuts, and seeds (lean animal proteins are best).  Fruits.  Vegetables.  Dairy products, such as milk, cheese, and yogurt (low fat is best).  Breads, grains, pasta, cereal, rice, and beans.  Fats such as olive oil, trans fat-free margarine, canola oil, avocado, and olives. DOES EVERYONE WITH DIABETES MELLITUS HAVE THE SAME MEAL PLAN? Because every person with diabetes mellitus is different, there is not one meal plan that works for everyone. It is very important that you meet with a dietitian who will help you create a meal plan that is just right for you. Document Released: 10/05/2004 Document Revised: 01/13/2013 Document Reviewed: 12/05/2012 Exit[employer] Patient Information 2015 [employer]. This information is not intended to replace advice given to you by your health care provider. Make sure you discuss any questions you have with your health care provider.  

## 2014-04-06 LAB — HEPATITIS B SURFACE ANTIGEN: Hepatitis B Surface Ag: NEGATIVE

## 2014-04-06 LAB — HEPATITIS B SURFACE ANTIBODY,QUALITATIVE: Hep B S Ab: NEGATIVE

## 2014-04-06 LAB — HEPATITIS C ANTIBODY: HCV Ab: NEGATIVE

## 2014-04-06 LAB — HCG, QUANTITATIVE, PREGNANCY

## 2014-04-06 LAB — TSH: TSH: 2.784 u[IU]/mL (ref 0.350–4.500)

## 2014-04-07 ENCOUNTER — Encounter: Payer: Self-pay | Admitting: Internal Medicine

## 2014-04-07 ENCOUNTER — Telehealth: Payer: Self-pay

## 2014-04-07 NOTE — Telephone Encounter (Signed)
Patient not available Left message on voice mail to return our call 

## 2014-04-07 NOTE — Telephone Encounter (Signed)
-----   Message from Lorayne Marek, MD sent at 04/06/2014  1:28 PM EDT ----- Call and let the patient know that her hepatitis panel is negative.pregnancy test is negative. Her TSH level is in normal range, continue with current dose of levothyroxine, will repeat the test on the following visit.

## 2014-04-08 ENCOUNTER — Telehealth: Payer: Self-pay

## 2014-04-08 ENCOUNTER — Telehealth: Payer: Self-pay | Admitting: Internal Medicine

## 2014-04-08 NOTE — Telephone Encounter (Signed)
Returned patient phone call Patient not available Left message on voice mail to return our call 

## 2014-04-08 NOTE — Telephone Encounter (Signed)
Pt is calling returning nurse's phone call. Please f/u with pt

## 2014-04-23 ENCOUNTER — Ambulatory Visit: Payer: Self-pay | Attending: Internal Medicine | Admitting: Family Medicine

## 2014-04-23 VITALS — BP 128/84 | HR 91 | Temp 98.0°F | Resp 16

## 2014-04-23 DIAGNOSIS — J019 Acute sinusitis, unspecified: Secondary | ICD-10-CM | POA: Insufficient documentation

## 2014-04-23 MED ORDER — SULFAMETHOXAZOLE-TRIMETHOPRIM 800-160 MG PO TABS: 1.0000 | ORAL_TABLET | Freq: Two times a day (BID) | ORAL | Status: DC

## 2014-04-23 NOTE — Patient Instructions (Addendum)
DASH Eating Plan DASH stands for "Dietary Approaches to Stop Hypertension." The DASH eating plan is a healthy eating plan that has been shown to reduce high blood pressure (hypertension). Additional health benefits may include reducing the risk of type 2 diabetes mellitus, heart disease, and stroke. The DASH eating plan may also help with weight loss. WHAT DO I NEED TO KNOW ABOUT THE DASH EATING PLAN? For the DASH eating plan, you will follow these general guidelines:  Choose foods with a percent daily value for sodium of less than 5% (as listed on the food label).  Use salt-free seasonings or herbs instead of table salt or sea salt.  Check with your health care provider or pharmacist before using salt substitutes.  Eat lower-sodium products, often labeled as "lower sodium" or "no salt added."  Eat fresh foods.  Eat more vegetables, fruits, and low-fat dairy products.  Choose whole grains. Look for the word "whole" as the first word in the ingredient list.  Choose fish and skinless chicken or turkey more often than red meat. Limit fish, poultry, and meat to 6 oz (170 g) each day.  Limit sweets, desserts, sugars, and sugary drinks.  Choose heart-healthy fats.  Limit cheese to 1 oz (28 g) per day.  Eat more home-cooked food and less restaurant, buffet, and fast food.  Limit fried foods.  Cook foods using methods other than frying.  Limit canned vegetables. If you do use them, rinse them well to decrease the sodium.  When eating at a restaurant, ask that your food be prepared with less salt, or no salt if possible. WHAT FOODS CAN I EAT? Seek help from a dietitian for individual calorie needs. Grains Whole grain or whole wheat bread. Brown rice. Whole grain or whole wheat pasta. Quinoa, bulgur, and whole grain cereals. Low-sodium cereals. Corn or whole wheat flour tortillas. Whole grain cornbread. Whole grain crackers. Low-sodium crackers. Vegetables Fresh or frozen vegetables  (raw, steamed, roasted, or grilled). Low-sodium or reduced-sodium tomato and vegetable juices. Low-sodium or reduced-sodium tomato sauce and paste. Low-sodium or reduced-sodium canned vegetables.  Fruits All fresh, canned (in natural juice), or frozen fruits. Meat and Other Protein Products Ground beef (85% or leaner), grass-fed beef, or beef trimmed of fat. Skinless chicken or turkey. Ground chicken or turkey. Pork trimmed of fat. All fish and seafood. Eggs. Dried beans, peas, or lentils. Unsalted nuts and seeds. Unsalted canned beans. Dairy Low-fat dairy products, such as skim or 1% milk, 2% or reduced-fat cheeses, low-fat ricotta or cottage cheese, or plain low-fat yogurt. Low-sodium or reduced-sodium cheeses. Fats and Oils Tub margarines without trans fats. Light or reduced-fat mayonnaise and salad dressings (reduced sodium). Avocado. Safflower, olive, or canola oils. Natural peanut or almond butter. Other Unsalted popcorn and pretzels. The items listed above may not be a complete list of recommended foods or beverages. Contact your dietitian for more options. WHAT FOODS ARE NOT RECOMMENDED? Grains White bread. White pasta. White rice. Refined cornbread. Bagels and croissants. Crackers that contain trans fat. Vegetables Creamed or fried vegetables. Vegetables in a cheese sauce. Regular canned vegetables. Regular canned tomato sauce and paste. Regular tomato and vegetable juices. Fruits Dried fruits. Canned fruit in light or heavy syrup. Fruit juice. Meat and Other Protein Products Fatty cuts of meat. Ribs, chicken wings, bacon, sausage, bologna, salami, chitterlings, fatback, hot dogs, bratwurst, and packaged luncheon meats. Salted nuts and seeds. Canned beans with salt. Dairy Whole or 2% milk, cream, half-and-half, and cream cheese. Whole-fat or sweetened yogurt. Full-fat   cheeses or blue cheese. Nondairy creamers and whipped toppings. Processed cheese, cheese spreads, or cheese  curds. Condiments Onion and garlic salt, seasoned salt, table salt, and sea salt. Canned and packaged gravies. Worcestershire sauce. Tartar sauce. Barbecue sauce. Teriyaki sauce. Soy sauce, including reduced sodium. Steak sauce. Fish sauce. Oyster sauce. Cocktail sauce. Horseradish. Ketchup and mustard. Meat flavorings and tenderizers. Bouillon cubes. Hot sauce. Tabasco sauce. Marinades. Taco seasonings. Relishes. Fats and Oils Butter, stick margarine, lard, shortening, ghee, and bacon fat. Coconut, palm kernel, or palm oils. Regular salad dressings. Other Pickles and olives. Salted popcorn and pretzels. The items listed above may not be a complete list of foods and beverages to avoid. Contact your dietitian for more information. WHERE CAN I FIND MORE INFORMATION? National Heart, Lung, and Blood Institute: travelstabloid.com Document Released: 12/28/2010 Document Revised: 05/25/2013 Document Reviewed: 11/12/2012 [facility] Patient Information 2015 [place], [place]. This information is not intended to replace advice given to you by your health care provider. Make sure you discuss any questions you have with your health care provider.   1. Continue symptomatic measures at home. 2. Avoid sudafed. Take coriciden 3. Warm mist 4. Follow-up as needed.

## 2014-04-23 NOTE — Progress Notes (Signed)
Patient ID: [name], female   DOB: [birth year], 39 y.o.   MRN: [id number] CC: Cough, congestion  Subjective:  Started with mild dry cough about 2 weeks ago. Then developed head congestion. Did have some chills and fever. As developed facial pressure and pain and neck ache. Cough has worsened and gotten wet. Mucus is from clear to green with some blood. There is some facial pain and a thick post-nasal discharge. She concerned because she was recently exposed to pneumonia and TB bacteria in a school lab.  She is here today for a nurse BP check and offers these complaints ROS: see HPI  Assessment:  Acute sinusitis  Plan:  Septra DS BID x ten days. Continue symptomatic measures, except switch from Sudafed to coricidin Follow-up prn  Micheline Chapman, FNP [facility].

## 2014-04-23 NOTE — Progress Notes (Signed)
Patient presents for BP check Med list reviewed; states taking all meds as directed Discussed need for low sodium diet and using Mrs. Dash as alternative to salt Encouraged to choose foods with 5% or less of daily value for sodium. Discussed walking 30 minutes per day for exercise Patient c/o 2 week hx sinus sx with nasal congestion, green nasal drainage, fever 101, chills, aches, headaches,  SHOB, chest pain,cough productive of green-blood tinged sputum  BP 128/84  left arm manually with large cuff P 91 R  16 T  98.0 oral SPO2 96 %  Patient advised to call for med refills at least 7 days before running out so as not to go without.  Patient aware that is to f/u with PCP 3 months from last visit (Due 6/14 /16)  Patient given literature on [facility]

## 2014-06-03 ENCOUNTER — Encounter: Payer: Self-pay | Admitting: Internal Medicine

## 2014-06-04 ENCOUNTER — Ambulatory Visit: Payer: Medicaid Other | Attending: Internal Medicine

## 2014-06-04 ENCOUNTER — Other Ambulatory Visit: Payer: Self-pay | Admitting: Internal Medicine

## 2014-06-28 ENCOUNTER — Encounter: Payer: Self-pay | Admitting: Internal Medicine

## 2014-07-01 ENCOUNTER — Encounter (HOSPITAL_COMMUNITY): Payer: Self-pay | Admitting: Surgery

## 2014-07-03 ENCOUNTER — Emergency Department (HOSPITAL_COMMUNITY)
Admission: EM | Admit: 2014-07-03 | Discharge: 2014-07-03 | Disposition: A | Discharge status: Home / Self Care | Payer: Self-pay | Attending: Emergency Medicine | Admitting: Emergency Medicine

## 2014-07-03 ENCOUNTER — Encounter (HOSPITAL_COMMUNITY): Payer: Self-pay | Admitting: Emergency Medicine

## 2014-07-03 DIAGNOSIS — F419 Anxiety disorder, unspecified: Secondary | ICD-10-CM | POA: Insufficient documentation

## 2014-07-03 DIAGNOSIS — Z8701 Personal history of pneumonia (recurrent): Secondary | ICD-10-CM | POA: Insufficient documentation

## 2014-07-03 DIAGNOSIS — R739 Hyperglycemia, unspecified: Secondary | ICD-10-CM

## 2014-07-03 DIAGNOSIS — Z91040 Latex allergy status: Secondary | ICD-10-CM | POA: Insufficient documentation

## 2014-07-03 DIAGNOSIS — Z79899 Other long term (current) drug therapy: Secondary | ICD-10-CM | POA: Insufficient documentation

## 2014-07-03 DIAGNOSIS — M1990 Unspecified osteoarthritis, unspecified site: Secondary | ICD-10-CM | POA: Insufficient documentation

## 2014-07-03 DIAGNOSIS — Z3202 Encounter for pregnancy test, result negative: Secondary | ICD-10-CM | POA: Insufficient documentation

## 2014-07-03 DIAGNOSIS — Z8673 Personal history of transient ischemic attack (TIA), and cerebral infarction without residual deficits: Secondary | ICD-10-CM | POA: Insufficient documentation

## 2014-07-03 DIAGNOSIS — I129 Hypertensive chronic kidney disease with stage 1 through stage 4 chronic kidney disease, or unspecified chronic kidney disease: Secondary | ICD-10-CM | POA: Insufficient documentation

## 2014-07-03 DIAGNOSIS — N189 Chronic kidney disease, unspecified: Secondary | ICD-10-CM | POA: Insufficient documentation

## 2014-07-03 DIAGNOSIS — Z8719 Personal history of other diseases of the digestive system: Secondary | ICD-10-CM | POA: Insufficient documentation

## 2014-07-03 DIAGNOSIS — Z792 Long term (current) use of antibiotics: Secondary | ICD-10-CM | POA: Insufficient documentation

## 2014-07-03 DIAGNOSIS — G4730 Sleep apnea, unspecified: Secondary | ICD-10-CM | POA: Insufficient documentation

## 2014-07-03 DIAGNOSIS — Z872 Personal history of diseases of the skin and subcutaneous tissue: Secondary | ICD-10-CM | POA: Insufficient documentation

## 2014-07-03 DIAGNOSIS — E1165 Type 2 diabetes mellitus with hyperglycemia: Secondary | ICD-10-CM | POA: Insufficient documentation

## 2014-07-03 DIAGNOSIS — Z9981 Dependence on supplemental oxygen: Secondary | ICD-10-CM | POA: Insufficient documentation

## 2014-07-03 DIAGNOSIS — G43909 Migraine, unspecified, not intractable, without status migrainosus: Secondary | ICD-10-CM | POA: Insufficient documentation

## 2014-07-03 DIAGNOSIS — E039 Hypothyroidism, unspecified: Secondary | ICD-10-CM | POA: Insufficient documentation

## 2014-07-03 DIAGNOSIS — D649 Anemia, unspecified: Secondary | ICD-10-CM | POA: Insufficient documentation

## 2014-07-03 DIAGNOSIS — Z8541 Personal history of malignant neoplasm of cervix uteri: Secondary | ICD-10-CM | POA: Insufficient documentation

## 2014-07-03 LAB — CBC WITH DIFFERENTIAL/PLATELET
BASOS PCT: 1 % (ref 0–1)
Basophils Absolute: 0 10*3/uL (ref 0.0–0.1)
Eosinophils Absolute: 0.2 10*3/uL (ref 0.0–0.7)
Eosinophils Relative: 2 % (ref 0–5)
HCT: 40.1 % (ref 36.0–46.0)
HEMOGLOBIN: 12.9 g/dL (ref 12.0–15.0)
LYMPHS PCT: 34 % (ref 12–46)
Lymphs Abs: 2.5 10*3/uL (ref 0.7–4.0)
MCH: 28.7 pg (ref 26.0–34.0)
MCHC: 32.2 g/dL (ref 30.0–36.0)
MCV: 89.1 fL (ref 78.0–100.0)
Monocytes Absolute: 0.5 10*3/uL (ref 0.1–1.0)
Monocytes Relative: 7 % (ref 3–12)
NEUTROS PCT: 56 % (ref 43–77)
Neutro Abs: 4.1 10*3/uL (ref 1.7–7.7)
Platelets: 199 10*3/uL (ref 150–400)
RBC: 4.5 MIL/uL (ref 3.87–5.11)
RDW: 13.8 % (ref 11.5–15.5)
WBC: 7.3 10*3/uL (ref 4.0–10.5)

## 2014-07-03 LAB — URINALYSIS, ROUTINE W REFLEX MICROSCOPIC
Bilirubin Urine: NEGATIVE
Glucose, UA: >1000 mg/dL — AB
Hgb urine dipstick: NEGATIVE
Ketones, ur: NEGATIVE mg/dL
LEUKOCYTES UA: NEGATIVE
NITRITE: NEGATIVE
Protein, ur: NEGATIVE mg/dL
Specific Gravity, Urine: 1.04 — ABNORMAL HIGH (ref 1.005–1.030)
Urobilinogen, UA: 0.2 mg/dL (ref 0.0–1.0)
pH: 5.5 (ref 5.0–8.0)

## 2014-07-03 LAB — BASIC METABOLIC PANEL
ANION GAP: 10 (ref 5–15)
BUN: 12 mg/dL (ref 6–20)
CALCIUM: 8.8 mg/dL — AB (ref 8.9–10.3)
CO2: 24 mmol/L (ref 22–32)
Chloride: 101 mmol/L (ref 101–111)
Creatinine, Ser: 0.85 mg/dL (ref 0.44–1.00)
GFR calc Af Amer: >60 mL/min (ref >60)
Glucose, Bld: 358 mg/dL — ABNORMAL HIGH (ref 65–99)
Potassium: 4 mmol/L (ref 3.5–5.1)
SODIUM: 135 mmol/L (ref 135–145)

## 2014-07-03 LAB — PREGNANCY, URINE: Preg Test, Ur: NEGATIVE

## 2014-07-03 LAB — CBG MONITORING, ED
GLUCOSE-CAPILLARY: 320 mg/dL — AB (ref 65–99)
Glucose-Capillary: 286 mg/dL — ABNORMAL HIGH (ref 65–99)
Glucose-Capillary: 263 mg/dL — ABNORMAL HIGH (ref 65–99)

## 2014-07-03 LAB — URINE MICROSCOPIC-ADD ON: URINE-OTHER: NONE SEEN

## 2014-07-03 MED ORDER — ONDANSETRON HCL 4 MG/2ML IJ SOLN
4.0000 mg | Freq: Once | INTRAMUSCULAR | Status: AC
  Administered 2014-07-03: 4 mg via INTRAVENOUS
  Filled 2014-07-03: qty 2

## 2014-07-03 MED ORDER — SODIUM CHLORIDE 0.9 % IV BOLUS (SEPSIS)
1000.0000 mL | Freq: Once | INTRAVENOUS | Status: AC
  Administered 2014-07-03: 1000 mL via INTRAVENOUS

## 2014-07-03 MED ORDER — ONDANSETRON HCL 4 MG/2ML IJ SOLN
4.0000 mg | Freq: Once | INTRAMUSCULAR | Status: AC
  Administered 2014-07-03: 4 mg via INTRAVENOUS

## 2014-07-03 NOTE — ED Notes (Signed)
Awake. Verbally responsive. A/O x4. Resp even and unlabored. No audible adventitious breath sounds noted. ABC's intact.  

## 2014-07-03 NOTE — ED Notes (Addendum)
Pt states she began to feel "shaky" and sweaty around [place], CBG's running in 300's at home x 3 hours. Denies new pain, +nausea.

## 2014-07-03 NOTE — ED Notes (Signed)
cbg - [id number]

## 2014-07-03 NOTE — ED Notes (Signed)
Pt awake. Verbally responsive. Resp even and unlabored. No audible adventitious breath sounds noted. Pt reported nauseous without emesis. SR on monitor.  IV infusing NS at 958ml/hr without difficulty. Will make MD aware.

## 2014-07-03 NOTE — ED Provider Notes (Addendum)
CSN: [id number]     Arrival date & time 07/03/14  0320 History   First MD Initiated Contact with Patient 07/03/14 [phone]     Chief Complaint  Patient presents with  . Hyperglycemia     (Consider location/radiation/quality/duration/timing/severity/associated sxs/prior Treatment) HPI  This a 39 year old female with a history of diabetes, migraine, hypertension, hypercholesterolemia who presents with hyperglycemia. Patient reports that she felt shaky and sweaty at home. She checked her blood sugar multiple times and was greater than 300. Normally her blood sugars are in the 100. She denies any chest pain, shortness of breath, abdominal pain. She does endorse nausea. No recent illnesses. Denies any urinary symptoms. Has been taking her medications as prescribed.  Past Medical History  Diagnosis Date  . Diabetes mellitus   . Migraine   . Hypoactive thyroid   . Cancer     cervical  . Osteoporosis   . Hypertension   . High cholesterol   . Anginal pain 2011    "stress per Dr Pang"-occ at present from increased stress  . GERD (gastroesophageal reflux disease)   . Psoriasis     scattered throughout body- large amount- states increased with stress of surgery, followed by [name].   . Fatty liver     with gall bladder sludge by pt history/ no stones  . Mental disorder     bipolar  . Anxiety   . Ventral hernia 09/13/2011  . Depression   . Shortness of breath     with exertion/   ? adult onset asthma  . Bipolar disorder   . Family history of anesthesia complication     aggressiveness  . History of stress test 2013    non-specific angina, followed up with stress test, /w [facility], told results were WNL, no need for followup unless she was symptomatic  . Sleep apnea 01/2011     STOP BANG SCORE 6, Sleep study done at [facility]   . Stroke 2007    ? subacranoid bleed-  no defits  . Seizures     febrile seizures as a child  . Anemia     hx of anemia during  pregnancy , blood transfusion during hospitalization, for renal calculi /w sepsis   . Arthritis     ankles- recent steroid injections in both ankles, & R wrist   . Dysrhythmia     hx MVP; single episode of PAF in the setting of sepsis 06/2012 (resolved with Cardizem)  . PONV (postoperative nausea and vomiting)     also hard to get asleep per pt; difficult IV access  . Pneumonia     hx of   . Chronic kidney disease     stones in past, hosp. at [place], 6/14, became septic, PICC line discharged from hosp., for antibiotic Rx   Past Surgical History  Procedure Laterality Date  . Cesarean section  1997& 2011  . Appendectomy    . Lipoma      total 3  . Neuroplasty / transposition median nerve at carpal tunnel bilateral    . Cervical cancer      LEAP procedure  . Kidney stone surgery    . Ventral hernia repair  10/03/2011    Procedure: HERNIA REPAIR VENTRAL ADULT;  Surgeon: [name]. Tsuei, MD;  Location: [facility];  Service: General;  Laterality: N/A;  open ventral hernia repair with mesh  . Laparoscopy  10/22/2011    Procedure: LAPAROSCOPY DIAGNOSTIC;  Surgeon: [name]. Tsuei,  MD;  Location: [place];  Service: General;  Laterality: N/A;  . Hernia repair    . Eye surgery  10/2012    blepheroplasty- bilateral   . Dilation and curettage of uterus      several times   . Ventral hernia repair N/A 02/12/2013    Procedure: LAPAROSCOPIC VENTRAL HERNIA REPAIR ;  Surgeon: [name]. Georgette Dover, MD;  Location: [place];  Service: General;  Laterality: N/A;  . Insertion of mesh N/A 02/12/2013    Procedure: INSERTION OF MESH;  Surgeon: [name]. Georgette Dover, MD;  Location: [place];  Service: General;  Laterality: N/A;  . Laparoscopic lysis of adhesions N/A 02/12/2013    Procedure: LAPAROSCOPIC LYSIS OF ADHESIONS GREATER THAN TWO HOURS;  Surgeon: [name]. Georgette Dover, MD;  Location: [place];  Service: General;  Laterality: N/A;  . Dilation and evacuation N/A 10/23/2013    Procedure: DILATATION AND EVACUATION;  Surgeon: Woodroe Mode, MD;  Location: [facility];  Service: Gynecology;  Laterality: N/A;   Family History  Problem Relation Age of Onset  . Hypertension Mother   . Glaucoma Mother   . Hypertension Father   . Cancer Father     precancerious polyps  . Cancer Maternal Uncle     colon   History  Substance Use Topics  . Smoking status: Never Smoker   . Smokeless tobacco: Never Used  . Alcohol Use: No   OB History    Gravida Para Term Preterm AB TAB SAB Ectopic Multiple Living   8 3 3  0 4 0 4 0 0 3     Review of Systems  Constitutional: Positive for diaphoresis. Negative for fever.  Respiratory: Negative for chest tightness and shortness of breath.   Cardiovascular: Negative for chest pain.  Gastrointestinal: Positive for nausea. Negative for vomiting and abdominal pain.  Genitourinary: Negative for dysuria.  Musculoskeletal: Negative for back pain.  Neurological: Negative for headaches.  All other systems reviewed and are negative.     Allergies  Cephalexin; Latex; Lisinopril; Simvastatin; Statins; Augmentin; and Tea  Home Medications   Prior to Admission medications   Medication Sig Start Date End Date Taking? Authorizing Provider  acetaminophen (TYLENOL) 500 MG tablet Take 500 mg by mouth every 6 (six) hours as needed for moderate pain.    Yes Historical Provider, MD  glipiZIDE (GLUCOTROL XL) 5 MG 24 hr tablet Take 1 tablet (5 mg total) by mouth daily with breakfast. 04/05/14  Yes Lorayne Marek, MD  ibuprofen (ADVIL,MOTRIN) 200 MG tablet Take 800 mg by mouth every 6 (six) hours as needed for moderate pain.   Yes Historical Provider, MD  levothyroxine (SYNTHROID) 125 MCG tablet TAKE 1 TABLET BY MOUTH DAILY BEFORE BREAKFAST. 09/18/13  Yes Deepak Advani, MD  losartan (COZAAR) 25 MG tablet Take 1 tablet (25 mg total) by mouth daily. 04/05/14  Yes Deepak Advani, MD  metFORMIN (GLUCOPHAGE) 1000 MG tablet TAKE 1 TABLET BY MOUTH 2 TIMES DAILY WITH A MEAL. 06/09/14  Yes Deepak Advani, MD  Ustekinumab  (STELARA) 90 MG/ML SOSY Inject 90 mg into the skin.   Yes Historical Provider, MD  acetaminophen-codeine (TYLENOL #3) 300-30 MG per tablet Take 1-2 tablets by mouth every 4 (four) hours as needed for moderate pain. Patient not taking: Reported on 04/23/2014 10/23/13   Woodroe Mode, MD  acyclovir (ZOVIRAX) 200 MG capsule Take 2 capsules (400 mg total) by mouth 2 (two) times daily. Patient not taking: Reported on 04/23/2014 10/29/13   Woodroe Mode,  MD  antipyrine-benzocaine [facility]) otic solution Place 3-4 drops into the right ear every 2 (two) hours as needed for ear pain. Patient not taking: Reported on 04/23/2014 01/14/14   Waldemar Dickens, MD  azithromycin (ZITHROMAX) 250 MG tablet Take 1 tablet (250 mg total) by mouth daily. Take first 2 tablets together, then 1 every day until finished. Patient not taking: Reported on 04/23/2014 01/14/14   Waldemar Dickens, MD  ciprofloxacin-dexamethasone [facility]) otic suspension Place 4 drops into the right ear 2 (two) times daily. Patient not taking: Reported on 04/23/2014 01/14/14   Waldemar Dickens, MD  ciprofloxacin-dexamethasone [facility]) otic suspension Place 4 drops into the right ear 2 (two) times daily. Patient not taking: Reported on 04/23/2014 02/28/14   Liam Graham, PA-C  dextromethorphan (DELSYM) 30 MG/5ML liquid Take by mouth as needed for cough.    Historical Provider, MD  ferrous sulfate 325 (65 FE) MG EC tablet Take 325 mg by mouth daily.  02/19/12 02/18/13  Historical Provider, MD  fluconazole (DIFLUCAN) 150 MG tablet Take 1 tablet (150 mg total) by mouth daily. Repeat dose in 3 days Patient not taking: Reported on 04/23/2014 01/14/14   Waldemar Dickens, MD  GlucoCom Lancets MISC As directed TID and QHS 07/09/13   Tresa Garter, MD  glucose blood (CHOICE DM FORA G20 TEST STRIPS) test strip Use as instructed 07/09/13   Tresa Garter, MD  ibuprofen (ADVIL,MOTRIN) 800 MG tablet Take 1 tablet (800 mg total) by mouth 3 (three) times  daily. Patient not taking: Reported on 07/03/2014 02/28/14   Liam Graham, PA-C  ipratropium (ATROVENT) 0.06 % nasal spray Place 2 sprays into both nostrils 4 (four) times daily. Patient not taking: Reported on 04/23/2014 01/14/14   Waldemar Dickens, MD  Prenatal Vit-Fe Fumarate-FA (PRENATAL MULTIVITAMIN) TABS tablet Take 1 tablet by mouth daily at 12 noon.    Historical Provider, MD  promethazine (PHENERGAN) 25 MG tablet Take 1 tablet (25 mg total) by mouth every 6 (six) hours as needed for nausea or vomiting. Patient not taking: Reported on 07/03/2014 10/08/13   Olegario Messier, NP  sulfamethoxazole-trimethoprim (BACTRIM DS,SEPTRA DS) 800-160 MG per tablet Take 1 tablet by mouth 2 (two) times daily. 04/23/14   Micheline Chapman, NP  terazosin (HYTRIN) 1 MG capsule Take 1 capsule (1 mg total) by mouth at bedtime. Increase to 2 capsules by mouth daily at bedtime in 3 days as tolerated for kidney stone expulsion Patient not taking: Reported on 04/23/2014 02/28/14   Liam Graham, PA-C  Ustekinumab [facility] [place]) Inject into the skin.    Historical Provider, MD   BP 169/90 mmHg  Pulse 90  Temp(Src) 97.7 F (36.5 C) (Oral)  Resp 18  Ht 5\' 6"  (1.676 m)  Wt 322 lb (146.058 kg)  BMI 52.00 kg/m2  SpO2 97%  LMP 05/30/2013 Physical Exam  Constitutional: She is oriented to person, place, and time. No distress.   obese  HENT:  Head: Normocephalic and atraumatic.  Mouth/Throat: Oropharynx is clear and moist.  Eyes: Pupils are equal, round, and reactive to light.  Neck: Neck supple.  Cardiovascular: Normal rate, regular rhythm and normal heart sounds.   No murmur heard. Pulmonary/Chest: Effort normal and breath sounds normal. No respiratory distress. She has no wheezes.  Abdominal: Soft. Bowel sounds are normal. There is no tenderness. There is no rebound.  Musculoskeletal: She exhibits no edema.  Neurological: She is alert and oriented to person, place, and time.  Skin:  Moist and cool  Psychiatric:  She has a normal mood and affect.  Nursing note and vitals reviewed.   ED Course  Procedures (including critical care time) Labs Review Labs Reviewed  BASIC METABOLIC PANEL - Abnormal; Notable for the following:    Glucose, Bld 358 (*)    Calcium 8.8 (*)    All other components within normal limits  URINALYSIS, ROUTINE W REFLEX MICROSCOPIC (NOT AT [facility]) - Abnormal; Notable for the following:    Specific Gravity, Urine 1.040 (*)    Glucose, UA >1000 (*)    All other components within normal limits  CBG MONITORING, ED - Abnormal; Notable for the following:    Glucose-Capillary 320 (*)    All other components within normal limits  CBG MONITORING, ED - Abnormal; Notable for the following:    Glucose-Capillary 286 (*)    All other components within normal limits  CBG MONITORING, ED - Abnormal; Notable for the following:    Glucose-Capillary 263 (*)    All other components within normal limits  CBC WITH DIFFERENTIAL/PLATELET  PREGNANCY, URINE  URINE MICROSCOPIC-ADD ON  POC URINE PREG, ED    Imaging Review No results found.   EKG Interpretation   Date/Time:  Saturday July 03 2014 04:38:10 EDT Ventricular Rate:  93 PR Interval:  167 QRS Duration: 91 QT Interval:  373 QTC Calculation: 464 R Axis:   58 Text Interpretation:  Sinus rhythm Confirmed by Willisha Sligar  MD, Naydelin Ziegler  ([id number]) on 07/03/2014 7:11:34 AM      MDM   Final diagnoses:  Hyperglycemia    She presents with shaky feeling from home. States that her blood sugars have been elevated recently. She is usually controlled around 100. Initial vital signs notable for tachycardia to 103 and hypertension 172/100. Initial blood sugar is 158. She is otherwise nontoxic. Appears anxious. Only other symptom is nausea. Denies chest pain.  Workup is largely unremarkable. No evidence of anion gap or DKA. Patient was given 2 L of fluid. She does not take insulin at home. EKG is reassuring and urine pregnancy test is negative. During  patient's stay, she did endorse intermittent nausea.  She denies any abdominal pain. She improved with Zofran. Blood sugar improved to 263. I encouraged patient to Keep a log of her blood sugars at home over the next 24-48 hours. She should maintain a low glycemic diet. Follow-up with primary physician if what sugars remain persistently greater than 200. She was given strict return precautions.  After history, exam, and medical workup I feel the patient has been appropriately medically screened and is safe for discharge home. Pertinent diagnoses were discussed with the patient. Patient was given return precautions.     Merryl Hacker, MD 07/03/14 [id number]  Merryl Hacker, MD 07/14/14 [phone]

## 2014-07-03 NOTE — Discharge Instructions (Signed)
You were seen today for high blood sugar. Your workup is reassuring. You need follow-up with her primary care physician. Monitor blood sugars at home. You may need adjustments in her medications.  Hyperglycemia Hyperglycemia occurs when the glucose (sugar) in your blood is too high. Hyperglycemia can happen for many reasons, but it most often happens to people who do not know they have diabetes or are not managing their diabetes properly.  CAUSES  Whether you have diabetes or not, there are other causes of hyperglycemia. Hyperglycemia can occur when you have diabetes, but it can also occur in other situations that you might not be as aware of, such as: Diabetes  If you have diabetes and are having problems controlling your blood glucose, hyperglycemia could occur because of some of the following reasons:  Not following your meal plan.  Not taking your diabetes medications or not taking it properly.  Exercising less or doing less activity than you normally do.  Being sick. Pre-diabetes  This cannot be ignored. Before people develop Type 2 diabetes, they almost always have "pre-diabetes." This is when your blood glucose levels are higher than normal, but not yet high enough to be diagnosed as diabetes. Research has shown that some long-term damage to the body, especially the heart and circulatory system, may already be occurring during pre-diabetes. If you take action to manage your blood glucose when you have pre-diabetes, you may delay or prevent Type 2 diabetes from developing. Stress  If you have diabetes, you may be "diet" controlled or on oral medications or insulin to control your diabetes. However, you may find that your blood glucose is higher than usual in the hospital whether you have diabetes or not. This is often referred to as "stress hyperglycemia." Stress can elevate your blood glucose. This happens because of hormones put out by the body during times of stress. If stress has been  the cause of your high blood glucose, it can be followed regularly by your caregiver. That way he/she can make sure your hyperglycemia does not continue to get worse or progress to diabetes. Steroids  Steroids are medications that act on the infection fighting system (immune system) to block inflammation or infection. One side effect can be a rise in blood glucose. Most people can produce enough extra insulin to allow for this rise, but for those who cannot, steroids make blood glucose levels go even higher. It is not unusual for steroid treatments to "uncover" diabetes that is developing. It is not always possible to determine if the hyperglycemia will go away after the steroids are stopped. A special blood test called an A1c is sometimes done to determine if your blood glucose was elevated before the steroids were started. SYMPTOMS  Thirsty.  Frequent urination.  Dry mouth.  Blurred vision.  Tired or fatigue.  Weakness.  Sleepy.  Tingling in feet or leg. DIAGNOSIS  Diagnosis is made by monitoring blood glucose in one or all of the following ways:  A1c test. This is a chemical found in your blood.  Fingerstick blood glucose monitoring.  Laboratory results. TREATMENT  First, knowing the cause of the hyperglycemia is important before the hyperglycemia can be treated. Treatment may include, but is not be limited to:  Education.  Change or adjustment in medications.  Change or adjustment in meal plan.  Treatment for an illness, infection, etc.  More frequent blood glucose monitoring.  Change in exercise plan.  Decreasing or stopping steroids.  Lifestyle changes. HOME CARE INSTRUCTIONS  Test your blood glucose as directed.  Exercise regularly. Your caregiver will give you instructions about exercise. Pre-diabetes or diabetes which comes on with stress is helped by exercising.  Eat wholesome, balanced meals. Eat often and at regular, fixed times. Your caregiver or  nutritionist will give you a meal plan to guide your sugar intake.  Being at an ideal weight is important. If needed, losing as little as 10 to 15 pounds may help improve blood glucose levels. SEEK MEDICAL CARE IF:   You have questions about medicine, activity, or diet.  You continue to have symptoms (problems such as increased thirst, urination, or weight gain). SEEK IMMEDIATE MEDICAL CARE IF:   You are vomiting or have diarrhea.  Your breath smells fruity.  You are breathing faster or slower.  You are very sleepy or incoherent.  You have numbness, tingling, or pain in your feet or hands.  You have chest pain.  Your symptoms get worse even though you have been following your caregiver's orders.  If you have any other questions or concerns. Document Released: 07/04/2000 Document Revised: 04/02/2011 Document Reviewed: 05/07/2011 [facility] Patient Information 2015 [place], [place]. This information is not intended to replace advice given to you by your health care provider. Make sure you discuss any questions you have with your health care provider.

## 2014-07-19 ENCOUNTER — Other Ambulatory Visit: Payer: Self-pay

## 2014-08-05 ENCOUNTER — Encounter: Payer: Self-pay | Admitting: Gastroenterology

## 2014-08-06 ENCOUNTER — Other Ambulatory Visit: Payer: Self-pay | Admitting: Internal Medicine

## 2014-08-06 DIAGNOSIS — I10 Essential (primary) hypertension: Secondary | ICD-10-CM

## 2014-08-06 MED ORDER — LOSARTAN POTASSIUM 25 MG PO TABS: 25.0000 mg | ORAL_TABLET | Freq: Every day | ORAL | Status: DC

## 2014-08-11 ENCOUNTER — Other Ambulatory Visit: Payer: Self-pay | Admitting: Pharmacist

## 2014-08-11 ENCOUNTER — Encounter: Payer: Self-pay | Admitting: Gastroenterology

## 2014-08-11 ENCOUNTER — Ambulatory Visit: Payer: Self-pay | Attending: Internal Medicine | Admitting: Internal Medicine

## 2014-08-11 ENCOUNTER — Encounter: Payer: Self-pay | Admitting: Internal Medicine

## 2014-08-11 ENCOUNTER — Other Ambulatory Visit: Payer: Self-pay | Admitting: Internal Medicine

## 2014-08-11 VITALS — BP 128/86 | HR 74 | Temp 97.8°F | Resp 16 | Wt 319.2 lb

## 2014-08-11 DIAGNOSIS — Z860100 Personal history of colon polyps, unspecified: Secondary | ICD-10-CM

## 2014-08-11 DIAGNOSIS — Z800 Family history of malignant neoplasm of digestive organs: Secondary | ICD-10-CM

## 2014-08-11 DIAGNOSIS — E119 Type 2 diabetes mellitus without complications: Secondary | ICD-10-CM | POA: Insufficient documentation

## 2014-08-11 DIAGNOSIS — E139 Other specified diabetes mellitus without complications: Secondary | ICD-10-CM

## 2014-08-11 DIAGNOSIS — I10 Essential (primary) hypertension: Secondary | ICD-10-CM

## 2014-08-11 DIAGNOSIS — E039 Hypothyroidism, unspecified: Secondary | ICD-10-CM

## 2014-08-11 DIAGNOSIS — Z808 Family history of malignant neoplasm of other organs or systems: Secondary | ICD-10-CM | POA: Insufficient documentation

## 2014-08-11 LAB — GLUCOSE, POCT (MANUAL RESULT ENTRY): POC GLUCOSE: 296 mg/dL — AB (ref 70–99)

## 2014-08-11 LAB — POCT GLYCOSYLATED HEMOGLOBIN (HGB A1C): Hemoglobin A1C: 9.7

## 2014-08-11 MED ORDER — INSULIN PEN NEEDLE 32G X 4 MM MISC: Status: DC

## 2014-08-11 MED ORDER — LOSARTAN POTASSIUM 25 MG PO TABS: 25.0000 mg | ORAL_TABLET | Freq: Every day | ORAL | Status: DC

## 2014-08-11 MED ORDER — INSULIN GLARGINE 300 UNIT/ML ~~LOC~~ SOPN: 10.0000 [IU] | PEN_INJECTOR | Freq: Every day | SUBCUTANEOUS | Status: DC

## 2014-08-11 MED ORDER — LEVOTHYROXINE SODIUM 125 MCG PO TABS: ORAL_TABLET | ORAL | Status: DC

## 2014-08-11 MED ORDER — GLIPIZIDE ER 5 MG PO TB24
ORAL_TABLET | ORAL | Status: DC
Start: 2014-08-11 — End: 2014-10-28

## 2014-08-11 MED ORDER — INSULIN PEN NEEDLE 32G X 4 MM MISC: Status: AC

## 2014-08-11 MED ORDER — METFORMIN HCL 1000 MG PO TABS: ORAL_TABLET | ORAL | Status: DC

## 2014-08-11 NOTE — Patient Instructions (Signed)
Fat and Cholesterol Control Diet Fat and cholesterol levels in your blood and organs are influenced by your diet. High levels of fat and cholesterol may lead to diseases of the heart, small and large blood vessels, gallbladder, liver, and pancreas. CONTROLLING FAT AND CHOLESTEROL WITH DIET Although exercise and lifestyle factors are important, your diet is key. That is because certain foods are known to raise cholesterol and others to lower it. The goal is to balance foods for their effect on cholesterol and more importantly, to replace saturated and trans fat with other types of fat, such as monounsaturated fat, polyunsaturated fat, and omega-3 fatty acids. On average, a person should consume no more than 15 to 17 g of saturated fat daily. Saturated and trans fats are considered "bad" fats, and they will raise LDL cholesterol. Saturated fats are primarily found in animal products such as meats, butter, and cream. However, that does not mean you need to give up all your favorite foods. Today, there are good tasting, low-fat, low-cholesterol substitutes for most of the things you like to eat. Choose low-fat or nonfat alternatives. Choose round or loin cuts of red meat. These types of cuts are lowest in fat and cholesterol. Chicken (without the skin), fish, veal, and ground turkey breast are great choices. Eliminate fatty meats, such as hot dogs and salami. Even shellfish have little or no saturated fat. Have a 3 oz (85 g) portion when you eat lean meat, poultry, or fish. Trans fats are also called "partially hydrogenated oils." They are oils that have been scientifically manipulated so that they are solid at room temperature resulting in a longer shelf life and improved taste and texture of foods in which they are added. Trans fats are found in stick margarine, some tub margarines, cookies, crackers, and baked goods.  When baking and cooking, oils are a great substitute for butter. The monounsaturated oils are  especially beneficial since it is believed they lower LDL and raise HDL. The oils you should avoid entirely are saturated tropical oils, such as coconut and palm.  Remember to eat a lot from food groups that are naturally free of saturated and trans fat, including fish, fruit, vegetables, beans, grains (barley, rice, couscous, bulgur wheat), and pasta (without cream sauces).  IDENTIFYING FOODS THAT LOWER FAT AND CHOLESTEROL  Soluble fiber may lower your cholesterol. This type of fiber is found in fruits such as apples, vegetables such as broccoli, potatoes, and carrots, legumes such as beans, peas, and lentils, and grains such as barley. Foods fortified with plant sterols (phytosterol) may also lower cholesterol. You should eat at least 2 g per day of these foods for a cholesterol lowering effect.  Read package labels to identify low-saturated fats, trans fat free, and low-fat foods at the supermarket. Select cheeses that have only 2 to 3 g saturated fat per ounce. Use a heart-healthy tub margarine that is free of trans fats or partially hydrogenated oil. When buying baked goods (cookies, crackers), avoid partially hydrogenated oils. Breads and muffins should be made from whole grains (whole-wheat or whole oat flour, instead of "flour" or "enriched flour"). Buy non-creamy canned soups with reduced salt and no added fats.  FOOD PREPARATION TECHNIQUES  Never deep-fry. If you must fry, either stir-fry, which uses very little fat, or use non-stick cooking sprays. When possible, broil, bake, or roast meats, and steam vegetables. Instead of putting butter or margarine on vegetables, use lemon and herbs, applesauce, and cinnamon (for squash and sweet potatoes). Use nonfat   yogurt, salsa, and low-fat dressings for salads.  LOW-SATURATED FAT / LOW-FAT FOOD SUBSTITUTES Meats / Saturated Fat (g)  Avoid: Steak, marbled (3 oz/85 g) / 11 g  Choose: Steak, lean (3 oz/85 g) / 4 g  Avoid: Hamburger (3 oz/85 g) / 7  g  Choose: Hamburger, lean (3 oz/85 g) / 5 g  Avoid: Ham (3 oz/85 g) / 6 g  Choose: Ham, lean cut (3 oz/85 g) / 2.4 g  Avoid: Chicken, with skin, dark meat (3 oz/85 g) / 4 g  Choose: Chicken, skin removed, dark meat (3 oz/85 g) / 2 g  Avoid: Chicken, with skin, light meat (3 oz/85 g) / 2.5 g  Choose: Chicken, skin removed, light meat (3 oz/85 g) / 1 g Dairy / Saturated Fat (g)  Avoid: Whole milk (1 cup) / 5 g  Choose: Low-fat milk, 2% (1 cup) / 3 g  Choose: Low-fat milk, 1% (1 cup) / 1.5 g  Choose: Skim milk (1 cup) / 0.3 g  Avoid: Hard cheese (1 oz/28 g) / 6 g  Choose: Skim milk cheese (1 oz/28 g) / 2 to 3 g  Avoid: Cottage cheese, 4% fat (1 cup) / 6.5 g  Choose: Low-fat cottage cheese, 1% fat (1 cup) / 1.5 g  Avoid: Ice cream (1 cup) / 9 g  Choose: Sherbet (1 cup) / 2.5 g  Choose: Nonfat frozen yogurt (1 cup) / 0.3 g  Choose: Frozen fruit bar / trace  Avoid: Whipped cream (1 tbs) / 3.5 g  Choose: Nondairy whipped topping (1 tbs) / 1 g Condiments / Saturated Fat (g)  Avoid: Mayonnaise (1 tbs) / 2 g  Choose: Low-fat mayonnaise (1 tbs) / 1 g  Avoid: Butter (1 tbs) / 7 g  Choose: Extra light margarine (1 tbs) / 1 g  Avoid: Coconut oil (1 tbs) / 11.8 g  Choose: Olive oil (1 tbs) / 1.8 g  Choose: Corn oil (1 tbs) / 1.7 g  Choose: Safflower oil (1 tbs) / 1.2 g  Choose: Sunflower oil (1 tbs) / 1.4 g  Choose: Soybean oil (1 tbs) / 2.4 g  Choose: Canola oil (1 tbs) / 1 g Document Released: 01/08/2005 Document Revised: 05/05/2012 Document Reviewed: 04/08/2013 ExitCare Patient Information 2015 [place], [place]. This information is not intended to replace advice given to you by your health care provider. Make sure you discuss any questions you have with your health care provider. Diabetes Mellitus and Food It is important for you to manage your blood sugar (glucose) level. Your blood glucose level can be greatly affected by what you eat. Eating healthier  foods in the appropriate amounts throughout the day at about the same time each day will help you control your blood glucose level. It can also help slow or prevent worsening of your diabetes mellitus. Healthy eating may even help you improve the level of your blood pressure and reach or maintain a healthy weight.  HOW CAN FOOD AFFECT ME? Carbohydrates Carbohydrates affect your blood glucose level more than any other type of food. Your dietitian will help you determine how many carbohydrates to eat at each meal and teach you how to count carbohydrates. Counting carbohydrates is important to keep your blood glucose at a healthy level, especially if you are using insulin or taking certain medicines for diabetes mellitus. Alcohol Alcohol can cause sudden decreases in blood glucose (hypoglycemia), especially if you use insulin or take certain medicines for diabetes mellitus. Hypoglycemia can be a life-threatening  condition. Symptoms of hypoglycemia (sleepiness, dizziness, and disorientation) are similar to symptoms of having too much alcohol.  If your health care provider has given you approval to drink alcohol, do so in moderation and use the following guidelines:  Women should not have more than one drink per day, and men should not have more than two drinks per day. One drink is equal to:  12 oz of beer.  5 oz of wine.  1 oz of hard liquor.  Do not drink on an empty stomach.  Keep yourself hydrated. Have water, diet soda, or unsweetened iced tea.  Regular soda, juice, and other mixers might contain a lot of carbohydrates and should be counted. WHAT FOODS ARE NOT RECOMMENDED? As you make food choices, it is important to remember that all foods are not the same. Some foods have fewer nutrients per serving than other foods, even though they might have the same number of calories or carbohydrates. It is difficult to get your body what it needs when you eat foods with fewer nutrients. Examples of  foods that you should avoid that are high in calories and carbohydrates but low in nutrients include:  Trans fats (most processed foods list trans fats on the Nutrition Facts label).  Regular soda.  Juice.  Candy.  Sweets, such as cake, pie, doughnuts, and cookies.  Fried foods. WHAT FOODS CAN I EAT? Have nutrient-rich foods, which will nourish your body and keep you healthy. The food you should eat also will depend on several factors, including:  The calories you need.  The medicines you take.  Your weight.  Your blood glucose level.  Your blood pressure level.  Your cholesterol level. You also should eat a variety of foods, including:  Protein, such as meat, poultry, fish, tofu, nuts, and seeds (lean animal proteins are best).  Fruits.  Vegetables.  Dairy products, such as milk, cheese, and yogurt (low fat is best).  Breads, grains, pasta, cereal, rice, and beans.  Fats such as olive oil, trans fat-free margarine, canola oil, avocado, and olives. DOES EVERYONE WITH DIABETES MELLITUS HAVE THE SAME MEAL PLAN? Because every person with diabetes mellitus is different, there is not one meal plan that works for everyone. It is very important that you meet with a dietitian who will help you create a meal plan that is just right for you. Document Released: 10/05/2004 Document Revised: 01/13/2013 Document Reviewed: 12/05/2012 Exit[employer] Patient Information 2015 [employer]. This information is not intended to replace advice given to you by your health care provider. Make sure you discuss any questions you have with your health care provider.  

## 2014-08-11 NOTE — Progress Notes (Signed)
Patient here for follow up on her diabetes and in need of medication refills Patient also requesting a referral to GI for her colonoscopy

## 2014-08-11 NOTE — Progress Notes (Signed)
MRN: [id number] Name: [name]  Sex: female Age: 39 y.o. DOB: July 14, 1975  Allergies: Cephalexin; Latex; Lisinopril; Simvastatin; Statins; Augmentin; and Tea  Chief Complaint  Patient presents with  . Follow-up    HPI: Patient is 39 y.o. female who has history of diabetes, psoriasis, hypothyroidism, comes today for followup as per patient she is compliant in taking her medications, has not been to checking her blood sugar at home, denies any hypoglycemic symptoms, today her hemoglobin A1c noticed to have trended up, she is following up with her dermatologist and reports improvement in psoriasis, previous blood work reviewed with the patient.patient also reports family history of colon cancer, as per patient she had colonoscopy done 5 years ago and was told she had precancerous polyp and was recommended to repeat the colonoscopy in 5 years.  Past Medical History  Diagnosis Date  . Diabetes mellitus   . Migraine   . Hypoactive thyroid   . Cancer     cervical  . Osteoporosis   . Hypertension   . High cholesterol   . Anginal pain 2011    "stress per Dr Pang"-occ at present from increased stress  . GERD (gastroesophageal reflux disease)   . Psoriasis     scattered throughout body- large amount- states increased with stress of surgery, followed by [name].   . Fatty liver     with gall bladder sludge by pt history/ no stones  . Mental disorder     bipolar  . Anxiety   . Ventral hernia 09/13/2011  . Depression   . Shortness of breath     with exertion/   ? adult onset asthma  . Bipolar disorder   . Family history of anesthesia complication     aggressiveness  . History of stress test 2013    non-specific angina, followed up with stress test, /w [facility], told results were WNL, no need for followup unless she was symptomatic  . Sleep apnea 01/2011     STOP BANG SCORE 6, Sleep study done at [facility]   . Stroke 2007    ? subacranoid bleed-   no defits  . Seizures     febrile seizures as a child  . Anemia     hx of anemia during pregnancy , blood transfusion during hospitalization, for renal calculi /w sepsis   . Arthritis     ankles- recent steroid injections in both ankles, & R wrist   . Dysrhythmia     hx MVP; single episode of PAF in the setting of sepsis 06/2012 (resolved with Cardizem)  . PONV (postoperative nausea and vomiting)     also hard to get asleep per pt; difficult IV access  . Pneumonia     hx of   . Chronic kidney disease     stones in past, hosp. at [facility], 6/14, became septic, PICC line discharged from hosp., for antibiotic Rx    Past Surgical History  Procedure Laterality Date  . Cesarean section  1997& 2011  . Appendectomy    . Lipoma      total 3  . Neuroplasty / transposition median nerve at carpal tunnel bilateral    . Cervical cancer      LEAP procedure  . Kidney stone surgery    . Ventral hernia repair  10/03/2011    Procedure: HERNIA REPAIR VENTRAL ADULT;  Surgeon: [name]. Georgette Dover, MD;  Location: [facility];  Service: General;  Laterality: N/A;  open ventral hernia repair with mesh  . Laparoscopy  10/22/2011    Procedure: LAPAROSCOPY DIAGNOSTIC;  Surgeon: [name]. Georgette Dover, MD;  Location: [facility];  Service: General;  Laterality: N/A;  . Hernia repair    . Eye surgery  10/2012    blepheroplasty- bilateral   . Dilation and curettage of uterus      several times   . Ventral hernia repair N/A 02/12/2013    Procedure: LAPAROSCOPIC VENTRAL HERNIA REPAIR ;  Surgeon: [name]. Georgette Dover, MD;  Location: [facility];  Service: General;  Laterality: N/A;  . Insertion of mesh N/A 02/12/2013    Procedure: INSERTION OF MESH;  Surgeon: [name]. Georgette Dover, MD;  Location: [facility];  Service: General;  Laterality: N/A;  . Laparoscopic lysis of adhesions N/A 02/12/2013    Procedure: LAPAROSCOPIC LYSIS OF ADHESIONS GREATER THAN TWO HOURS;  Surgeon: [name]. Georgette Dover, MD;  Location: [facility];  Service: General;  Laterality: N/A;  .  Dilation and evacuation N/A 10/23/2013    Procedure: DILATATION AND EVACUATION;  Surgeon: Woodroe Mode, MD;  Location: [facility];  Service: Gynecology;  Laterality: N/A;      Medication List       This list is accurate as of: 08/11/14 11:32 AM.  Always use your most recent med list.               acetaminophen 500 MG tablet  Commonly known as:  TYLENOL  Take 500 mg by mouth every 6 (six) hours as needed for moderate pain.     acetaminophen-codeine 300-30 MG per tablet  Commonly known as:  TYLENOL #3  Take 1-2 tablets by mouth every 4 (four) hours as needed for moderate pain.     acyclovir 200 MG capsule  Commonly known as:  ZOVIRAX  Take 2 capsules (400 mg total) by mouth 2 (two) times daily.     antipyrine-benzocaine otic solution  Commonly known as:  AURALGAN  Place 3-4 drops into the right ear every 2 (two) hours as needed for ear pain.     azithromycin 250 MG tablet  Commonly known as:  ZITHROMAX  Take 1 tablet (250 mg total) by mouth daily. Take first 2 tablets together, then 1 every day until finished.     ciprofloxacin-dexamethasone otic suspension  Commonly known as:  CIPRODEX  Place 4 drops into the right ear 2 (two) times daily.     ciprofloxacin-dexamethasone otic suspension  Commonly known as:  CIPRODEX  Place 4 drops into the right ear 2 (two) times daily.     dextromethorphan 30 MG/5ML liquid  Commonly known as:  DELSYM  Take by mouth as needed for cough.     ferrous sulfate 325 (65 FE) MG EC tablet  Take 325 mg by mouth daily.     fluconazole 150 MG tablet  Commonly known as:  DIFLUCAN  Take 1 tablet (150 mg total) by mouth daily. Repeat dose in 3 days     glipiZIDE 5 MG 24 hr tablet  Commonly known as:  GLIPIZIDE XL  TAKE 1 TABLET BY MOUTH DAILY WITH BREAKFAST.     GlucoCom Lancets Misc  As directed TID and QHS     glucose blood test strip  Commonly known as:  CHOICE DM FORA G20 TEST STRIPS  Use as instructed     ibuprofen 200 MG tablet    Commonly known as:  ADVIL,MOTRIN  Take 800 mg by mouth every 6 (six) hours as needed for moderate pain.  ibuprofen 800 MG tablet  Commonly known as:  ADVIL,MOTRIN  Take 1 tablet (800 mg total) by mouth 3 (three) times daily.     Insulin Glargine 300 UNIT/ML Sopn  Commonly known as:  TOUJEO SOLOSTAR  Inject 10 Units into the skin at bedtime.     Insulin Pen Needle 32G X 4 MM Misc  Check 4 x a day     ipratropium 0.06 % nasal spray  Commonly known as:  ATROVENT  Place 2 sprays into both nostrils 4 (four) times daily.     levothyroxine 125 MCG tablet  Commonly known as:  SYNTHROID  TAKE 1 TABLET BY MOUTH DAILY BEFORE BREAKFAST.     losartan 25 MG tablet  Commonly known as:  COZAAR  Take 1 tablet (25 mg total) by mouth daily.     metFORMIN 1000 MG tablet  Commonly known as:  GLUCOPHAGE  TAKE 1 TABLET BY MOUTH 2 TIMES DAILY WITH A MEAL.     prenatal multivitamin Tabs tablet  Take 1 tablet by mouth daily at 12 noon.     promethazine 25 MG tablet  Commonly known as:  PHENERGAN  Take 1 tablet (25 mg total) by mouth every 6 (six) hours as needed for nausea or vomiting.     STELARA [place]  Inject into the skin.     STELARA 90 MG/ML Sosy  Generic drug:  Ustekinumab  Inject 90 mg into the skin.     sulfamethoxazole-trimethoprim 800-160 MG per tablet  Commonly known as:  BACTRIM DS,SEPTRA DS  Take 1 tablet by mouth 2 (two) times daily.     terazosin 1 MG capsule  Commonly known as:  HYTRIN  Take 1 capsule (1 mg total) by mouth at bedtime. Increase to 2 capsules by mouth daily at bedtime in 3 days as tolerated for kidney stone expulsion        Meds ordered this encounter  Medications  . glipiZIDE (GLIPIZIDE XL) 5 MG 24 hr tablet    Sig: TAKE 1 TABLET BY MOUTH DAILY WITH BREAKFAST.    Dispense:  30 tablet    Refill:  3  . levothyroxine (SYNTHROID) 125 MCG tablet    Sig: TAKE 1 TABLET BY MOUTH DAILY BEFORE BREAKFAST.    Dispense:  90 tablet    Refill:  3  . metFORMIN  (GLUCOPHAGE) 1000 MG tablet    Sig: TAKE 1 TABLET BY MOUTH 2 TIMES DAILY WITH A MEAL.    Dispense:  60 tablet    Refill:  2  . losartan (COZAAR) 25 MG tablet    Sig: Take 1 tablet (25 mg total) by mouth daily.    Dispense:  30 tablet    Refill:  3  . Insulin Glargine (TOUJEO SOLOSTAR) 300 UNIT/ML SOPN    Sig: Inject 10 Units into the skin at bedtime.    Dispense:  1.5 mL    Refill:  3  . Insulin Pen Needle 32G X 4 MM MISC    Sig: Check 4 x a day    Dispense:  200 each    Refill:  5    Immunization History  Administered Date(s) Administered  . Influenza,inj,Quad PF,36+ Mos 04/05/2014  . Pneumococcal Polysaccharide-23 10/05/2011    Family History  Problem Relation Age of Onset  . Hypertension Mother   . Glaucoma Mother   . Hypertension Father   . Cancer Father     precancerious polyps  . Cancer Maternal Uncle     colon  History  Substance Use Topics  . Smoking status: Never Smoker   . Smokeless tobacco: Never Used  . Alcohol Use: No    Review of Systems   As noted in HPI  Filed Vitals:   08/11/14 1037  BP: 128/86  Pulse: 74  Temp: 97.8 F (36.6 C)  Resp: 16    Physical Exam  Physical Exam  Constitutional: No distress.  Eyes: EOM are normal. Pupils are equal, round, and reactive to light.  Cardiovascular: Normal rate and regular rhythm.   Pulmonary/Chest: Breath sounds normal. No respiratory distress. She has no wheezes. She has no rales.  Musculoskeletal: She exhibits no edema.    CBC    Component Value Date/Time   WBC 7.3 07/03/2014 0418   RBC 4.50 07/03/2014 0418   HGB 12.9 07/03/2014 0418   HCT 40.1 07/03/2014 0418   PLT 199 07/03/2014 0418   MCV 89.1 07/03/2014 0418   LYMPHSABS 2.5 07/03/2014 0418   MONOABS 0.5 07/03/2014 0418   EOSABS 0.2 07/03/2014 0418   BASOSABS 0.0 07/03/2014 0418    CMP     Component Value Date/Time   NA 135 07/03/2014 0418   K 4.0 07/03/2014 0418   CL 101 07/03/2014 0418   CO2 24 07/03/2014 0418    GLUCOSE 358* 07/03/2014 0418   BUN 12 07/03/2014 0418   CREATININE 0.85 07/03/2014 0418   CREATININE 0.87 04/05/2014 1715   CALCIUM 8.8* 07/03/2014 0418   PROT 6.8 04/05/2014 1715   ALBUMIN 4.1 04/05/2014 1715   AST 27 04/05/2014 1715   ALT 29 04/05/2014 1715   ALKPHOS 82 04/05/2014 1715   BILITOT 0.6 04/05/2014 1715   GFRNONAA >60 07/03/2014 0418   GFRNONAA 84 04/05/2014 1715   GFRAA >60 07/03/2014 0418   GFRAA >89 04/05/2014 1715    Lab Results  Component Value Date/Time   CHOL 117 06/09/2013 12:40 PM    Lab Results  Component Value Date/Time   HGBA1C 9.70 08/11/2014 10:32 AM   HGBA1C 6.6* 02/12/2013 06:55 PM    Lab Results  Component Value Date/Time   AST 27 04/05/2014 05:15 PM    Assessment and Plan  Other specified diabetes mellitus without complications - Plan:  Results for orders placed or performed in visit on 08/11/14  Glucose (CBG)  Result Value Ref Range   POC Glucose 296.0 (A) 70 - 99 mg/dl  HgB A1c  Result Value Ref Range   Hemoglobin A1C 9.70    Hemoglobin A1c has trended up, patient will continue with metformin and glipizide, I have added long-acting insulin 10 units to take at night, keep the fingerstick log, advise patient for diabetes meal planning, repeat A1c in 3 months  HgB A1c, glipiZIDE (GLIPIZIDE XL) 5 MG 24 hr tablet, metFORMIN (GLUCOPHAGE) 1000 MG tablet, Insulin Glargine (TOUJEO SOLOSTAR) 300 UNIT/ML SOPN, Insulin Pen Needle 32G X 4 MM MISC  Hypothyroidism, unspecified hypothyroidism type - Plan: last TSH level was normal, continue levothyroxine (SYNTHROID) 125 MCG tablet, will recheck TSH level on the following visit.  Essential hypertension - Plan:pressure is controlled, continue with DASH diet, losartan (COZAAR) 25 MG tablet  Family history of colon cancer - Plan: Ambulatory referral to Gastroenterology  History of colonic polyps - Plan: Ambulatory referral to Gastroenterology    Return in about 3 months (around 11/11/2014), or if  symptoms worsen or fail to improve.   This note has been created with Surveyor, quantity. Any transcriptional errors are unintentional.  Lorayne Marek, MD

## 2014-09-10 ENCOUNTER — Telehealth: Payer: Self-pay | Admitting: Internal Medicine

## 2014-09-10 NOTE — Telephone Encounter (Signed)
[employer] admission form. Pt. needs physical and form done for school. Also needs records of all shots and immunizations as stated on form. Form placed in jegedes box. University gave her 30 days to return form. Patient and or patients husband will pick up form when ready.

## 2014-09-28 NOTE — Telephone Encounter (Signed)
Patient called to check on status of paperwork, please f/u

## 2014-09-29 NOTE — Telephone Encounter (Signed)
Patient was called to inform form was ready to be picked up. Form was later picked up by a female family member.

## 2014-10-13 ENCOUNTER — Telehealth: Payer: Self-pay

## 2014-10-13 NOTE — Telephone Encounter (Signed)
Returned patient phone call Patient not available Left message on voice mail to return our call 

## 2014-10-14 NOTE — Telephone Encounter (Signed)
Patient called back, she requested to get a call back after two when she gets out of class. Please f/u

## 2014-10-15 ENCOUNTER — Encounter: Payer: Self-pay | Admitting: Gastroenterology

## 2014-10-15 ENCOUNTER — Ambulatory Visit (INDEPENDENT_AMBULATORY_CARE_PROVIDER_SITE_OTHER): Payer: Self-pay | Admitting: Gastroenterology

## 2014-10-15 VITALS — BP 118/74 | HR 80 | Ht 66.0 in | Wt 321.1 lb

## 2014-10-15 DIAGNOSIS — R112 Nausea with vomiting, unspecified: Secondary | ICD-10-CM

## 2014-10-15 DIAGNOSIS — Z86010 Personal history of colonic polyps: Secondary | ICD-10-CM

## 2014-10-15 MED ORDER — MOVIPREP 100 G PO SOLR
1.0000 | Freq: Once | ORAL | Status: DC
Start: 2014-10-15 — End: 2014-12-31

## 2014-10-15 NOTE — Patient Instructions (Addendum)
You will be set up for a colonoscopy with "double prep" at [facility] with [facility].  Colonoscopy for history of colon polyps. Gastric emptying scan for nausea, vomiting. If the gastric emptying scan is positive, then likely to recommend reglan 10mg  pill at bedtime nightly.  You have been scheduled for a gastric emptying scan at [facility] on 11-05-14 at 7:30am. Please arrive at least 15 minutes prior to your appointment for registration. Please make certain not to have anything to eat or drink after midnight the night before your test. Hold all stomach medications (ex: Zofran, phenergan, Reglan) 48 hours prior to your test. If you need to reschedule your appointment, please contact radiology scheduling at [phone]. _____________________________________________________________________ A gastric-emptying study measures how long it takes for food to move through your stomach. There are several ways to measure stomach emptying. In the most common test, you eat food that contains a small amount of radioactive material. A scanner that detects the movement of the radioactive material is placed over your abdomen to monitor the rate at which food leaves your stomach. This test normally takes about 2 hours to complete. _____________________________________________________________________

## 2014-10-15 NOTE — Progress Notes (Signed)
HPI: This is a  very pleasant 39 year old woman whom I am meeting for the first time today  Chief complaint is history of colon polyps, intermittent nausea vomiting  She was sent a recall letter recently.   Several uncles have had colon cancer.  Her father has had numerous colon polyps.  She had a colonoscopy with Dr. Verl Blalock 2011. See all of that below.  She has chronic nausea, belching.  She had gastric emptying scan done 2011. It was a 2 hour scan, it was technically normal however she was taking Reglan at the time and so it may have been falsely normal. Recent hemoglobin A1c was 9.7. She describes vomiting food in the morning that was from the day before on a pre-frequent basis.     Colonoscopy Dr. Verl Blalock  07/2009; indication : unexplained diarrhea, hematochezia, Routine Risk  Screening FINDINGS: There were multiple polyps identified and removed. FLAT  5-7 MM POLYPS HOT AND COLD SNARE EXCISED.VERY,VERY POOR PREP.  This was otherwise a normal examination of the colon. Retroflexed  views in the rectum revealed no abnormalities. The scope was  then withdrawn from the patient and the procedure completed.    COMPLICATIONS: None  ENDOSCOPIC IMPRESSION:  1) Polyps, multiple  2) Otherwise normal examination  R/O ADENOMAS.  RECOMMENDATIONS:  REPEAT EXAM 1 YEAR WITH DOUBLE BOWEL PREP.  REPEAT EXAM: No  Pathology 1. COLON, POLYP(S), TRANSVERSE : - TUBULAR ADENOMA. - high grade dysplasia is not identified.  Copy of letter sent to her by Dr. Sharlett Iles 07/2009: "I recommend you have a repeat colonoscopy examination in 5_ years to look for recurrent polyps, as having colon polyps increases your risk for having recurrent polyps or even colon cancer in the future."     Review of systems: Pertinent positive and negative review of systems were noted in the above HPI section. Complete review of systems was performed and  was otherwise normal.   Past Medical History  Diagnosis Date  . Diabetes mellitus   . Migraine   . Hypoactive thyroid   . Cancer     cervical  . Osteoporosis   . Hypertension   . High cholesterol   . Anginal pain 2011    "stress per Dr Pang"-occ at present from increased stress  . GERD (gastroesophageal reflux disease)   . Psoriasis     scattered throughout body- large amount- states increased with stress of surgery, followed by [name].   . Fatty liver     with gall bladder sludge by pt history/ no stones  . Mental disorder     bipolar  . Anxiety   . Ventral hernia 09/13/2011  . Depression   . Shortness of breath     with exertion/   ? adult onset asthma  . Bipolar disorder   . Family history of anesthesia complication     aggressiveness  . History of stress test 2013    non-specific angina, followed up with stress test, /w [facility], told results were WNL, no need for followup unless she was symptomatic  . Sleep apnea 01/2011     STOP BANG SCORE 6, Sleep study done at [facility] [facility]   . Stroke 2007    ? subacranoid bleed-  no defits  . Seizures     febrile seizures as a child  . Anemia     hx of anemia during pregnancy , blood transfusion during hospitalization, for renal calculi /w sepsis   . Arthritis  ankles- recent steroid injections in both ankles, & R wrist   . Dysrhythmia     hx MVP; single episode of PAF in the setting of sepsis 06/2012 (resolved with Cardizem)  . PONV (postoperative nausea and vomiting)     also hard to get asleep per pt; difficult IV access  . Pneumonia     hx of   . Chronic kidney disease     stones in past, hosp. at [place], 6/14, became septic, PICC line discharged from [facility] for antibiotic Rx  . Diabetes type 2, controlled   . Arrhythmia   . Arthritis   . Morbid obesity with BMI of 50.0-59.9, adult     Past Surgical History  Procedure Laterality Date  . Cesarean section  1997& 2011  . Appendectomy     . Lipoma      total 3  . Neuroplasty / transposition median nerve at carpal tunnel bilateral    . Cervical cancer      LEAP procedure  . Kidney stone surgery    . Ventral hernia repair  10/03/2011    Procedure: HERNIA REPAIR VENTRAL ADULT;  Surgeon: [name]. Tsuei, MD;  Location: [facility];  Service: General;  Laterality: N/A;  open ventral hernia repair with mesh  . Laparoscopy  10/22/2011    Procedure: LAPAROSCOPY DIAGNOSTIC;  Surgeon: [name]. Georgette Dover, MD;  Location: [facility];  Service: General;  Laterality: N/A;  . Hernia repair    . Eye surgery  10/2012    blepheroplasty- bilateral   . Dilation and curettage of uterus      several times   . Ventral hernia repair N/A 02/12/2013    Procedure: LAPAROSCOPIC VENTRAL HERNIA REPAIR ;  Surgeon: [name]. Georgette Dover, MD;  Location: [place];  Service: General;  Laterality: N/A;  . Insertion of mesh N/A 02/12/2013    Procedure: INSERTION OF MESH;  Surgeon: [name]. Georgette Dover, MD;  Location: [place];  Service: General;  Laterality: N/A;  . Laparoscopic lysis of adhesions N/A 02/12/2013    Procedure: LAPAROSCOPIC LYSIS OF ADHESIONS GREATER THAN TWO HOURS;  Surgeon: [name]. Georgette Dover, MD;  Location: [place];  Service: General;  Laterality: N/A;  . Dilation and evacuation N/A 10/23/2013    Procedure: DILATATION AND EVACUATION;  Surgeon: Woodroe Mode, MD;  Location: [facility];  Service: Gynecology;  Laterality: N/A;    Current Outpatient Prescriptions  Medication Sig Dispense Refill  . acetaminophen (TYLENOL) 500 MG tablet Take 500 mg by mouth every 6 (six) hours as needed for moderate pain.     Marland Kitchen glipiZIDE (GLIPIZIDE XL) 5 MG 24 hr tablet TAKE 1 TABLET BY MOUTH DAILY WITH BREAKFAST. 30 tablet 3  . GlucoCom Lancets MISC As directed TID and QHS 100 each 2  . ibuprofen (ADVIL,MOTRIN) 800 MG tablet Take 1 tablet (800 mg total) by mouth 3 (three) times daily. 60 tablet 0  . Insulin Glargine (TOUJEO SOLOSTAR) 300 UNIT/ML SOPN Inject 10 Units into the skin at bedtime. 1.5  mL 3  . Insulin Pen Needle 32G X 4 MM MISC Use as directed 200 each 5  . levothyroxine (SYNTHROID) 125 MCG tablet TAKE 1 TABLET BY MOUTH DAILY BEFORE BREAKFAST. 90 tablet 3  . losartan (COZAAR) 25 MG tablet Take 1 tablet (25 mg total) by mouth daily. 30 tablet 3  . metFORMIN (GLUCOPHAGE) 1000 MG tablet TAKE 1 TABLET BY MOUTH 2 TIMES DAILY WITH A MEAL. 60 tablet 2  . TRUETEST TEST test strip USE AS INSTRUCTED  100 each 12  . Ustekinumab (STELARA) 90 MG/ML SOSY Inject 90 mg into the skin.     No current facility-administered medications for this visit.   Facility-Administered Medications Ordered in Other Visits  Medication Dose Route Frequency Escarlet Saathoff Last Rate Last Dose  . midazolam (VERSED) 5 MG/5ML injection    PRN Manus Rudd [place] CRNA   2 mg at 08/06/11 0705    Allergies as of 10/15/2014 - Review Complete 08/11/2014  Allergen Reaction Noted  . Cephalexin Anaphylaxis and Shortness Of Breath   . Latex Itching 07/24/2011  . Lisinopril Cough and Other (See Comments) 07/24/2011  . Statins Other (See Comments) and Nausea And Vomiting 09/18/2011  . Augmentin [amoxicillin-pot clavulanate] Rash and Swelling 10/24/2011  . Tea Rash 07/24/2011    Family History  Problem Relation Age of Onset  . Hypertension Mother   . Glaucoma Mother   . Hypertension Father   . Cancer Father     precancerious polyps  . Cancer Maternal Uncle     colon    Social History   Social History  . Marital Status: Married    Spouse Name: N/A  . Number of Children: 3  . Years of Education: N/A   Occupational History  . Student     Social History Main Topics  . Smoking status: Never Smoker   . Smokeless tobacco: Never Used  . Alcohol Use: 0.0 oz/week    0 Standard drinks or equivalent per week  . Drug Use: No  . Sexual Activity: Yes   Other Topics Concern  . Not on file   Social History Narrative     Physical Exam: BP 118/74 mmHg  Pulse 80  Ht 5\' 6"  (1.676 m)  Wt 321 lb 2 oz (145.661  kg)  BMI 51.86 kg/m2 Constitutional: generally well-appearing Psychiatric: alert and oriented x3 Eyes: extraocular movements intact Mouth: oral pharynx moist, no lesions Neck: supple no lymphadenopathy Cardiovascular: heart regular rate and rhythm Lungs: clear to auscultation bilaterally Abdomen: soft, nontender, nondistended, no obvious ascites, no peritoneal signs, normal bowel sounds Extremities: no lower extremity edema bilaterally Skin: no lesions on visible extremities   Assessment and plan: 39 y.o. female with  personal history of colon polyps, intermittent nausea vomiting  I recommended that we proceed with colonoscopy at her soonest convenience and that she undergo a "double prep to ensure best visualization. Given her obesity, BMI greater than 50 this will have to be done at the hospital per protocol. She has poorly controlled diabetes with hemoglobin A1c of 9.7. Symptoms consistent with gastroparesis. Going to order a gastroparesis, gastric emptying scan test to try to document this more completely and if positive I will likely recommend that she start Reglan on a nightly basis at bedtime.   Owens Loffler, MD [facility] 10/15/2014, 3:09 PM  Cc: Lorayne Marek, MD

## 2014-10-19 ENCOUNTER — Ambulatory Visit: Payer: Self-pay | Attending: Internal Medicine | Admitting: Pharmacist

## 2014-10-19 ENCOUNTER — Telehealth: Payer: Self-pay | Admitting: Internal Medicine

## 2014-10-19 DIAGNOSIS — Z0000 Encounter for general adult medical examination without abnormal findings: Secondary | ICD-10-CM

## 2014-10-19 DIAGNOSIS — Z23 Encounter for immunization: Secondary | ICD-10-CM

## 2014-10-19 MED ORDER — TETANUS-DIPHTH-ACELL PERTUSSIS 5-2.5-18.5 LF-MCG/0.5 IM SUSP
0.5000 mL | Freq: Once | INTRAMUSCULAR | Status: AC
  Administered 2014-10-19: 0.5 mL via INTRAMUSCULAR

## 2014-10-19 NOTE — Telephone Encounter (Signed)
Medical Assistant left message on patient's cell voicemail. Voicemail states to give a call back to Nubia with [facility] at [phone].  

## 2014-10-19 NOTE — Patient Instructions (Signed)

## 2014-10-19 NOTE — Telephone Encounter (Signed)
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 Patient is hoping to pick this letter up on her appt for her tb reading on the 29th but if this is not possible she will return on the 6th of October to establish care with her PCP. Thank you.

## 2014-10-21 ENCOUNTER — Ambulatory Visit: Payer: Self-pay | Attending: Internal Medicine

## 2014-10-21 VITALS — BP 138/79 | HR 100 | Temp 98.1°F | Resp 18 | Ht 66.0 in | Wt 320.0 lb

## 2014-10-21 DIAGNOSIS — Z0000 Encounter for general adult medical examination without abnormal findings: Secondary | ICD-10-CM

## 2014-10-21 DIAGNOSIS — Z23 Encounter for immunization: Secondary | ICD-10-CM | POA: Insufficient documentation

## 2014-10-21 LAB — TB SKIN TEST
Induration: 0 mm
TB SKIN TEST: NEGATIVE

## 2014-10-21 NOTE — Progress Notes (Signed)
Patient here for TB test read and flu vaccine. Patient reports feeling good, no fever or sickness recently. Patient has latex as allergy but patient reports it is only in the vaginal area with condoms. Patient takes Stelara for psoriasis.   Tb is negative.   Per Dr. Adrian Blackwater, give patient flu vaccine and initiate Hep B series. Dr. Adrian Blackwater aware of pulse, latex allergy and patient taking stelara.

## 2014-10-28 ENCOUNTER — Encounter: Payer: Self-pay | Admitting: Internal Medicine

## 2014-10-28 ENCOUNTER — Ambulatory Visit: Payer: Medicaid Other | Attending: Internal Medicine | Admitting: Internal Medicine

## 2014-10-28 VITALS — BP 128/74 | HR 92 | Temp 97.9°F | Resp 20 | Ht 66.0 in | Wt 318.8 lb

## 2014-10-28 DIAGNOSIS — Z6841 Body Mass Index (BMI) 40.0 and over, adult: Secondary | ICD-10-CM | POA: Insufficient documentation

## 2014-10-28 DIAGNOSIS — E039 Hypothyroidism, unspecified: Secondary | ICD-10-CM

## 2014-10-28 DIAGNOSIS — K760 Fatty (change of) liver, not elsewhere classified: Secondary | ICD-10-CM | POA: Insufficient documentation

## 2014-10-28 DIAGNOSIS — E1169 Type 2 diabetes mellitus with other specified complication: Secondary | ICD-10-CM

## 2014-10-28 DIAGNOSIS — I10 Essential (primary) hypertension: Secondary | ICD-10-CM | POA: Insufficient documentation

## 2014-10-28 DIAGNOSIS — G4730 Sleep apnea, unspecified: Secondary | ICD-10-CM | POA: Insufficient documentation

## 2014-10-28 DIAGNOSIS — E118 Type 2 diabetes mellitus with unspecified complications: Secondary | ICD-10-CM | POA: Insufficient documentation

## 2014-10-28 DIAGNOSIS — K029 Dental caries, unspecified: Secondary | ICD-10-CM | POA: Insufficient documentation

## 2014-10-28 DIAGNOSIS — Z8673 Personal history of transient ischemic attack (TIA), and cerebral infarction without residual deficits: Secondary | ICD-10-CM | POA: Insufficient documentation

## 2014-10-28 DIAGNOSIS — E139 Other specified diabetes mellitus without complications: Secondary | ICD-10-CM | POA: Insufficient documentation

## 2014-10-28 DIAGNOSIS — M1990 Unspecified osteoarthritis, unspecified site: Secondary | ICD-10-CM | POA: Insufficient documentation

## 2014-10-28 DIAGNOSIS — Z794 Long term (current) use of insulin: Secondary | ICD-10-CM | POA: Insufficient documentation

## 2014-10-28 DIAGNOSIS — K219 Gastro-esophageal reflux disease without esophagitis: Secondary | ICD-10-CM | POA: Insufficient documentation

## 2014-10-28 DIAGNOSIS — I129 Hypertensive chronic kidney disease with stage 1 through stage 4 chronic kidney disease, or unspecified chronic kidney disease: Secondary | ICD-10-CM | POA: Insufficient documentation

## 2014-10-28 DIAGNOSIS — Z7984 Long term (current) use of oral hypoglycemic drugs: Secondary | ICD-10-CM | POA: Insufficient documentation

## 2014-10-28 DIAGNOSIS — Z79899 Other long term (current) drug therapy: Secondary | ICD-10-CM | POA: Insufficient documentation

## 2014-10-28 LAB — POCT GLYCOSYLATED HEMOGLOBIN (HGB A1C): Hemoglobin A1C: 8.6

## 2014-10-28 LAB — GLUCOSE, POCT (MANUAL RESULT ENTRY): POC Glucose: 176 mg/dl — AB (ref 70–99)

## 2014-10-28 MED ORDER — INSULIN GLARGINE 300 UNIT/ML ~~LOC~~ SOPN: 10.0000 [IU] | PEN_INJECTOR | Freq: Every day | SUBCUTANEOUS | Status: DC

## 2014-10-28 MED ORDER — GLIPIZIDE ER 5 MG PO TB24: ORAL_TABLET | ORAL | Status: DC

## 2014-10-28 MED ORDER — LOSARTAN POTASSIUM 25 MG PO TABS: 25.0000 mg | ORAL_TABLET | Freq: Every day | ORAL | Status: DC

## 2014-10-28 MED ORDER — LEVOTHYROXINE SODIUM 125 MCG PO TABS: ORAL_TABLET | ORAL | Status: DC

## 2014-10-28 NOTE — Progress Notes (Signed)
Patient reestablishing care and F/u for DM.  Patient denies pain at this time.

## 2014-10-28 NOTE — Progress Notes (Signed)
Patient ID: [name], female   DOB: 1975/04/13, 39 y.o.   MRN: [id number]   [name], is a 39 y.o. female  [id number]  [id number]  DOB - 1975-10-13  Chief Complaint  Patient presents with  . Establish Care        Subjective:   [name] is a 39 y.o. female with multiple medical history as listed below including diabetes mellitus, hypertension, psoriasis and morbid obesity here today for a follow up visit and to establish medical care with me. She has been a patient of Dr. Annitta Needs who recently relocated to [facility] [facility]. She has no new complaint today, she needs refill of some medications. She claims compliant, reports no hypoglycemic event. Patient has No headache, No chest pain, No abdominal pain - No Nausea, No new weakness tingling or numbness, No Cough - SOB. She request referral to dentist for dental care.  Problem  Other specified diabetes mellitus without complications ([place])  Thyroid Activity Decreased  Essential Hypertension  Dental Cavities    ALLERGIES: Allergies  Allergen Reactions  . Cephalexin Anaphylaxis and Shortness Of Breath    But can tolerate Ceftin and Amoxicillin per patient  . Latex Itching    Also burning from waist down  . Lisinopril Cough and Other (See Comments)    Cough  . Statins Other (See Comments) and Nausea And Vomiting    Back pain Back pain   . Augmentin [Amoxicillin-Pot Clavulanate] Rash and Swelling    Other reaction(s): Swelling Rash, eye swelling Rash, eye swelling  . Tea Rash    tanins in tea     PAST MEDICAL HISTORY: Past Medical History  Diagnosis Date  . Diabetes mellitus   . Migraine   . Hypoactive thyroid   . Cancer (HCC)     cervical  . Osteoporosis   . Hypertension   . High cholesterol   . Anginal pain ([place]) 2011    "stress per Dr Pang"-occ at present from increased stress  . GERD (gastroesophageal reflux disease)   . Psoriasis     scattered throughout body- large amount- states increased  with stress of surgery, followed by [name].   . Fatty liver     with gall bladder sludge by pt history/ no stones  . Mental disorder     bipolar  . Anxiety   . Ventral hernia 09/13/2011  . Depression   . Shortness of breath     with exertion/   ? adult onset asthma  . Bipolar disorder ([place])   . Family history of anesthesia complication     aggressiveness  . History of stress test 2013    non-specific angina, followed up with stress test, /w [facility], told results were WNL, no need for followup unless she was symptomatic  . Sleep apnea 01/2011     STOP BANG SCORE 6, Sleep study done at [place] [facility]   . Stroke [facility]) 2007    ? subacranoid bleed-  no defits  . Seizures ([place])     febrile seizures as a child  . Anemia     hx of anemia during pregnancy , blood transfusion during hospitalization, for renal calculi /w sepsis   . Arthritis     ankles- recent steroid injections in both ankles, & R wrist   . Dysrhythmia     hx MVP; single episode of PAF in the setting of sepsis 06/2012 (resolved with Cardizem)  . PONV (postoperative nausea and vomiting)  also hard to get asleep per pt; difficult IV access  . Pneumonia     hx of   . Chronic kidney disease     stones in past, hosp. at [place], 6/14, became septic, PICC line discharged from [facility]., for antibiotic Rx  . Diabetes type 2, controlled ([place])   . Arrhythmia   . Arthritis   . Morbid obesity with BMI of 50.0-59.9, adult ([place])     MEDICATIONS AT HOME: Prior to Admission medications   Medication Sig Start Date End Date Taking? Authorizing Provider  acetaminophen (TYLENOL) 500 MG tablet Take 500 mg by mouth every 6 (six) hours as needed for moderate pain.    Yes Historical Provider, MD  glipiZIDE (GLIPIZIDE XL) 5 MG 24 hr tablet TAKE 1 TABLET BY MOUTH DAILY WITH BREAKFAST. 10/28/14  Yes Tresa Garter, MD  GlucoCom Lancets MISC As directed TID and QHS 07/09/13  Yes Tresa Garter, MD  ibuprofen  (ADVIL,MOTRIN) 800 MG tablet Take 1 tablet (800 mg total) by mouth 3 (three) times daily. 02/28/14  Yes Liam Graham, PA-C  Insulin Glargine (TOUJEO SOLOSTAR) 300 UNIT/ML SOPN Inject 10 Units into the skin at bedtime. 10/28/14  Yes Tresa Garter, MD  Insulin Pen Needle 32G X 4 MM MISC Use as directed 08/11/14  Yes Lorayne Marek, MD  levothyroxine (SYNTHROID) 125 MCG tablet TAKE 1 TABLET BY MOUTH DAILY BEFORE BREAKFAST. 10/28/14  Yes Tresa Garter, MD  losartan (COZAAR) 25 MG tablet Take 1 tablet (25 mg total) by mouth daily. 10/28/14  Yes Martina Brodbeck Essie Christine, MD  metFORMIN (GLUCOPHAGE) 1000 MG tablet TAKE 1 TABLET BY MOUTH 2 TIMES DAILY WITH A MEAL. 08/11/14  Yes Deepak Advani, MD  MOVIPREP 100 G SOLR Take 1 kit (200 g total) by mouth once. 10/15/14  Yes Milus Banister, MD  TRUETEST TEST test strip USE AS INSTRUCTED 08/11/14  Yes Tresa Garter, MD  Ustekinumab (STELARA) 90 MG/ML SOSY Inject 90 mg into the skin.   Yes Historical Provider, MD     Objective:   Filed Vitals:   10/28/14 1605  BP: 128/74  Pulse: 92  Temp: 97.9 F (36.6 C)  TempSrc: Oral  Resp: 20  Height: _0  (1.676 m)  Weight: 318 lb 12.8 oz (144.607 kg)  SpO2: 97%    Exam General appearance : Awake, alert, not in any distress. Speech Clear. Not toxic looking, morbidly obese HEENT: Atraumatic and Normocephalic, pupils equally reactive to light and accomodation Neck: supple, no JVD. No cervical lymphadenopathy.  Chest:Good air entry bilaterally, no added sounds  CVS: S1 S2 regular, no murmurs.  Abdomen: Bowel sounds present, Non tender and not distended with no gaurding, rigidity or rebound. Extremities: B/L Lower Ext shows no edema, both legs are warm to touch Neurology: Awake alert, and oriented X 3, CN II-XII intact, Non focal Skin: Psoriatic Rash  Data Review Lab Results  Component Value Date   HGBA1C 8.60 10/28/2014   HGBA1C 9.70 08/11/2014   HGBA1C 7.60 04/05/2014     Assessment & Plan    1. Type 2 diabetes mellitus with other specified complication (HCC)  - POCT A1C - Glucose (CBG) - Microalbumin/Creatinine Ratio, Urine - glipiZIDE (GLIPIZIDE XL) 5 MG 24 hr tablet; TAKE 1 TABLET BY MOUTH DAILY WITH BREAKFAST.  Dispense: 90 tablet; Refill: 3 - Insulin Glargine (TOUJEO SOLOSTAR) 300 UNIT/ML SOPN; Inject 10 Units into the skin at bedtime.  Dispense: 3 mL; Refill: 3 - Ambulatory referral to Podiatry - Ambulatory  referral to Ophthalmology - COMPLETE METABOLIC PANEL WITH GFR  Aim for 30 minutes of exercise most days. Rethink what you drink. Water is great! Aim for 2-3 Carb Choices per meal (30-45 grams) +/- 1 either way  Aim for 0-15 Carbs per snack if hungry  Include protein in moderation with your meals and snacks  Consider reading food labels for Total Carbohydrate and Fat Grams of foods  Consider checking BG at alternate times per day  Continue taking medication as directed Be mindful about how much sugar you are adding to beverages and other foods. Fruit Punch - find one with no sugar  Measure and decrease portions of carbohydrate foods  Make your plate and don't go back for seconds  2. Hypothyroidism, unspecified hypothyroidism type  - levothyroxine (SYNTHROID) 125 MCG tablet; TAKE 1 TABLET BY MOUTH DAILY BEFORE BREAKFAST.  Dispense: 90 tablet; Refill: 3  3. Essential hypertension  - losartan (COZAAR) 25 MG tablet; Take 1 tablet (25 mg total) by mouth daily.  Dispense: 90 tablet; Refill: 3  We have discussed target BP range and blood pressure goal. I have advised patient to check BP regularly and to call us back or report to clinic if the numbers are consistently higher than 140/90. We discussed the importance of compliance with medical therapy and DASH diet recommended, consequences of uncontrolled hypertension discussed.  - continue current BP medications  4. Dental cavities  - Ambulatory referral to Dentistry  Patient have been counseled extensively  about nutrition and exercise  Return in about 3 months (around 01/28/2015) for Hemoglobin A1C and Follow up, DM, Follow up HTN.  The patient was given clear instructions to go to ER or return to medical center if symptoms don't improve, worsen or new problems develop. The patient verbalized understanding. The patient was told to call to get lab results if they haven't heard anything in the next week.   This note has been created with Surveyor, quantity. Any transcriptional errors are unintentional.    Angelica Chessman, MD, [place], [place], [place], [place] and [place], [place]   10/28/2014, 5:12 PM

## 2014-10-28 NOTE — Patient Instructions (Signed)
DASH Eating Plan DASH stands for "Dietary Approaches to Stop Hypertension." The DASH eating plan is a healthy eating plan that has been shown to reduce high blood pressure (hypertension). Additional health benefits may include reducing the risk of type 2 diabetes mellitus, heart disease, and stroke. The DASH eating plan may also help with weight loss. WHAT DO I NEED TO KNOW ABOUT THE DASH EATING PLAN? For the DASH eating plan, you will follow these general guidelines:  Choose foods with a percent daily value for sodium of less than 5% (as listed on the food label).  Use salt-free seasonings or herbs instead of table salt or sea salt.  Check with your health care provider or pharmacist before using salt substitutes.  Eat lower-sodium products, often labeled as "lower sodium" or "no salt added."  Eat fresh foods.  Eat more vegetables, fruits, and low-fat dairy products.  Choose whole grains. Look for the word "whole" as the first word in the ingredient list.  Choose fish and skinless chicken or turkey more often than red meat. Limit fish, poultry, and meat to 6 oz (170 g) each day.  Limit sweets, desserts, sugars, and sugary drinks.  Choose heart-healthy fats.  Limit cheese to 1 oz (28 g) per day.  Eat more home-cooked food and less restaurant, buffet, and fast food.  Limit fried foods.  Cook foods using methods other than frying.  Limit canned vegetables. If you do use them, rinse them well to decrease the sodium.  When eating at a restaurant, ask that your food be prepared with less salt, or no salt if possible. WHAT FOODS CAN I EAT? Seek help from a dietitian for individual calorie needs. Grains Whole grain or whole wheat bread. Brown rice. Whole grain or whole wheat pasta. Quinoa, bulgur, and whole grain cereals. Low-sodium cereals. Corn or whole wheat flour tortillas. Whole grain cornbread. Whole grain crackers. Low-sodium crackers. Vegetables Fresh or frozen vegetables  (raw, steamed, roasted, or grilled). Low-sodium or reduced-sodium tomato and vegetable juices. Low-sodium or reduced-sodium tomato sauce and paste. Low-sodium or reduced-sodium canned vegetables.  Fruits All fresh, canned (in natural juice), or frozen fruits. Meat and Other Protein Products Ground beef (85% or leaner), grass-fed beef, or beef trimmed of fat. Skinless chicken or turkey. Ground chicken or turkey. Pork trimmed of fat. All fish and seafood. Eggs. Dried beans, peas, or lentils. Unsalted nuts and seeds. Unsalted canned beans. Dairy Low-fat dairy products, such as skim or 1% milk, 2% or reduced-fat cheeses, low-fat ricotta or cottage cheese, or plain low-fat yogurt. Low-sodium or reduced-sodium cheeses. Fats and Oils Tub margarines without trans fats. Light or reduced-fat mayonnaise and salad dressings (reduced sodium). Avocado. Safflower, olive, or canola oils. Natural peanut or almond butter. Other Unsalted popcorn and pretzels. The items listed above may not be a complete list of recommended foods or beverages. Contact your dietitian for more options. WHAT FOODS ARE NOT RECOMMENDED? Grains White bread. White pasta. White rice. Refined cornbread. Bagels and croissants. Crackers that contain trans fat. Vegetables Creamed or fried vegetables. Vegetables in a cheese sauce. Regular canned vegetables. Regular canned tomato sauce and paste. Regular tomato and vegetable juices. Fruits Dried fruits. Canned fruit in light or heavy syrup. Fruit juice. Meat and Other Protein Products Fatty cuts of meat. Ribs, chicken wings, bacon, sausage, bologna, salami, chitterlings, fatback, hot dogs, bratwurst, and packaged luncheon meats. Salted nuts and seeds. Canned beans with salt. Dairy Whole or 2% milk, cream, half-and-half, and cream cheese. Whole-fat or sweetened yogurt. Full-fat   cheeses or blue cheese. Nondairy creamers and whipped toppings. Processed cheese, cheese spreads, or cheese  curds. Condiments Onion and garlic salt, seasoned salt, table salt, and sea salt. Canned and packaged gravies. Worcestershire sauce. Tartar sauce. Barbecue sauce. Teriyaki sauce. Soy sauce, including reduced sodium. Steak sauce. Fish sauce. Oyster sauce. Cocktail sauce. Horseradish. Ketchup and mustard. Meat flavorings and tenderizers. Bouillon cubes. Hot sauce. Tabasco sauce. Marinades. Taco seasonings. Relishes. Fats and Oils Butter, stick margarine, lard, shortening, ghee, and bacon fat. Coconut, palm kernel, or palm oils. Regular salad dressings. Other Pickles and olives. Salted popcorn and pretzels. The items listed above may not be a complete list of foods and beverages to avoid. Contact your dietitian for more information. WHERE CAN I FIND MORE INFORMATION? National Heart, Lung, and Blood Institute: travelstabloid.com   This information is not intended to replace advice given to you by your health care provider. Make sure you discuss any questions you have with your health care provider.   Document Released: 12/28/2010 Document Revised: 01/29/2014 Document Reviewed: 11/12/2012 Elsevier Interactive Patient Education 2016 [employer]. Diabetes and Exercise Exercising regularly is important. It is not just about losing weight. It has many health benefits, such as:  Improving your overall fitness, flexibility, and endurance.  Increasing your bone density.  Helping with weight control.  Decreasing your body fat.  Increasing your muscle strength.  Reducing stress and tension.  Improving your overall health. People with diabetes who exercise gain additional benefits because exercise:  Reduces appetite.  Improves the body's use of blood sugar (glucose).  Helps lower or control blood glucose.  Decreases blood pressure.  Helps control blood lipids (such as cholesterol and triglycerides).  Improves the body's use of the hormone insulin  by:  Increasing the body's insulin sensitivity.  Reducing the body's insulin needs.  Decreases the risk for heart disease because exercising:  Lowers cholesterol and triglycerides levels.  Increases the levels of good cholesterol (such as high-density lipoproteins [HDL]) in the body.  Lowers blood glucose levels. YOUR ACTIVITY PLAN  Choose an activity that you enjoy, and set realistic goals. To exercise safely, you should begin practicing any new physical activity slowly, and gradually increase the intensity of the exercise over time. Your health care provider or diabetes educator can help create an activity plan that works for you. General recommendations include:  Encouraging children to engage in at least 60 minutes of physical activity each day.  Stretching and performing strength training exercises, such as yoga or weight lifting, at least 2 times per week.  Performing a total of at least 150 minutes of moderate-intensity exercise each week, such as brisk walking or water aerobics.  Exercising at least 3 days per week, making sure you allow no more than 2 consecutive days to pass without exercising.  Avoiding long periods of inactivity (90 minutes or more). When you have to spend an extended period of time sitting down, take frequent breaks to walk or stretch. RECOMMENDATIONS FOR EXERCISING WITH TYPE 1 OR TYPE 2 DIABETES   Check your blood glucose before exercising. If blood glucose levels are greater than 240 mg/dL, check for urine ketones. Do not exercise if ketones are present.  Avoid injecting insulin into areas of the body that are going to be exercised. For example, avoid injecting insulin into:  The arms when playing tennis.  The legs when jogging.  Keep a record of:  Food intake before and after you exercise.  Expected peak times of insulin action.  Blood glucose  levels before and after you exercise.  The type and amount of exercise you have done.  Review  your records with your health care provider. Your health care provider will help you to develop guidelines for adjusting food intake and insulin amounts before and after exercising.  If you take insulin or oral hypoglycemic agents, watch for signs and symptoms of hypoglycemia. They include:  Dizziness.  Shaking.  Sweating.  Chills.  Confusion.  Drink plenty of water while you exercise to prevent dehydration or heat stroke. Body water is lost during exercise and must be replaced.  Talk to your health care provider before starting an exercise program to make sure it is safe for you. Remember, almost any type of activity is better than none.   This information is not intended to replace advice given to you by your health care provider. Make sure you discuss any questions you have with your health care provider.   Document Released: 03/31/2003 Document Revised: 05/25/2014 Document Reviewed: 06/17/2012 Elsevier Interactive Patient Education 2016 [place] Carbohydrate Counting for Diabetes Mellitus Carbohydrate counting is a method for keeping track of the amount of carbohydrates you eat. Eating carbohydrates naturally increases the level of sugar (glucose) in your blood, so it is important for you to know the amount that is okay for you to have in every meal. Carbohydrate counting helps keep the level of glucose in your blood within normal limits. The amount of carbohydrates allowed is different for every person. A dietitian can help you calculate the amount that is right for you. Once you know the amount of carbohydrates you can have, you can count the carbohydrates in the foods you want to eat. Carbohydrates are found in the following foods:  Grains, such as breads and cereals.  Dried beans and soy products.  Starchy vegetables, such as potatoes, peas, and corn.  Fruit and fruit juices.  Milk and yogurt.  Sweets and snack foods, such as cake, cookies, candy, chips, soft  drinks, and fruit drinks. CARBOHYDRATE COUNTING There are two ways to count the carbohydrates in your food. You can use either of the methods or a combination of both. Reading the "Nutrition Facts" on [place] The "Nutrition Facts" is an area that is included on the labels of almost all packaged food and beverages in the [place]. It includes the serving size of that food or beverage and information about the nutrients in each serving of the food, including the grams (g) of carbohydrate per serving.  Decide the number of servings of this food or beverage that you will be able to eat or drink. Multiply that number of servings by the number of grams of carbohydrate that is listed on the label for that serving. The total will be the amount of carbohydrates you will be having when you eat or drink this food or beverage. Learning Standard Serving Sizes of Food When you eat food that is not packaged or does not include "Nutrition Facts" on the label, you need to measure the servings in order to count the amount of carbohydrates.A serving of most carbohydrate-rich foods contains about 15 g of carbohydrates. The following list includes serving sizes of carbohydrate-rich foods that provide 15 g ofcarbohydrate per serving:   1 slice of bread (1 oz) or 1 six-inch tortilla.    of a hamburger bun or English muffin.  4-6 crackers.   cup unsweetened dry cereal.    cup hot cereal.   cup rice or pasta.  cup mashed potatoes or  of a large baked potato.  1 cup fresh fruit or one small piece of fruit.    cup canned or frozen fruit or fruit juice.  1 cup milk.   cup plain fat-free yogurt or yogurt sweetened with artificial sweeteners.   cup cooked dried beans or starchy vegetable, such as peas, corn, or potatoes.  Decide the number of standard-size servings that you will eat. Multiply that number of servings by 15 (the grams of carbohydrates in that serving). For example, if you  eat 2 cups of strawberries, you will have eaten 2 servings and 30 g of carbohydrates (2 servings x 15 g = 30 g). For foods such as soups and casseroles, in which more than one food is mixed in, you will need to count the carbohydrates in each food that is included. EXAMPLE OF CARBOHYDRATE COUNTING Sample Dinner  3 oz chicken breast.   cup of brown rice.   cup of corn.  1 cup milk.   1 cup strawberries with sugar-free whipped topping.  Carbohydrate Calculation Step 1: Identify the foods that contain carbohydrates:   Rice.   Corn.   Milk.   Strawberries. Step 2:Calculate the number of servings eaten of each:   2 servings of rice.   1 serving of corn.   1 serving of milk.   1 serving of strawberries. Step 3: Multiply each of those number of servings by 15 g:   2 servings of rice x 15 g = 30 g.   1 serving of corn x 15 g = 15 g.   1 serving of milk x 15 g = 15 g.   1 serving of strawberries x 15 g = 15 g. Step 4: Add together all of the amounts to find the total grams of carbohydrates eaten: 30 g + 15 g + 15 g + 15 g = 75 g.   This information is not intended to replace advice given to you by your health care provider. Make sure you discuss any questions you have with your health care provider.   Document Released: 01/08/2005 Document Revised: 01/29/2014 Document Reviewed: 12/05/2012 Elsevier Interactive Patient Education [employer].

## 2014-10-29 LAB — COMPLETE METABOLIC PANEL WITH GFR
ALBUMIN: 4 g/dL (ref 3.6–5.1)
ALK PHOS: 92 U/L (ref 33–115)
ALT: 71 U/L — AB (ref 6–29)
AST: 77 U/L — ABNORMAL HIGH (ref 10–30)
BILIRUBIN TOTAL: 0.8 mg/dL (ref 0.2–1.2)
BUN: 11 mg/dL (ref 7–25)
CALCIUM: 8.8 mg/dL (ref 8.6–10.2)
CHLORIDE: 102 mmol/L (ref 98–110)
CO2: 29 mmol/L (ref 20–31)
CREATININE: 0.66 mg/dL (ref 0.50–1.10)
GFR, Est Non African American: >89 mL/min (ref >=60)
Glucose, Bld: 171 mg/dL — ABNORMAL HIGH (ref 65–99)
Potassium: 4.3 mmol/L (ref 3.5–5.3)
Sodium: 138 mmol/L (ref 135–146)
Total Protein: 6.8 g/dL (ref 6.1–8.1)

## 2014-10-29 LAB — MICROALBUMIN / CREATININE URINE RATIO
Creatinine, Urine: 167.8 mg/dL
MICROALB/CREAT RATIO: 35.2 mg/g — AB (ref 0.0–30.0)
Microalb, Ur: 5.9 mg/dL — ABNORMAL HIGH (ref <2.0)

## 2014-11-02 ENCOUNTER — Encounter (HOSPITAL_COMMUNITY): Payer: Self-pay | Admitting: *Deleted

## 2014-11-04 ENCOUNTER — Encounter: Payer: Self-pay | Admitting: Gastroenterology

## 2014-11-05 ENCOUNTER — Encounter (HOSPITAL_COMMUNITY)
Admission: RE | Admit: 2014-11-05 | Discharge: 2014-11-05 | Disposition: A | Discharge status: Home / Self Care | Payer: Medicaid Other | Source: Ambulatory Visit | Attending: Gastroenterology | Admitting: Gastroenterology

## 2014-11-05 DIAGNOSIS — R112 Nausea with vomiting, unspecified: Secondary | ICD-10-CM

## 2014-11-05 DIAGNOSIS — Z86010 Personal history of colonic polyps: Secondary | ICD-10-CM | POA: Insufficient documentation

## 2014-11-05 MED ORDER — TECHNETIUM TC 99M SULFUR COLLOID: 2.1000 | Freq: Once | INTRAVENOUS | Status: DC | PRN

## 2014-11-09 ENCOUNTER — Telehealth: Payer: Self-pay | Admitting: *Deleted

## 2014-11-09 NOTE — Telephone Encounter (Signed)
Medical Assistant left message on patient's home voicemail. Voicemail states to give a call back to [place] with [facility] at [phone].

## 2014-11-09 NOTE — Telephone Encounter (Signed)
Patient verified DOB  Patient made aware of lab results falling in normal range except liver enzymes being elevated. Patient was advised to avoid/stop any medications containing Tylenol. Patient advised to continue implementing regular exercise and blood sugar control. Patient made aware of liver function being retested at next follow-up visit.  Patient had no further questions.

## 2014-11-09 NOTE — Telephone Encounter (Signed)
Pt. Returned call. Please f/u with pt. °

## 2014-11-11 ENCOUNTER — Encounter (HOSPITAL_COMMUNITY)
Admission: RE | Disposition: A | Discharge status: Home / Self Care | Payer: Self-pay | Source: Ambulatory Visit | Attending: Gastroenterology

## 2014-11-11 ENCOUNTER — Encounter (HOSPITAL_COMMUNITY): Payer: Self-pay

## 2014-11-11 ENCOUNTER — Ambulatory Visit (HOSPITAL_COMMUNITY)
Admission: RE | Admit: 2014-11-11 | Discharge: 2014-11-11 | Disposition: A | Discharge status: Home / Self Care | Payer: Medicaid Other | Source: Ambulatory Visit | Attending: Gastroenterology | Admitting: Gastroenterology

## 2014-11-11 ENCOUNTER — Ambulatory Visit (HOSPITAL_COMMUNITY): Payer: Medicaid Other | Admitting: Registered Nurse

## 2014-11-11 DIAGNOSIS — E1165 Type 2 diabetes mellitus with hyperglycemia: Secondary | ICD-10-CM | POA: Diagnosis not present

## 2014-11-11 DIAGNOSIS — Z8673 Personal history of transient ischemic attack (TIA), and cerebral infarction without residual deficits: Secondary | ICD-10-CM | POA: Insufficient documentation

## 2014-11-11 DIAGNOSIS — Z794 Long term (current) use of insulin: Secondary | ICD-10-CM | POA: Diagnosis not present

## 2014-11-11 DIAGNOSIS — Z791 Long term (current) use of non-steroidal anti-inflammatories (NSAID): Secondary | ICD-10-CM | POA: Diagnosis not present

## 2014-11-11 DIAGNOSIS — Z800 Family history of malignant neoplasm of digestive organs: Secondary | ICD-10-CM | POA: Insufficient documentation

## 2014-11-11 DIAGNOSIS — Z86010 Personal history of colonic polyps: Secondary | ICD-10-CM | POA: Insufficient documentation

## 2014-11-11 DIAGNOSIS — L409 Psoriasis, unspecified: Secondary | ICD-10-CM | POA: Insufficient documentation

## 2014-11-11 DIAGNOSIS — K219 Gastro-esophageal reflux disease without esophagitis: Secondary | ICD-10-CM | POA: Diagnosis not present

## 2014-11-11 DIAGNOSIS — K760 Fatty (change of) liver, not elsewhere classified: Secondary | ICD-10-CM | POA: Diagnosis not present

## 2014-11-11 DIAGNOSIS — R112 Nausea with vomiting, unspecified: Secondary | ICD-10-CM | POA: Diagnosis present

## 2014-11-11 DIAGNOSIS — Z6841 Body Mass Index (BMI) 40.0 and over, adult: Secondary | ICD-10-CM | POA: Insufficient documentation

## 2014-11-11 DIAGNOSIS — Z79899 Other long term (current) drug therapy: Secondary | ICD-10-CM | POA: Insufficient documentation

## 2014-11-11 DIAGNOSIS — N189 Chronic kidney disease, unspecified: Secondary | ICD-10-CM | POA: Diagnosis not present

## 2014-11-11 DIAGNOSIS — M1990 Unspecified osteoarthritis, unspecified site: Secondary | ICD-10-CM | POA: Insufficient documentation

## 2014-11-11 DIAGNOSIS — G4730 Sleep apnea, unspecified: Secondary | ICD-10-CM | POA: Insufficient documentation

## 2014-11-11 DIAGNOSIS — I129 Hypertensive chronic kidney disease with stage 1 through stage 4 chronic kidney disease, or unspecified chronic kidney disease: Secondary | ICD-10-CM | POA: Diagnosis not present

## 2014-11-11 HISTORY — PX: COLONOSCOPY WITH PROPOFOL: SHX5780

## 2014-11-11 LAB — GLUCOSE, CAPILLARY: Glucose-Capillary: 217 mg/dL — ABNORMAL HIGH (ref 65–99)

## 2014-11-11 SURGERY — COLONOSCOPY WITH PROPOFOL
Anesthesia: Monitor Anesthesia Care

## 2014-11-11 MED ORDER — PROPOFOL 10 MG/ML IV BOLUS
INTRAVENOUS | Status: AC
  Filled 2014-11-11: qty 20

## 2014-11-11 MED ORDER — PROPOFOL 10 MG/ML IV BOLUS
INTRAVENOUS | Status: DC | PRN
  Administered 2014-11-11: 40 mg via INTRAVENOUS

## 2014-11-11 MED ORDER — SODIUM CHLORIDE 0.9 % IV SOLN: INTRAVENOUS | Status: DC

## 2014-11-11 MED ORDER — PROPOFOL 500 MG/50ML IV EMUL
INTRAVENOUS | Status: DC | PRN
  Administered 2014-11-11: 140 ug/kg/min via INTRAVENOUS

## 2014-11-11 MED ORDER — LACTATED RINGERS IV SOLN
INTRAVENOUS | Status: DC
  Administered 2014-11-11: 08:00:00 via INTRAVENOUS

## 2014-11-11 MED ORDER — LACTATED RINGERS IV SOLN: INTRAVENOUS | Status: DC

## 2014-11-11 MED ORDER — LIDOCAINE HCL (CARDIAC) 20 MG/ML IV SOLN
INTRAVENOUS | Status: DC | PRN
  Administered 2014-11-11: 100 mg via INTRAVENOUS

## 2014-11-11 MED ORDER — LIDOCAINE HCL (CARDIAC) 20 MG/ML IV SOLN
INTRAVENOUS | Status: AC
  Filled 2014-11-11: qty 5

## 2014-11-11 MED ORDER — FENTANYL CITRATE (PF) 100 MCG/2ML IJ SOLN: 25.0000 ug | INTRAMUSCULAR | Status: DC | PRN

## 2014-11-11 SURGICAL SUPPLY — 22 items

## 2014-11-11 NOTE — Anesthesia Postprocedure Evaluation (Signed)
  Anesthesia Post-op Note  Patient: [name]  Procedure(s) Performed: Procedure(s) (LRB): COLONOSCOPY WITH PROPOFOL (N/A)  Patient Location: PACU  Anesthesia Type: MAC  Level of Consciousness: awake and alert   Airway and Oxygen Therapy: Patient Spontanous Breathing  Post-op Pain: mild  Post-op Assessment: Post-op Vital signs reviewed, Patient's Cardiovascular Status Stable, Respiratory Function Stable, Patent Airway and No signs of Nausea or vomiting  Last Vitals:  Filed Vitals:   11/11/14 0920  BP: 131/56  Pulse: 75  Temp:   Resp: 16    Post-op Vital Signs: stable   Complications: No apparent anesthesia complications

## 2014-11-11 NOTE — Op Note (Signed)
[facility] [place], [address]   COLONOSCOPY PROCEDURE REPORT  PATIENT: Amanda, [name]  MR#: #[id number] BIRTHDATE: 08-01-1975 , 39  yrs. old GENDER: female ENDOSCOPIST: Milus Banister, MD PROCEDURE DATE:  11/11/2014 PROCEDURE:   Colonoscopy, surveillance First Screening Colonoscopy - Avg.  risk and is 50 yrs.  old or older - No.  Prior Negative Screening - Now for repeat screening. N/A  History of Adenoma - Now for follow-up colonoscopy & has been > or = to 3 yrs.  Yes hx of adenoma.  Has been 3 or more years since last colonoscopy.  Recommend repeat exam, <10 yrs? No ASA CLASS:   Class III INDICATIONS:adenomatous polyps 2011 Dr.  Sharlett Iles (unclear how many). MEDICATIONS: Monitored anesthesia care  DESCRIPTION OF PROCEDURE:   After the risks benefits and alternatives of the procedure were thoroughly explained, informed consent was obtained.  The digital rectal exam revealed no abnormalities of the rectum.   The Pentax Ped Colon [id number] endoscope was introduced through the anus and advanced to the cecum, which was identified by both the appendix and ileocecal valve. No adverse events experienced.   The quality of the prep was good.  The instrument was then slowly withdrawn as the colon was fully examined. Estimated blood loss is zero unless otherwise noted in this procedure report.   COLON FINDINGS: A normal appearing cecum, ileocecal valve, and appendiceal orifice were identified.  The ascending, transverse, descending, sigmoid colon, and rectum appeared unremarkable. Retroflexed views revealed no abnormalities. The time to cecum = 8.0 Withdrawal time = 11.7   The scope was withdrawn and the procedure completed. COMPLICATIONS: There were no immediate complications.  ENDOSCOPIC IMPRESSION: Normal colonoscopy No polyps or cancers  RECOMMENDATIONS: You should continue to follow colorectal cancer screening guidelines for "routine  risk" patients with a repeat colonoscopy in 10 years.   eSigned:  Milus Banister, MD 11/11/2014 8:49 AM

## 2014-11-11 NOTE — Transfer of Care (Signed)
Immediate Anesthesia Transfer of Care Note  Patient: [name]  Procedure(s) Performed: Procedure(s): COLONOSCOPY WITH PROPOFOL (N/A)  Patient Location: PACU and Endoscopy Unit  Anesthesia Type:MAC  Level of Consciousness: awake, alert , oriented and patient cooperative  Airway & Oxygen Therapy: Patient Spontanous Breathing and Patient connected to face mask oxygen  Post-op Assessment: Report given to RN, Post -op Vital signs reviewed and stable and Patient moving all extremities  Post vital signs: Reviewed and stable  Last Vitals:  Filed Vitals:   11/11/14 0727  BP: 174/75  Pulse: 81  Temp: 36.8 C  Resp: 14    Complications: No apparent anesthesia complications

## 2014-11-11 NOTE — H&P (View-Only) (Signed)
HPI: This is a  very pleasant 39 year old woman whom I am meeting for the first time today  Chief complaint is history of colon polyps, intermittent nausea vomiting  She was sent a recall letter recently.   Several uncles have had colon cancer.  Her father has had numerous colon polyps.  She had a colonoscopy with Dr. Verl Blalock 2011. See all of that below.  She has chronic nausea, belching.  She had gastric emptying scan done 2011. It was a 2 hour scan, it was technically normal however she was taking Reglan at the time and so it may have been falsely normal. Recent hemoglobin A1c was 9.7. She describes vomiting food in the morning that was from the day before on a pre-frequent basis.     Colonoscopy Dr. Verl Blalock  07/2009; indication : unexplained diarrhea, hematochezia, Routine Risk  Screening FINDINGS: There were multiple polyps identified and removed. FLAT  5-7 MM POLYPS HOT AND COLD SNARE EXCISED.VERY,VERY POOR PREP.  This was otherwise a normal examination of the colon. Retroflexed  views in the rectum revealed no abnormalities. The scope was  then withdrawn from the patient and the procedure completed.    COMPLICATIONS: None  ENDOSCOPIC IMPRESSION:  1) Polyps, multiple  2) Otherwise normal examination  R/O ADENOMAS.  RECOMMENDATIONS:  REPEAT EXAM 1 YEAR WITH DOUBLE BOWEL PREP.  REPEAT EXAM: No  Pathology 1. COLON, POLYP(S), TRANSVERSE : - TUBULAR ADENOMA. - high grade dysplasia is not identified.  Copy of letter sent to her by Dr. Sharlett Iles 07/2009: "I recommend you have a repeat colonoscopy examination in 5_ years to look for recurrent polyps, as having colon polyps increases your risk for having recurrent polyps or even colon cancer in the future."     Review of systems: Pertinent positive and negative review of systems were noted in the above HPI section. Complete review of systems was performed and  was otherwise normal.   Past Medical History  Diagnosis Date  . Diabetes mellitus   . Migraine   . Hypoactive thyroid   . Cancer     cervical  . Osteoporosis   . Hypertension   . High cholesterol   . Anginal pain 2011    "stress per Dr Pang"-occ at present from increased stress  . GERD (gastroesophageal reflux disease)   . Psoriasis     scattered throughout body- large amount- states increased with stress of surgery, followed by [name].   . Fatty liver     with gall bladder sludge by pt history/ no stones  . Mental disorder     bipolar  . Anxiety   . Ventral hernia 09/13/2011  . Depression   . Shortness of breath     with exertion/   ? adult onset asthma  . Bipolar disorder   . Family history of anesthesia complication     aggressiveness  . History of stress test 2013    non-specific angina, followed up with stress test, /w [facility], told results were WNL, no need for followup unless she was symptomatic  . Sleep apnea 01/2011     STOP BANG SCORE 6, Sleep study done at [place] [facility]   . Stroke 2007    ? subacranoid bleed-  no defits  . Seizures     febrile seizures as a child  . Anemia     hx of anemia during pregnancy , blood transfusion during hospitalization, for renal calculi /w sepsis   . Arthritis  ankles- recent steroid injections in both ankles, & R wrist   . Dysrhythmia     hx MVP; single episode of PAF in the setting of sepsis 06/2012 (resolved with Cardizem)  . PONV (postoperative nausea and vomiting)     also hard to get asleep per pt; difficult IV access  . Pneumonia     hx of   . Chronic kidney disease     stones in past, hosp. at [place], 6/14, became septic, PICC line discharged from [place]., for antibiotic Rx  . Diabetes type 2, controlled   . Arrhythmia   . Arthritis   . Morbid obesity with BMI of 50.0-59.9, adult     Past Surgical History  Procedure Laterality Date  . Cesarean section  1997& 2011  . Appendectomy     . Lipoma      total 3  . Neuroplasty / transposition median nerve at carpal tunnel bilateral    . Cervical cancer      LEAP procedure  . Kidney stone surgery    . Ventral hernia repair  10/03/2011    Procedure: HERNIA REPAIR VENTRAL ADULT;  Surgeon: [name]. Tsuei, MD;  Location: [facility];  Service: General;  Laterality: N/A;  open ventral hernia repair with mesh  . Laparoscopy  10/22/2011    Procedure: LAPAROSCOPY DIAGNOSTIC;  Surgeon: [name]. Georgette Dover, MD;  Location: [facility];  Service: General;  Laterality: N/A;  . Hernia repair    . Eye surgery  10/2012    blepheroplasty- bilateral   . Dilation and curettage of uterus      several times   . Ventral hernia repair N/A 02/12/2013    Procedure: LAPAROSCOPIC VENTRAL HERNIA REPAIR ;  Surgeon: [name]. Georgette Dover, MD;  Location: [facility];  Service: General;  Laterality: N/A;  . Insertion of mesh N/A 02/12/2013    Procedure: INSERTION OF MESH;  Surgeon: [name]. Georgette Dover, MD;  Location: [facility];  Service: General;  Laterality: N/A;  . Laparoscopic lysis of adhesions N/A 02/12/2013    Procedure: LAPAROSCOPIC LYSIS OF ADHESIONS GREATER THAN TWO HOURS;  Surgeon: [name]. Georgette Dover, MD;  Location: [facility];  Service: General;  Laterality: N/A;  . Dilation and evacuation N/A 10/23/2013    Procedure: DILATATION AND EVACUATION;  Surgeon: Woodroe Mode, MD;  Location: [facility];  Service: Gynecology;  Laterality: N/A;    Current Outpatient Prescriptions  Medication Sig Dispense Refill  . acetaminophen (TYLENOL) 500 MG tablet Take 500 mg by mouth every 6 (six) hours as needed for moderate pain.     Marland Kitchen glipiZIDE (GLIPIZIDE XL) 5 MG 24 hr tablet TAKE 1 TABLET BY MOUTH DAILY WITH BREAKFAST. 30 tablet 3  . GlucoCom Lancets MISC As directed TID and QHS 100 each 2  . ibuprofen (ADVIL,MOTRIN) 800 MG tablet Take 1 tablet (800 mg total) by mouth 3 (three) times daily. 60 tablet 0  . Insulin Glargine (TOUJEO SOLOSTAR) 300 UNIT/ML SOPN Inject 10 Units into the skin at bedtime. 1.5  mL 3  . Insulin Pen Needle 32G X 4 MM MISC Use as directed 200 each 5  . levothyroxine (SYNTHROID) 125 MCG tablet TAKE 1 TABLET BY MOUTH DAILY BEFORE BREAKFAST. 90 tablet 3  . losartan (COZAAR) 25 MG tablet Take 1 tablet (25 mg total) by mouth daily. 30 tablet 3  . metFORMIN (GLUCOPHAGE) 1000 MG tablet TAKE 1 TABLET BY MOUTH 2 TIMES DAILY WITH A MEAL. 60 tablet 2  . TRUETEST TEST test strip USE AS INSTRUCTED  100 each 12  . Ustekinumab (STELARA) 90 MG/ML SOSY Inject 90 mg into the skin.     No current facility-administered medications for this visit.   Facility-Administered Medications Ordered in Other Visits  Medication Dose Route Frequency Provider Last Rate Last Dose  . midazolam (VERSED) 5 MG/5ML injection    PRN Manus Rudd [place] CRNA   2 mg at 08/06/11 0705    Allergies as of 10/15/2014 - Review Complete 08/11/2014  Allergen Reaction Noted  . Cephalexin Anaphylaxis and Shortness Of Breath   . Latex Itching 07/24/2011  . Lisinopril Cough and Other (See Comments) 07/24/2011  . Statins Other (See Comments) and Nausea And Vomiting 09/18/2011  . Augmentin [amoxicillin-pot clavulanate] Rash and Swelling 10/24/2011  . Tea Rash 07/24/2011    Family History  Problem Relation Age of Onset  . Hypertension Mother   . Glaucoma Mother   . Hypertension Father   . Cancer Father     precancerious polyps  . Cancer Maternal Uncle     colon    Social History   Social History  . Marital Status: Married    Spouse Name: N/A  . Number of Children: 3  . Years of Education: N/A   Occupational History  . Student     Social History Main Topics  . Smoking status: Never Smoker   . Smokeless tobacco: Never Used  . Alcohol Use: 0.0 oz/week    0 Standard drinks or equivalent per week  . Drug Use: No  . Sexual Activity: Yes   Other Topics Concern  . Not on file   Social History Narrative     Physical Exam: BP 118/74 mmHg  Pulse 80  Ht 5\' 6"  (1.676 m)  Wt 321 lb 2 oz (145.661  kg)  BMI 51.86 kg/m2 Constitutional: generally well-appearing Psychiatric: alert and oriented x3 Eyes: extraocular movements intact Mouth: oral pharynx moist, no lesions Neck: supple no lymphadenopathy Cardiovascular: heart regular rate and rhythm Lungs: clear to auscultation bilaterally Abdomen: soft, nontender, nondistended, no obvious ascites, no peritoneal signs, normal bowel sounds Extremities: no lower extremity edema bilaterally Skin: no lesions on visible extremities   Assessment and plan: 39 y.o. female with  personal history of colon polyps, intermittent nausea vomiting  I recommended that we proceed with colonoscopy at her soonest convenience and that she undergo a "double prep to ensure best visualization. Given her obesity, BMI greater than 50 this will have to be done at the hospital per protocol. She has poorly controlled diabetes with hemoglobin A1c of 9.7. Symptoms consistent with gastroparesis. Going to order a gastroparesis, gastric emptying scan test to try to document this more completely and if positive I will likely recommend that she start Reglan on a nightly basis at bedtime.   Owens Loffler, MD [facility] 10/15/2014, 3:09 PM  Cc: Lorayne Marek, MD

## 2014-11-11 NOTE — Anesthesia Preprocedure Evaluation (Addendum)
Anesthesia Evaluation  Patient identified by MRN, date of birth, ID band Patient awake    Reviewed: Allergy & Precautions, H&P , NPO status , Patient's Chart, lab work & pertinent test results  History of Anesthesia Complications (+) PONV and Family history of anesthesia reaction  Airway Mallampati: II  TM Distance: >3 FB     Dental  (+) Poor Dentition, Dental Advisory Given,    Pulmonary shortness of breath, sleep apnea , pneumonia, resolved,    Pulmonary exam normal        Cardiovascular hypertension, Pt. on medications + angina (stress related) with exertion Normal cardiovascular exam+ dysrhythmias      Neuro/Psych  Headaches, Anxiety Depression Bipolar Disorder    GI/Hepatic Neg liver ROS, GERD  Medicated and Controlled,  Endo/Other  diabetes, Well Controlled, Type 2, Oral Hypoglycemic AgentsHypothyroidism Morbid obesity  Renal/GU Renal InsufficiencyRenal disease     Musculoskeletal   Abdominal (+) + obese,   Peds  Hematology  (+) anemia ,   Anesthesia Other Findings   Reproductive/Obstetrics                          Anesthesia Physical Anesthesia Plan  ASA: IV  Anesthesia Plan: MAC   Post-op Pain Management:    Induction:   Airway Management Planned:   Additional Equipment:   Intra-op Plan:   Post-operative Plan:   Informed Consent:   Plan Discussed with: Surgeon  Anesthesia Plan Comments:        Anesthesia Quick Evaluation

## 2014-11-11 NOTE — Discharge Instructions (Signed)

## 2014-11-11 NOTE — Interval H&P Note (Signed)
History and Physical Interval Note:  11/11/2014 7:28 AM  [name]  has presented today for surgery, with the diagnosis of hx of colon polyps  The various methods of treatment have been discussed with the patient and family. After consideration of risks, benefits and other options for treatment, the patient has consented to  Procedure(s): COLONOSCOPY WITH PROPOFOL (N/A) as a surgical intervention .  The patient's history has been reviewed, patient examined, no change in status, stable for surgery.  I have reviewed the patient's chart and labs.  Questions were answered to the patient's satisfaction.     Milus Banister

## 2014-11-12 ENCOUNTER — Encounter (HOSPITAL_COMMUNITY): Payer: Self-pay | Admitting: Gastroenterology

## 2014-11-19 ENCOUNTER — Ambulatory Visit: Payer: Medicaid Other | Attending: Internal Medicine

## 2014-11-19 VITALS — BP 136/82 | HR 77 | Temp 98.2°F | Resp 18

## 2014-11-19 DIAGNOSIS — Z0000 Encounter for general adult medical examination without abnormal findings: Secondary | ICD-10-CM

## 2014-11-19 DIAGNOSIS — Z23 Encounter for immunization: Secondary | ICD-10-CM | POA: Insufficient documentation

## 2014-11-19 NOTE — Progress Notes (Signed)
Patient here for second Hepititis B injection. Patient denies fever and sickness in last 3 weeks. Patient has abscessed tooth and is currently on clindamycin. Last dose is today.

## 2014-12-03 ENCOUNTER — Other Ambulatory Visit: Payer: Self-pay | Admitting: Internal Medicine

## 2014-12-07 ENCOUNTER — Encounter: Payer: Self-pay | Admitting: Internal Medicine

## 2014-12-11 ENCOUNTER — Other Ambulatory Visit (HOSPITAL_COMMUNITY)
Admission: RE | Admit: 2014-12-11 | Discharge: 2014-12-11 | Disposition: A | Discharge status: Home / Self Care | Payer: Medicaid Other | Source: Ambulatory Visit | Attending: Family Medicine | Admitting: Family Medicine

## 2014-12-11 ENCOUNTER — Emergency Department (HOSPITAL_COMMUNITY)
Admission: EM | Admit: 2014-12-11 | Discharge: 2014-12-11 | Disposition: A | Discharge status: Home / Self Care | Payer: No Typology Code available for payment source | Source: Home / Self Care | Attending: Family Medicine | Admitting: Family Medicine

## 2014-12-11 ENCOUNTER — Encounter (HOSPITAL_COMMUNITY): Payer: Self-pay | Admitting: Emergency Medicine

## 2014-12-11 DIAGNOSIS — Z113 Encounter for screening for infections with a predominantly sexual mode of transmission: Secondary | ICD-10-CM | POA: Insufficient documentation

## 2014-12-11 DIAGNOSIS — N760 Acute vaginitis: Secondary | ICD-10-CM

## 2014-12-11 DIAGNOSIS — R300 Dysuria: Secondary | ICD-10-CM

## 2014-12-11 LAB — POCT URINALYSIS DIP (DEVICE)
Bilirubin Urine: NEGATIVE
Glucose, UA: 500 mg/dL — AB
Hgb urine dipstick: NEGATIVE
Ketones, ur: NEGATIVE mg/dL
Leukocytes, UA: NEGATIVE
Nitrite: NEGATIVE
Protein, ur: NEGATIVE mg/dL
Specific Gravity, Urine: 1.02 (ref 1.005–1.030)
Urobilinogen, UA: 0.2 mg/dL (ref 0.0–1.0)
pH: 5.5 (ref 5.0–8.0)

## 2014-12-11 MED ORDER — PHENAZOPYRIDINE HCL 200 MG PO TABS: 200.0000 mg | ORAL_TABLET | Freq: Three times a day (TID) | ORAL | Status: AC

## 2014-12-11 NOTE — ED Notes (Signed)
Patient had questions concerning diagnosis and treatment, notified dr Gwendlyn Deutscher.  The provider spoke with patient about discharge plan

## 2014-12-11 NOTE — ED Provider Notes (Signed)
CSN: [id number]     Arrival date & time 12/11/14  1307 History   None    No chief complaint on file.  (Consider location/radiation/quality/duration/timing/severity/associated sxs/prior Treatment) Patient is a 39 y.o. female presenting with dysuria and vaginal discharge. The history is provided by the patient. No language interpreter was used.  Dysuria Pain quality:  Burning Pain severity:  Mild Onset quality:  Gradual Duration:  3 days Timing:  Constant Progression:  Worsening Chronicity:  New Recent urinary tract infections: yes   Relieved by:  Nothing Worsened by:  Nothing tried Urinary symptoms: discolored urine   Urinary symptoms: no foul-smelling urine and no hematuria   Associated symptoms: flank pain and vaginal discharge   Associated symptoms: no abdominal pain, no fever and no nausea   Risk factors: hx of urolithiasis   Vaginal Discharge Quality:  White Onset quality:  Gradual Timing:  Constant Progression:  Worsening Chronicity:  New Context: not after intercourse and not after urination   Context comment:  She lost a tampone inside her last week but was eventually able to bring it out. Associated symptoms: dysuria   Associated symptoms: no abdominal pain, no fever and no nausea     Past Medical History  Diagnosis Date  . Diabetes mellitus   . Migraine   . Hypoactive thyroid   . Cancer (HCC)     cervical  . Osteoporosis   . Hypertension   . High cholesterol   . Anginal pain ([place]) 2011    "stress per Dr Pang"-occ at present from increased stress  . GERD (gastroesophageal reflux disease)   . Psoriasis     scattered throughout body- large amount- states increased with stress of surgery, followed by [name].   . Fatty liver     with gall bladder sludge by pt history/ no stones  . Mental disorder     bipolar  . Anxiety   . Ventral hernia 09/13/2011  . Depression   . Shortness of breath     with exertion/   ? adult onset asthma  . Bipolar disorder ([place])    . Family history of anesthesia complication     aggressiveness  . History of stress test 2013    non-specific angina, followed up with stress test, /w [facility], told results were WNL, no need for followup unless she was symptomatic  . Sleep apnea 01/2011     STOP BANG SCORE 6, Sleep study done at [place] [facility]   . Stroke [facility]) 2007    ? subacranoid bleed-  no defits  . Seizures ([place])     febrile seizures as a child  . Anemia     hx of anemia during pregnancy , blood transfusion during hospitalization, for renal calculi /w sepsis   . Arthritis     ankles- recent steroid injections in both ankles, & R wrist   . Dysrhythmia     hx MVP; single episode of PAF in the setting of sepsis 06/2012 (resolved with Cardizem)  . Pneumonia     hx of   . Chronic kidney disease     stones in past, hosp. at [place], 6/14, became septic, PICC line discharged from [place]., for antibiotic Rx  . Diabetes type 2, controlled ([place])   . Arrhythmia   . Arthritis   . Morbid obesity with BMI of 50.0-59.9, adult ([place])   . PONV (postoperative nausea and vomiting)     also hard to get asleep per pt; difficult IV  access   Past Surgical History  Procedure Laterality Date  . Cesarean section  1997& 2011  . Appendectomy    . Lipoma      total 3  . Neuroplasty / transposition median nerve at carpal tunnel bilateral    . Cervical cancer      LEAP procedure  . Kidney stone surgery    . Ventral hernia repair  10/03/2011    Procedure: HERNIA REPAIR VENTRAL ADULT;  Surgeon: [name]. Tsuei, MD;  [facility];  Service: General;  Laterality: N/A;  open ventral hernia repair with mesh  . Laparoscopy  10/22/2011    Procedure: LAPAROSCOPY DIAGNOSTIC;  Surgeon: [name]. Georgette Dover, MD;  Location: [facility];  Service: General;  Laterality: N/A;  . Hernia repair    . Eye surgery  10/2012    blepheroplasty- bilateral   . Dilation and curettage of uterus      several times   . Ventral hernia repair N/A  02/12/2013    Procedure: LAPAROSCOPIC VENTRAL HERNIA REPAIR ;  Surgeon: [name]. Georgette Dover, MD;  Location: [facility];  Service: General;  Laterality: N/A;  . Insertion of mesh N/A 02/12/2013    Procedure: INSERTION OF MESH;  Surgeon: [name]. Georgette Dover, MD;  Location: [facility];  Service: General;  Laterality: N/A;  . Laparoscopic lysis of adhesions N/A 02/12/2013    Procedure: LAPAROSCOPIC LYSIS OF ADHESIONS GREATER THAN TWO HOURS;  Surgeon: [name]. Georgette Dover, MD;  Location: [facility];  Service: General;  Laterality: N/A;  . Dilation and evacuation N/A 10/23/2013    Procedure: DILATATION AND EVACUATION;  Surgeon: Woodroe Mode, MD;  Location: [facility];  Service: Gynecology;  Laterality: N/A;  . Colonoscopy with propofol N/A 11/11/2014    Procedure: COLONOSCOPY WITH PROPOFOL;  Surgeon: Milus Banister, MD;  [facility];  Service: Endoscopy;  Laterality: N/A;   Family History  Problem Relation Age of Onset  . Hypertension Mother   . Glaucoma Mother   . Hypertension Father   . Cancer Father     precancerious polyps  . Cancer Maternal Uncle     colon   Social History  Substance Use Topics  . Smoking status: Never Smoker   . Smokeless tobacco: Never Used  . Alcohol Use: No   OB History    Gravida Para Term Preterm AB TAB SAB Ectopic Multiple Living   8 3 3  0 4 0 4 0 0 3     Review of Systems  Constitutional: Negative for fever.  Respiratory: Negative.   Cardiovascular: Negative.   Gastrointestinal: Negative for nausea and abdominal pain.  Genitourinary: Positive for dysuria, flank pain and vaginal discharge.  All other systems reviewed and are negative.   Allergies  Cephalexin; Latex; Lisinopril; Statins; Augmentin; and Tea  Home Medications   Prior to Admission medications   Medication Sig Start Date End Date Taking? Authorizing Provider  acetaminophen (TYLENOL) 500 MG tablet Take 1,000 mg by mouth every 6 (six) hours as needed for moderate pain.     Historical Provider, MD   glipiZIDE (GLIPIZIDE XL) 5 MG 24 hr tablet TAKE 1 TABLET BY MOUTH DAILY WITH BREAKFAST. 10/28/14   Tresa Garter, MD  GlucoCom Lancets MISC As directed TID and QHS 07/09/13   Tresa Garter, MD  guaiFENesin (ROBITUSSIN) 100 MG/5ML liquid Take 200 mg by mouth every 6 (six) hours as needed for cough.    Historical Provider, MD  Insulin Glargine (TOUJEO SOLOSTAR) 300 UNIT/ML SOPN Inject 10  Units into the skin at bedtime. 10/28/14   Tresa Garter, MD  Insulin Pen Needle 32G X 4 MM MISC Use as directed 08/11/14   Lorayne Marek, MD  levothyroxine (SYNTHROID) 125 MCG tablet TAKE 1 TABLET BY MOUTH DAILY BEFORE BREAKFAST. 10/28/14   Tresa Garter, MD  losartan (COZAAR) 25 MG tablet Take 1 tablet (25 mg total) by mouth daily. 10/28/14   Tresa Garter, MD  metFORMIN (GLUCOPHAGE) 1000 MG tablet TAKE 1 TABLET BY MOUTH 2 TIMES DAILY WITH A MEAL. 12/09/14   Tresa Garter, MD  MOVIPREP 100 G SOLR Take 1 kit (200 g total) by mouth once. 10/15/14   Milus Banister, MD  sodium chloride (OCEAN) 0.65 % SOLN nasal spray Place 2 sprays into both nostrils daily as needed for congestion.    Historical Provider, MD  triprolidine-pseudoephedrine (APRODINE) 2.5-60 MG TABS tablet Take 1 tablet by mouth at bedtime as needed for allergies or congestion.    Historical Provider, MD  TRUETEST TEST test strip USE AS INSTRUCTED 08/11/14   Tresa Garter, MD  Ustekinumab (STELARA) 90 MG/ML SOSY Inject 90 mg into the skin every 3 (three) months.     Historical Provider, MD   Meds Ordered and Administered this Visit  Medications - No data to display  BP 128/69 mmHg  Pulse 80  Temp(Src) 98 F (36.7 C) (Oral)  Resp 18  SpO2 95% No data found.   Physical Exam  Constitutional: She appears well-developed. No distress.  Cardiovascular: Normal rate, regular rhythm, normal heart sounds and intact distal pulses.   No murmur heard. Pulmonary/Chest: Effort normal and breath sounds normal. No  respiratory distress. She has no wheezes.  Abdominal: Soft. Bowel sounds are normal. She exhibits no distension and no mass. There is no tenderness.  Genitourinary: There is no rash, tenderness or lesion on the right labia. There is no rash, tenderness or lesion on the left labia. Cervix exhibits no motion tenderness, no discharge and no friability. Right adnexum displays no mass. Left adnexum displays no mass. No vaginal discharge found.  Nursing note and vitals reviewed.   ED Course  Procedures (including critical care time)  Labs Review Labs Reviewed - No data to display  Imaging Review No results found.   Visual Acuity Review  Right Eye Distance:   Left Eye Distance:   Bilateral Distance:    Right Eye Near:   Left Eye Near:    Bilateral Near:      Urinalysis    Component Value Date/Time   COLORURINE YELLOW 07/03/2014 0536   APPEARANCEUR CLEAR 07/03/2014 0536   LABSPEC 1.020 12/11/2014 1404   PHURINE 5.5 12/11/2014 1404   GLUCOSEU 500* 12/11/2014 1404   HGBUR NEGATIVE 12/11/2014 1404   BILIRUBINUR NEGATIVE 12/11/2014 1404   KETONESUR NEGATIVE 12/11/2014 1404   PROTEINUR NEGATIVE 12/11/2014 1404   UROBILINOGEN 0.2 12/11/2014 1404   NITRITE NEGATIVE 12/11/2014 1404   LEUKOCYTESUR NEGATIVE 12/11/2014 1404        MDM  No diagnosis found. Dysuria  Vaginitis  UA only significant for glycosuria, she is diabetic.  Neg nitrite and leukocyte. Her presentation unlikely UTI. May be interstitial cystitis. I will treat with Pyridium x 2 day. Return precaution discussed. Keep self well hydrated.  Pelvic exam done today. No retained tampon. Normal looking vaginal discharge. Specimen taken for wet prep, GC/Chlamydia. I will contact her with result.    Kinnie Feil, MD 12/11/14 [phone]

## 2014-12-11 NOTE — ED Notes (Signed)
Patient has multiple concerns: patient concerned for possible uti.  Symptoms onset 11/15.  Patient complains of knee pain 11/05.  Patient concerned for yeast infection, reports minimal vaginal discharge, and itching

## 2014-12-11 NOTE — Discharge Instructions (Signed)

## 2014-12-14 LAB — CERVICOVAGINAL ANCILLARY ONLY: Wet Prep (BD Affirm): POSITIVE — AB

## 2014-12-15 ENCOUNTER — Other Ambulatory Visit: Payer: Self-pay | Admitting: Internal Medicine

## 2014-12-15 DIAGNOSIS — E039 Hypothyroidism, unspecified: Secondary | ICD-10-CM

## 2014-12-15 LAB — CERVICOVAGINAL ANCILLARY ONLY
Chlamydia: NEGATIVE
Neisseria Gonorrhea: NEGATIVE

## 2014-12-15 MED ORDER — LEVOTHYROXINE SODIUM 125 MCG PO TABS: ORAL_TABLET | ORAL | Status: DC

## 2014-12-16 ENCOUNTER — Telehealth: Payer: Self-pay | Admitting: Family Medicine

## 2014-12-16 ENCOUNTER — Other Ambulatory Visit: Payer: Self-pay | Admitting: Family Medicine

## 2014-12-16 MED ORDER — FLUCONAZOLE 150 MG PO TABS: 150.0000 mg | ORAL_TABLET | Freq: Once | ORAL | Status: DC

## 2014-12-16 NOTE — Telephone Encounter (Signed)
Expand All Collapse All  I got patient's wet prep result today. + yeast infection. I contacted patient she stated she is out of town and wanted me to call in prescription to [facility] at [place]. Diflucan E-prescribed.

## 2014-12-16 NOTE — Telephone Encounter (Signed)
I got patient's wet prep result today. + yeast infection. I contacted patient she stated she is out of town and wanted me to call in prescription to [facility] at [place]. Diflucan E-prescribed.

## 2014-12-17 NOTE — ED Notes (Signed)
Final report of vaginal swab testing negative for infectious pathogens, positive for yeast only. Treatment adequate w Rx Difulcan

## 2014-12-27 ENCOUNTER — Ambulatory Visit: Payer: No Typology Code available for payment source | Attending: Podiatry

## 2014-12-27 ENCOUNTER — Other Ambulatory Visit: Payer: Self-pay | Admitting: Sports Medicine

## 2014-12-27 DIAGNOSIS — R0989 Other specified symptoms and signs involving the circulatory and respiratory systems: Secondary | ICD-10-CM

## 2014-12-28 ENCOUNTER — Telehealth: Payer: Self-pay | Admitting: *Deleted

## 2014-12-28 NOTE — Telephone Encounter (Signed)
Scheduled pts appt for Vascular studies with ABIs for Thursday, 12-30-14 at [place] [facility] to let her know. She confirmed that she could make this appt.

## 2014-12-30 ENCOUNTER — Ambulatory Visit (HOSPITAL_COMMUNITY)
Admission: RE | Admit: 2014-12-30 | Discharge: 2014-12-30 | Disposition: A | Discharge status: Home / Self Care | Payer: Medicaid Other | Source: Ambulatory Visit | Attending: Sports Medicine | Admitting: Sports Medicine

## 2014-12-30 ENCOUNTER — Encounter: Payer: Self-pay | Admitting: Internal Medicine

## 2014-12-30 ENCOUNTER — Ambulatory Visit: Payer: Medicaid Other | Attending: Internal Medicine | Admitting: Internal Medicine

## 2014-12-30 ENCOUNTER — Ambulatory Visit (HOSPITAL_COMMUNITY): Payer: Medicaid Other

## 2014-12-30 VITALS — BP 135/81 | HR 93 | Temp 98.1°F | Resp 18 | Ht 66.0 in | Wt 316.2 lb

## 2014-12-30 DIAGNOSIS — Z87442 Personal history of urinary calculi: Secondary | ICD-10-CM | POA: Diagnosis not present

## 2014-12-30 DIAGNOSIS — Z79899 Other long term (current) drug therapy: Secondary | ICD-10-CM | POA: Diagnosis not present

## 2014-12-30 DIAGNOSIS — Z888 Allergy status to other drugs, medicaments and biological substances status: Secondary | ICD-10-CM | POA: Diagnosis not present

## 2014-12-30 DIAGNOSIS — Z8673 Personal history of transient ischemic attack (TIA), and cerebral infarction without residual deficits: Secondary | ICD-10-CM | POA: Insufficient documentation

## 2014-12-30 DIAGNOSIS — Z880 Allergy status to penicillin: Secondary | ICD-10-CM | POA: Insufficient documentation

## 2014-12-30 DIAGNOSIS — B373 Candidiasis of vulva and vagina: Secondary | ICD-10-CM | POA: Diagnosis not present

## 2014-12-30 DIAGNOSIS — B3731 Acute candidiasis of vulva and vagina: Secondary | ICD-10-CM

## 2014-12-30 DIAGNOSIS — I129 Hypertensive chronic kidney disease with stage 1 through stage 4 chronic kidney disease, or unspecified chronic kidney disease: Secondary | ICD-10-CM | POA: Diagnosis not present

## 2014-12-30 DIAGNOSIS — E7800 Pure hypercholesterolemia, unspecified: Secondary | ICD-10-CM | POA: Diagnosis not present

## 2014-12-30 DIAGNOSIS — I10 Essential (primary) hypertension: Secondary | ICD-10-CM | POA: Insufficient documentation

## 2014-12-30 DIAGNOSIS — Z794 Long term (current) use of insulin: Secondary | ICD-10-CM | POA: Insufficient documentation

## 2014-12-30 DIAGNOSIS — F319 Bipolar disorder, unspecified: Secondary | ICD-10-CM | POA: Insufficient documentation

## 2014-12-30 DIAGNOSIS — Z91040 Latex allergy status: Secondary | ICD-10-CM | POA: Diagnosis not present

## 2014-12-30 DIAGNOSIS — E119 Type 2 diabetes mellitus without complications: Secondary | ICD-10-CM | POA: Insufficient documentation

## 2014-12-30 DIAGNOSIS — Z7984 Long term (current) use of oral hypoglycemic drugs: Secondary | ICD-10-CM | POA: Insufficient documentation

## 2014-12-30 DIAGNOSIS — Z881 Allergy status to other antibiotic agents status: Secondary | ICD-10-CM | POA: Diagnosis not present

## 2014-12-30 DIAGNOSIS — I341 Nonrheumatic mitral (valve) prolapse: Secondary | ICD-10-CM | POA: Insufficient documentation

## 2014-12-30 DIAGNOSIS — Z6841 Body Mass Index (BMI) 40.0 and over, adult: Secondary | ICD-10-CM | POA: Insufficient documentation

## 2014-12-30 DIAGNOSIS — Z91018 Allergy to other foods: Secondary | ICD-10-CM | POA: Insufficient documentation

## 2014-12-30 DIAGNOSIS — K219 Gastro-esophageal reflux disease without esophagitis: Secondary | ICD-10-CM | POA: Diagnosis not present

## 2014-12-30 DIAGNOSIS — I480 Paroxysmal atrial fibrillation: Secondary | ICD-10-CM | POA: Insufficient documentation

## 2014-12-30 DIAGNOSIS — L409 Psoriasis, unspecified: Secondary | ICD-10-CM | POA: Diagnosis not present

## 2014-12-30 DIAGNOSIS — M1711 Unilateral primary osteoarthritis, right knee: Secondary | ICD-10-CM | POA: Insufficient documentation

## 2014-12-30 DIAGNOSIS — F419 Anxiety disorder, unspecified: Secondary | ICD-10-CM | POA: Diagnosis not present

## 2014-12-30 DIAGNOSIS — G4730 Sleep apnea, unspecified: Secondary | ICD-10-CM | POA: Diagnosis not present

## 2014-12-30 DIAGNOSIS — R0989 Other specified symptoms and signs involving the circulatory and respiratory systems: Secondary | ICD-10-CM | POA: Diagnosis not present

## 2014-12-30 LAB — GLUCOSE, POCT (MANUAL RESULT ENTRY)
POC GLUCOSE: 333 mg/dL — AB (ref 70–99)
POC Glucose: 355 mg/dl — AB (ref 70–99)

## 2014-12-30 LAB — POCT GLYCOSYLATED HEMOGLOBIN (HGB A1C): HEMOGLOBIN A1C: 10.1

## 2014-12-30 MED ORDER — DAPAGLIFLOZIN PROPANEDIOL 10 MG PO TABS: 10.0000 mg | ORAL_TABLET | Freq: Every day | ORAL | Status: DC

## 2014-12-30 MED ORDER — FLUCONAZOLE 200 MG PO TABS: 200.0000 mg | ORAL_TABLET | Freq: Every day | ORAL | Status: DC

## 2014-12-30 MED ORDER — MICONAZOLE NITRATE 4 % VA CREA: TOPICAL_CREAM | VAGINAL | Status: DC

## 2014-12-30 MED ORDER — INSULIN ASPART 100 UNIT/ML ~~LOC~~ SOLN
10.0000 [IU] | Freq: Once | SUBCUTANEOUS | Status: AC
  Administered 2014-12-30: 10 [IU] via SUBCUTANEOUS

## 2014-12-30 NOTE — Progress Notes (Signed)
Patient ID: [name], female   DOB: 1975-05-28, 39 y.o.   MRN: [id number]   [name], is a 39 y.o. female  PIR:[id number]  YTK:[id number]  DOB - October 16, 1975  Chief Complaint  Patient presents with  . Knee Pain        Subjective:   [name] is a 39 y.o. female with extensive medical history and allergies as listed below including type 2 diabetes mellitus, morbid obesity and knee osteoarthritis here today for a follow up visit. Her major complaint today is right knee pain, requesting referral to orthopedics surgery. Right knee pain has been there for quite a while, getting worse occasionally, right now the right knee gives out. Pain is worse on walking, right knee. His beginning to affect the left knee as well because of too much pressure. Patient claims compliant with her medications for diabetes but blood sugar remains uncontrolled. Patient is also trying with her diet and weight loss. Patient has No headache, No chest pain, No abdominal pain - No Nausea, No new weakness tingling or numbness, No Cough - SOB.  No problems updated.  ALLERGIES: Allergies  Allergen Reactions  . Cephalexin Anaphylaxis and Shortness Of Breath    But can tolerate Ceftin and Amoxicillin per patient  . Latex Itching    Also burning from waist down  . Lisinopril Cough and Other (See Comments)    Cough  . Statins Other (See Comments) and Nausea And Vomiting    Back pain Back pain   . Augmentin [Amoxicillin-Pot Clavulanate] Rash and Swelling    Other reaction(s): Swelling Rash, eye swelling Rash, eye swelling  . Tea Rash    tanins in tea     PAST MEDICAL HISTORY: Past Medical History  Diagnosis Date  . Diabetes mellitus   . Migraine   . Hypoactive thyroid   . Cancer (HCC)     cervical  . Osteoporosis   . Hypertension   . High cholesterol   . Anginal pain ([place]) 2011    "stress per Dr Pang"-occ at present from increased stress  . GERD (gastroesophageal reflux disease)   .  Psoriasis     scattered throughout body- large amount- states increased with stress of surgery, followed by [name].   . Fatty liver     with gall bladder sludge by pt history/ no stones  . Mental disorder     bipolar  . Anxiety   . Ventral hernia 09/13/2011  . Depression   . Shortness of breath     with exertion/   ? adult onset asthma  . Bipolar disorder ([place])   . Family history of anesthesia complication     aggressiveness  . History of stress test 2013    non-specific angina, followed up with stress test, /w [facility], told results were WNL, no need for followup unless she was symptomatic  . Sleep apnea 01/2011     STOP BANG SCORE 6, Sleep study done at [place] center   . Stroke [facility]) 2007    ? subacranoid bleed-  no defits  . Seizures ([place])     febrile seizures as a child  . Anemia     hx of anemia during pregnancy , blood transfusion during hospitalization, for renal calculi /w sepsis   . Arthritis     ankles- recent steroid injections in both ankles, & R wrist   . Dysrhythmia     hx MVP; single episode of PAF in the setting of  sepsis 06/2012 (resolved with Cardizem)  . Pneumonia     hx of   . Chronic kidney disease     stones in past, hosp. at [place], 6/14, became septic, PICC line discharged from [place]., for antibiotic Rx  . Diabetes type 2, controlled ([place])   . Arrhythmia   . Arthritis   . Morbid obesity with BMI of 50.0-59.9, adult ([place])   . PONV (postoperative nausea and vomiting)     also hard to get asleep per pt; difficult IV access    MEDICATIONS AT HOME: Prior to Admission medications   Medication Sig Start Date End Date Taking? Authorizing Provider  glipiZIDE (GLIPIZIDE XL) 5 MG 24 hr tablet TAKE 1 TABLET BY MOUTH DAILY WITH BREAKFAST. 10/28/14  Yes Tresa Garter, MD  GlucoCom Lancets MISC As directed TID and QHS 07/09/13  Yes  E Doreene Burke, MD  guaiFENesin (ROBITUSSIN) 100 MG/5ML liquid Take 200 mg by mouth every 6 (six)  hours as needed for cough.   Yes Historical Provider, MD  Insulin Glargine (TOUJEO SOLOSTAR) 300 UNIT/ML SOPN Inject 10 Units into the skin at bedtime. 10/28/14  Yes Tresa Garter, MD  Insulin Pen Needle 32G X 4 MM MISC Use as directed 08/11/14  Yes Lorayne Marek, MD  levothyroxine (SYNTHROID) 125 MCG tablet TAKE 1 TABLET BY MOUTH DAILY BEFORE BREAKFAST. 12/15/14  Yes Tresa Garter, MD  losartan (COZAAR) 25 MG tablet Take 1 tablet (25 mg total) by mouth daily. 10/28/14  Yes Tresa Garter, MD  metFORMIN (GLUCOPHAGE) 1000 MG tablet TAKE 1 TABLET BY MOUTH 2 TIMES DAILY WITH A MEAL. 12/09/14  Yes  Essie Christine, MD  MOVIPREP 100 G SOLR Take 1 kit (200 g total) by mouth once. 10/15/14  Yes Milus Banister, MD  sodium chloride (OCEAN) 0.65 % SOLN nasal spray Place 2 sprays into both nostrils daily as needed for congestion.   Yes Historical Provider, MD  triprolidine-pseudoephedrine (APRODINE) 2.5-60 MG TABS tablet Take 1 tablet by mouth at bedtime as needed for allergies or congestion.   Yes Historical Provider, MD  TRUETEST TEST test strip USE AS INSTRUCTED 08/11/14  Yes  E Doreene Burke, MD  Ustekinumab (STELARA) 90 MG/ML SOSY Inject 90 mg into the skin every 3 (three) months.    Yes Historical Provider, MD  acetaminophen (TYLENOL) 500 MG tablet Take 1,000 mg by mouth every 6 (six) hours as needed for moderate pain.     Historical Provider, MD  dapagliflozin propanediol (FARXIGA) 10 MG TABS tablet Take 10 mg by mouth daily. 12/30/14   Tresa Garter, MD  fluconazole (DIFLUCAN) 200 MG tablet Take 1 tablet (200 mg total) by mouth daily. 12/30/14   Tresa Garter, MD  MICONAZOLE NITRATE VAGINAL (MICONAZOLE 3) 4 % CREA Apply 2 time per day 12/30/14   Tresa Garter, MD  phenazopyridine (PYRIDIUM) 200 MG tablet Take 1 tablet (200 mg total) by mouth 3 (three) times daily with meals. Patient not taking: Reported on 12/30/2014 12/11/14   Kinnie Feil, MD     Objective:    Filed Vitals:   12/30/14 1602  BP: 135/81  Pulse: 93  Temp: 98.1 F (36.7 C)  TempSrc: Oral  Resp: 18  Height: 5' 6" (1.676 m)  Weight: 316 lb 3.2 oz (143.427 kg)  SpO2: 96%    Exam General appearance : Awake, alert, not in any distress. Speech Clear. Not toxic looking HEENT: Atraumatic and Normocephalic, pupils equally reactive to light and accomodation Neck: supple, no  JVD. No cervical lymphadenopathy.  Chest:Good air entry bilaterally, no added sounds  CVS: S1 S2 regular, no murmurs.  Abdomen: Bowel sounds present, Non tender and not distended with no gaurding, rigidity or rebound. Extremities: B/L Lower Ext shows no edema, both legs are warm to touch Neurology: Awake alert, and oriented X 3, CN II-XII intact, Non focal  Data Review Lab Results  Component Value Date   HGBA1C 10.10 12/30/2014   HGBA1C 8.60 10/28/2014   HGBA1C 9.70 08/11/2014     Assessment & Plan   1. Type 2 diabetes mellitus without complication, with long-term current use of insulin (HCC)  - POCT A1C has gone up to 10.10% - Glucose (CBG) - insulin aspart (novoLOG) injection 10 Units; Inject 0.1 mLs (10 Units total) into the skin once. - Glucose (CBG) - Ambulatory referral to diabetic education  Add - dapagliflozin propanediol (FARXIGA) 10 MG TABS tablet; Take 10 mg by mouth daily.  Dispense: 30 tablet; Refill: 3 Continue Insulin and Metformin  2. Essential hypertension  We have discussed target BP range and blood pressure goal. I have advised patient to check BP regularly and to call us back or report to clinic if the numbers are consistently higher than 140/90. We discussed the importance of compliance with medical therapy and DASH diet recommended, consequences of uncontrolled hypertension discussed.   - continue current BP medications  3. Psoriasis Stable  4. Morbid obesity due to excess calories (HCC)  Aim for 30 minutes of exercise most days. Rethink what you drink. Water is  great! Aim for 2-3 Carb Choices per meal (30-45 grams) +/- 1 either way  Aim for 0-15 Carbs per snack if hungry  Include protein in moderation with your meals and snacks  Consider reading food labels for Total Carbohydrate and Fat Grams of foods  Consider checking BG at alternate times per day  Continue taking medication as directed Be mindful about how much sugar you are adding to beverages and other foods. Fruit Punch - find one with no sugar  Measure and decrease portions of carbohydrate foods  Make your plate and don't go back for seconds   5. Vaginal candidosis Preventive - fluconazole (DIFLUCAN) 200 MG tablet; Take 1 tablet (200 mg total) by mouth daily.  Dispense: 1 tablet; Refill: 1 - MICONAZOLE NITRATE VAGINAL (MICONAZOLE 3) 4 % CREA; Apply 2 time per day  Dispense: 25 g; Refill: 3  6. Primary osteoarthritis of right knee  - Ambulatory referral to Orthopedic Surgery  Patient have been counseled extensively about nutrition and exercise  Return in about 6 weeks (around 02/10/2015) for Follow up HTN, Blood sugar log review, Follow up Pain and comorbidities.  The patient was given clear instructions to go to ER or return to medical center if symptoms don't improve, worsen or new problems develop. The patient verbalized understanding. The patient was told to call to get lab results if they haven't heard anything in the next week.   This note has been created with Surveyor, quantity. Any transcriptional errors are unintentional.    Angelica Chessman, MD, [place], [place], [place], [place] and [place], [place]   12/30/2014, 4:59 PM

## 2014-12-30 NOTE — Progress Notes (Signed)
VASCULAR LAB PRELIMINARY  ARTERIAL  ABI completed:    RIGHT    LEFT    PRESSURE WAVEFORM  PRESSURE WAVEFORM  BRACHIAL 177 Triphasic BRACHIAL 169 Triphasic  DP 195 Triphasic DP 213 Triphasic  AT   AT    PT 217 Triphasic PT 213 Triphasic  PER   PER    GREAT TOE  NA GREAT TOE  NA    RIGHT LEFT  ABI 1.23 1.20   Bilateral ABIs are within normal limits.   12/30/2014 9:37 AM Maudry Mayhew, RVT, RDCS, RDMS

## 2014-12-30 NOTE — Progress Notes (Signed)
Patient here for RT knee pain and referral to Ortho  Patient complains of right knee pain at a 2 right now. Patient states pain increases with walking to a 6 and sometimes buckles on her.  Patient would like ortho referral. She complains of her left knee beginning to hurt from applying so much pressure on knee.

## 2014-12-30 NOTE — Patient Instructions (Signed)
Diabetes and Exercise Exercising regularly is important. It is not just about losing weight. It has many health benefits, such as:  Improving your overall fitness, flexibility, and endurance.  Increasing your bone density.  Helping with weight control.  Decreasing your body fat.  Increasing your muscle strength.  Reducing stress and tension.  Improving your overall health. People with diabetes who exercise gain additional benefits because exercise:  Reduces appetite.  Improves the body's use of blood sugar (glucose).  Helps lower or control blood glucose.  Decreases blood pressure.  Helps control blood lipids (such as cholesterol and triglycerides).  Improves the body's use of the hormone insulin by:  Increasing the body's insulin sensitivity.  Reducing the body's insulin needs.  Decreases the risk for heart disease because exercising:  Lowers cholesterol and triglycerides levels.  Increases the levels of good cholesterol (such as high-density lipoproteins [HDL]) in the body.  Lowers blood glucose levels. YOUR ACTIVITY PLAN  Choose an activity that you enjoy, and set realistic goals. To exercise safely, you should begin practicing any new physical activity slowly, and gradually increase the intensity of the exercise over time. Your health care provider or diabetes educator can help create an activity plan that works for you. General recommendations include:  Encouraging children to engage in at least 60 minutes of physical activity each day.  Stretching and performing strength training exercises, such as yoga or weight lifting, at least 2 times per week.  Performing a total of at least 150 minutes of moderate-intensity exercise each week, such as brisk walking or water aerobics.  Exercising at least 3 days per week, making sure you allow no more than 2 consecutive days to pass without exercising.  Avoiding long periods of inactivity (90 minutes or more). When you  have to spend an extended period of time sitting down, take frequent breaks to walk or stretch. RECOMMENDATIONS FOR EXERCISING WITH TYPE 1 OR TYPE 2 DIABETES   Check your blood glucose before exercising. If blood glucose levels are greater than 240 mg/dL, check for urine ketones. Do not exercise if ketones are present.  Avoid injecting insulin into areas of the body that are going to be exercised. For example, avoid injecting insulin into:  The arms when playing tennis.  The legs when jogging.  Keep a record of:  Food intake before and after you exercise.  Expected peak times of insulin action.  Blood glucose levels before and after you exercise.  The type and amount of exercise you have done.  Review your records with your health care provider. Your health care provider will help you to develop guidelines for adjusting food intake and insulin amounts before and after exercising.  If you take insulin or oral hypoglycemic agents, watch for signs and symptoms of hypoglycemia. They include:  Dizziness.  Shaking.  Sweating.  Chills.  Confusion.  Drink plenty of water while you exercise to prevent dehydration or heat stroke. Body water is lost during exercise and must be replaced.  Talk to your health care provider before starting an exercise program to make sure it is safe for you. Remember, almost any type of activity is better than none.   This information is not intended to replace advice given to you by your health care provider. Make sure you discuss any questions you have with your health care provider.   Document Released: 03/31/2003 Document Revised: 05/25/2014 Document Reviewed: 06/17/2012 Elsevier Interactive Patient Education 2016 Elsevier Inc. Basic Carbohydrate Counting for Diabetes Mellitus Carbohydrate counting   is a method for keeping track of the amount of carbohydrates you eat. Eating carbohydrates naturally increases the level of sugar (glucose) in your  blood, so it is important for you to know the amount that is okay for you to have in every meal. Carbohydrate counting helps keep the level of glucose in your blood within normal limits. The amount of carbohydrates allowed is different for every person. A dietitian can help you calculate the amount that is right for you. Once you know the amount of carbohydrates you can have, you can count the carbohydrates in the foods you want to eat. Carbohydrates are found in the following foods:  Grains, such as breads and cereals.  Dried beans and soy products.  Starchy vegetables, such as potatoes, peas, and corn.  Fruit and fruit juices.  Milk and yogurt.  Sweets and snack foods, such as cake, cookies, candy, chips, soft drinks, and fruit drinks. CARBOHYDRATE COUNTING There are two ways to count the carbohydrates in your food. You can use either of the methods or a combination of both. Reading the "Nutrition Facts" on Packaged Food The "Nutrition Facts" is an area that is included on the labels of almost all packaged food and beverages in the [place]. It includes the serving size of that food or beverage and information about the nutrients in each serving of the food, including the grams (g) of carbohydrate per serving.  Decide the number of servings of this food or beverage that you will be able to eat or drink. Multiply that number of servings by the number of grams of carbohydrate that is listed on the label for that serving. The total will be the amount of carbohydrates you will be having when you eat or drink this food or beverage. Learning Standard Serving Sizes of Food When you eat food that is not packaged or does not include "Nutrition Facts" on the label, you need to measure the servings in order to count the amount of carbohydrates.A serving of most carbohydrate-rich foods contains about 15 g of carbohydrates. The following list includes serving sizes of carbohydrate-rich foods that  provide 15 g ofcarbohydrate per serving:   1 slice of bread (1 oz) or 1 six-inch tortilla.    of a hamburger bun or English muffin.  4-6 crackers.   cup unsweetened dry cereal.    cup hot cereal.   cup rice or pasta.    cup mashed potatoes or  of a large baked potato.  1 cup fresh fruit or one small piece of fruit.    cup canned or frozen fruit or fruit juice.  1 cup milk.   cup plain fat-free yogurt or yogurt sweetened with artificial sweeteners.   cup cooked dried beans or starchy vegetable, such as peas, corn, or potatoes.  Decide the number of standard-size servings that you will eat. Multiply that number of servings by 15 (the grams of carbohydrates in that serving). For example, if you eat 2 cups of strawberries, you will have eaten 2 servings and 30 g of carbohydrates (2 servings x 15 g = 30 g). For foods such as soups and casseroles, in which more than one food is mixed in, you will need to count the carbohydrates in each food that is included. EXAMPLE OF CARBOHYDRATE COUNTING Sample Dinner  3 oz chicken breast.   cup of brown rice.   cup of corn.  1 cup milk.   1 cup strawberries with sugar-free whipped topping.  Carbohydrate Calculation Step   1: Identify the foods that contain carbohydrates:   Rice.   Corn.   Milk.   Strawberries. Step 2:Calculate the number of servings eaten of each:   2 servings of rice.   1 serving of corn.   1 serving of milk.   1 serving of strawberries. Step 3: Multiply each of those number of servings by 15 g:   2 servings of rice x 15 g = 30 g.   1 serving of corn x 15 g = 15 g.   1 serving of milk x 15 g = 15 g.   1 serving of strawberries x 15 g = 15 g. Step 4: Add together all of the amounts to find the total grams of carbohydrates eaten: 30 g + 15 g + 15 g + 15 g = 75 g.   This information is not intended to replace advice given to you by your health care provider. Make sure you  discuss any questions you have with your health care provider.   Document Released: 01/08/2005 Document Revised: 01/29/2014 Document Reviewed: 12/05/2012 Elsevier Interactive Patient Education 2016 Elsevier Inc.  

## 2014-12-31 ENCOUNTER — Ambulatory Visit (INDEPENDENT_AMBULATORY_CARE_PROVIDER_SITE_OTHER): Payer: Self-pay | Admitting: Sports Medicine

## 2014-12-31 ENCOUNTER — Other Ambulatory Visit: Payer: Self-pay | Admitting: Sports Medicine

## 2014-12-31 ENCOUNTER — Ambulatory Visit
Admission: RE | Admit: 2014-12-31 | Discharge: 2014-12-31 | Disposition: A | Discharge status: Home / Self Care | Payer: No Typology Code available for payment source | Source: Ambulatory Visit | Attending: Sports Medicine | Admitting: Sports Medicine

## 2014-12-31 ENCOUNTER — Other Ambulatory Visit: Payer: Self-pay | Admitting: Internal Medicine

## 2014-12-31 ENCOUNTER — Encounter: Payer: Self-pay | Admitting: Sports Medicine

## 2014-12-31 VITALS — BP 135/80 | Ht 66.0 in | Wt 318.0 lb

## 2014-12-31 DIAGNOSIS — E119 Type 2 diabetes mellitus without complications: Secondary | ICD-10-CM

## 2014-12-31 DIAGNOSIS — M25561 Pain in right knee: Secondary | ICD-10-CM

## 2014-12-31 DIAGNOSIS — Z794 Long term (current) use of insulin: Principal | ICD-10-CM

## 2014-12-31 DIAGNOSIS — I10 Essential (primary) hypertension: Secondary | ICD-10-CM

## 2014-12-31 DIAGNOSIS — E1169 Type 2 diabetes mellitus with other specified complication: Secondary | ICD-10-CM

## 2014-12-31 DIAGNOSIS — B3731 Acute candidiasis of vulva and vagina: Secondary | ICD-10-CM

## 2014-12-31 DIAGNOSIS — B373 Candidiasis of vulva and vagina: Secondary | ICD-10-CM

## 2014-12-31 DIAGNOSIS — E039 Hypothyroidism, unspecified: Secondary | ICD-10-CM

## 2014-12-31 MED ORDER — TRAMADOL HCL 50 MG PO TABS: 50.0000 mg | ORAL_TABLET | Freq: Four times a day (QID) | ORAL | Status: DC | PRN

## 2014-12-31 MED ORDER — LOSARTAN POTASSIUM 25 MG PO TABS
25.0000 mg | ORAL_TABLET | Freq: Every day | ORAL | Status: DC
Start: 2014-12-31 — End: 2015-04-28

## 2014-12-31 MED ORDER — DICLOFENAC SODIUM 75 MG PO TBEC: DELAYED_RELEASE_TABLET | ORAL | Status: DC

## 2014-12-31 MED ORDER — DAPAGLIFLOZIN PROPANEDIOL 10 MG PO TABS: 10.0000 mg | ORAL_TABLET | Freq: Every day | ORAL | Status: DC

## 2014-12-31 MED ORDER — INSULIN GLARGINE 300 UNIT/ML ~~LOC~~ SOPN: 10.0000 [IU] | PEN_INJECTOR | Freq: Every day | SUBCUTANEOUS | Status: DC

## 2014-12-31 MED ORDER — LEVOTHYROXINE SODIUM 125 MCG PO TABS: ORAL_TABLET | ORAL | Status: DC

## 2014-12-31 MED ORDER — FLUCONAZOLE 200 MG PO TABS
200.0000 mg | ORAL_TABLET | Freq: Every day | ORAL | Status: DC
Start: 2014-12-31 — End: 2015-01-07

## 2014-12-31 MED ORDER — MICONAZOLE NITRATE 4 % VA CREA: TOPICAL_CREAM | VAGINAL | Status: AC

## 2014-12-31 MED ORDER — METFORMIN HCL 1000 MG PO TABS: 1000.0000 mg | ORAL_TABLET | Freq: Two times a day (BID) | ORAL | Status: DC

## 2014-12-31 NOTE — Progress Notes (Signed)
   Subjective:    Patient ID: [name], female    DOB: 1975/11/09, 39 y.o.   MRN: [id number]  HPI chief complaint: Right knee pain  39 year old female comes in today complaining of several weeks of right knee pain. No trauma that she can recall but rather a gradual onset of pain that is diffuse around the knee. Worse with activity. She describes it as an aching discomfort. She does notice intermittent swelling. Pain is primarily along the medial aspect of the knee. No prior knee surgeries. No locking or catching. Pain improves some at rest. She has been using ice and compression with little improvement. She has also been using over-the-counter ibuprofen with little improvement. She has not had any recent imaging.  Past history reviewed. Significant for psoriasis Medications reviewed Allergies reviewed    Review of Systems    as above Objective:   Physical Exam  Obese. No acute distress  Right knee: Range of motion 0-100. No obvious effusion. She is tender to palpation along the medial joint line with pain but no popping with McMurray's. No tenderness to palpation along the lateral joint line. Good joint stability. No popliteal cyst. Trace patellofemoral crepitus. Neurovascularly intact distally.      Assessment & Plan:  Right knee pain likely secondary to DJD versus possible psoriatic arthritis versus degenerative medial meniscal tear  I discussed a cortisone injection with the patient but she is on a medication for her psoriasis which precludes her from taking steroids. I've asked her to double check with her dermatologist to make sure that this also includes cortisone injections. In the meantime we will try Voltaren 75 mg twice daily with food as needed. I will also give her some tramadol to take as needed for more severe pain. Ace wrap for compression. X-rays including AP, lateral, 30 flexion, and sunrise views. Patient will follow-up with me in 2 weeks. We will discuss  x-ray findings at that time and reconsider merits of cortisone injection as long as there is no objection from her dermatologist.

## 2015-01-03 ENCOUNTER — Other Ambulatory Visit: Payer: Self-pay | Admitting: *Deleted

## 2015-01-03 DIAGNOSIS — E039 Hypothyroidism, unspecified: Secondary | ICD-10-CM

## 2015-01-03 MED ORDER — LEVOTHYROXINE SODIUM 125 MCG PO TABS: ORAL_TABLET | ORAL | Status: DC

## 2015-01-05 ENCOUNTER — Telehealth: Payer: Self-pay | Admitting: Internal Medicine

## 2015-01-05 ENCOUNTER — Encounter: Payer: Self-pay | Admitting: Internal Medicine

## 2015-01-05 ENCOUNTER — Other Ambulatory Visit: Payer: Self-pay | Admitting: Internal Medicine

## 2015-01-05 DIAGNOSIS — E1169 Type 2 diabetes mellitus with other specified complication: Secondary | ICD-10-CM

## 2015-01-05 MED ORDER — INSULIN GLARGINE 300 UNIT/ML ~~LOC~~ SOPN
10.0000 [IU] | PEN_INJECTOR | Freq: Every day | SUBCUTANEOUS | Status: DC
Start: 2015-01-05 — End: 2015-04-28

## 2015-01-05 NOTE — Telephone Encounter (Signed)
Patient called stating that she is having a reaction to Acyclovir. Patient stated that it has caused a herpes outbreak and would like something else called in. Please f/u

## 2015-01-07 ENCOUNTER — Other Ambulatory Visit: Payer: Self-pay | Admitting: Internal Medicine

## 2015-01-07 DIAGNOSIS — B3731 Acute candidiasis of vulva and vagina: Secondary | ICD-10-CM

## 2015-01-07 DIAGNOSIS — A6000 Herpesviral infection of urogenital system, unspecified: Secondary | ICD-10-CM

## 2015-01-07 DIAGNOSIS — B373 Candidiasis of vulva and vagina: Secondary | ICD-10-CM

## 2015-01-07 MED ORDER — FLUCONAZOLE 200 MG PO TABS: 200.0000 mg | ORAL_TABLET | Freq: Every day | ORAL | Status: DC

## 2015-01-07 MED ORDER — ACYCLOVIR 400 MG PO TABS: 400.0000 mg | ORAL_TABLET | Freq: Two times a day (BID) | ORAL | Status: DC

## 2015-01-07 NOTE — Progress Notes (Signed)
Responding to patient's e-mail request, patient is currently has breakout of herpes in and around the vagina opening as well as soreness and whitish discharge that is similar to her previous vaginal candidiasis. Patient had outbreak of herpes genitalis in the past and the current outbreak is similar. Of note patient recently started [place] which increases the chances of developing vaginal candidiasis.  I have prescribed Diflucan and acyclovir to the pharmacy for patient's pickup. Patient is aware and agreeable to this plan.  Ned Clines, MD 01/07/2015

## 2015-01-10 NOTE — Telephone Encounter (Signed)
Patients concerns have been addressed per patients email with Nira Conn and Dr. Doreene Burke.

## 2015-01-11 ENCOUNTER — Other Ambulatory Visit: Payer: Self-pay | Admitting: Internal Medicine

## 2015-01-12 ENCOUNTER — Ambulatory Visit (INDEPENDENT_AMBULATORY_CARE_PROVIDER_SITE_OTHER): Payer: Self-pay | Admitting: Sports Medicine

## 2015-01-12 ENCOUNTER — Encounter: Payer: Self-pay | Admitting: Sports Medicine

## 2015-01-12 VITALS — BP 146/98 | Ht 66.0 in | Wt 318.0 lb

## 2015-01-12 DIAGNOSIS — M25561 Pain in right knee: Secondary | ICD-10-CM

## 2015-01-12 NOTE — Progress Notes (Signed)
   Subjective:    Patient ID: [name], female    DOB: 1975/08/31, 39 y.o.   MRN: [id number]  HPI   Patient comes in today for follow-up on right knee pain. Her pain has completely resolved on diclofenac. She is very happy. No longer needing her brace. X-rays revealed some minimal degenerative changes in the right knee but nothing acute.    Review of Systems     Objective:   Physical Exam  Obese. No acute distress  Right knee: Full range of motion. No effusion. Trace patellofemoral crepitus. No joint line tenderness to palpation. Good joint stability. Neurovascular intact distally. Walking without a limp.  X-rays are as above      Assessment & Plan:  Right knee pain secondary to mild DJD Obesity History of psoriasis  Patient very well may have mild underlying psoriatic arthritis. Diclofenac has helped not only her knee pain but also her hand and wrist pain. She can continue with her diclofenac as needed. I did show her some quad strengthening exercises to help strengthen her knee. She understands the importance of weight loss in the overall health of her knee. Follow-up as needed.  Of note, patient did check with her dermatologist about cortisone injections. Her dermatologist said that the injections will not affect the psoriasis study that she is enrolled in. However, her PCP would like for her hemoglobin A1c to be lower (currently above 10) before entertaining the idea of any cortisone injections. Obviously we do not need to consider that today but I would consider a cortisone injection in the future if her knee pain does not continue to respond to diclofenac.

## 2015-01-25 MED FILL — LEVOTHYROXINE 125 MCG TAB: 125 | 30 days supply | Qty: 30 | Fill #3

## 2015-01-25 MED FILL — FLUCONAZOLE 200 MG TABLET: 200 | 10 days supply | Qty: 10 | Fill #1

## 2015-01-25 MED FILL — LOSARTAN POTASSIUM 25 MG TA: 25 | 30 days supply | Qty: 30 | Fill #3

## 2015-01-25 MED FILL — ?DICLOFENAC SOD DR 75 MG TA: 75 | 30 days supply | Qty: 60 | Fill #1

## 2015-01-25 MED FILL — glipiZIDE XL 5 MG TB24: 5 | 30 days supply | Qty: 30 | Fill #3

## 2015-01-25 MED FILL — ?FARXIGA 10 MG TABLET: 10 | 30 days supply | Qty: 30 | Fill #1

## 2015-01-25 MED FILL — metFORMIN HCL 1000 MG TABS: 1000 | 30 days supply | Qty: 60 | Fill #2

## 2015-01-27 ENCOUNTER — Encounter: Payer: Self-pay | Admitting: Internal Medicine

## 2015-01-27 ENCOUNTER — Other Ambulatory Visit: Payer: Self-pay | Admitting: Pharmacist

## 2015-01-27 MED ORDER — GLUCOSE BLOOD VI STRP: ORAL_STRIP | Status: AC

## 2015-01-27 MED ORDER — TRUE METRIX METER W/DEVICE KIT: PACK | Status: AC

## 2015-01-27 MED FILL — $TOUJEO SOLOSTAR 300 UNIT/M: 300 | 90 days supply | Qty: 3 | Fill #0

## 2015-01-27 MED FILL — !TRUE METRIX BLOOD GLUCOSE: 30 days supply | Qty: 1 | Fill #0

## 2015-01-27 MED FILL — TRUE METRIX TEST STRIP: 25 days supply | Qty: 100 | Fill #0

## 2015-02-01 ENCOUNTER — Encounter: Payer: Self-pay | Admitting: Skilled Nursing Facility1

## 2015-02-01 ENCOUNTER — Encounter: Payer: BLUE CROSS/BLUE SHIELD | Attending: Internal Medicine | Admitting: Skilled Nursing Facility1

## 2015-02-01 VITALS — Ht 66.0 in | Wt 310.0 lb

## 2015-02-01 DIAGNOSIS — Z713 Dietary counseling and surveillance: Secondary | ICD-10-CM | POA: Insufficient documentation

## 2015-02-01 DIAGNOSIS — E119 Type 2 diabetes mellitus without complications: Secondary | ICD-10-CM | POA: Diagnosis not present

## 2015-02-01 NOTE — Progress Notes (Signed)
Diabetes Self-Management Education  Visit Type: First/Initial  Appt. Start Time: 8:00 Appt. End Time: 9:30  02/01/2015  [name], identified by name and date of birth, is a 40 y.o. female with a diagnosis of Diabetes: Type 2.   ASSESSMENT  Height 5\' 6"  (1.676 m), weight 310 lb (140.615 kg). Body mass index is 50.06 kg/(m^2).  Pt states about her diabetes: "Nothings wrong so I do not need to control it." Pt states "why should I control it if there is nothing wrong with me.". Pt states she  Started checking her blood glucose for a new years resolution and currently takes it 6 times a day-Dietitian advised she speak with her doctor about this due to finger pain from checking it so many times in one day. 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 Pt states she Wakes at 4:30am and is in bed 12am-with horrible sleep-up and down urinating throughout the night and drinking coke. Pt was Visibly figity. Pt states She does not drink any water and drinks a 2Liter a day of coke.  A1C 8.6.     Diabetes Self-Management Education - 02/01/15 [id number]    Visit Information   Visit Type First/Initial   Initial Visit   Diabetes Type Type 2   Are you currently following a meal plan? No   Are you taking your medications as prescribed? Yes   Date Diagnosed 2011   Health Coping   How would you rate your overall health? Fair   Psychosocial Assessment   Patient Belief/Attitude about Diabetes Defeat/Burnout   Self-care barriers None   Self-management support Family   Patient Concerns Healthy Lifestyle   Special Needs Simplified materials   Preferred Learning Style Auditory   Learning Readiness Not Ready   Complications   How often do you check your  blood sugar? 0 times/day (not testing)   Fasting Blood glucose range (mg/dL) 70-129   Postprandial Blood glucose range (mg/dL) >200   Number of hypoglycemic episodes per month 0   Number of hyperglycemic episodes per week 5   Have you had a dilated eye exam in the past 12 months? No   Have you had a dental exam in the past 12 months? Yes   Are you checking your feet? No   Dietary Intake   Breakfast grits or oatmeal   Lunch [employer]   Snack (afternoon) cheese and meat   Dinner kilbasa, green bean, mcaroni and cheese, bread   Beverage(s) milk, coke   Exercise   Exercise Type Light (walking / raking leaves)   How many days per week to you exercise? 3   How many minutes per day do you exercise? 30   Total minutes per week of exercise 90   Patient Education   Previous Diabetes Education Yes (please comment)  2007-gestational diabetes-with Maggie May RD,LDN   Disease state  Definition of diabetes, type 1 and 2, and the diagnosis of diabetes;Factors that contribute to the development of diabetes   Nutrition management  Role of diet in the treatment of diabetes and the relationship between the three main macronutrients and blood glucose level;Food label reading, portion sizes and measuring food.;Carbohydrate counting;Reviewed blood glucose goals for pre and post meals and how to evaluate  the patients' food intake on their blood glucose level.;Information on hints to eating out and maintain blood glucose control.   Physical activity and exercise  Role of exercise on diabetes management, blood pressure control and cardiac health.;Identified with patient nutritional and/or medication changes necessary with exercise.;Helped patient identify appropriate exercises in relation to his/her diabetes, diabetes complications and other health issue.   Monitoring Interpreting lab values - A1C, lipid, urine microalbumina.;Daily foot exams;Yearly dilated eye exam   Chronic complications Relationship between  chronic complications and blood glucose control;Dental care;Assessed and discussed foot care and prevention of foot problems;Identified and discussed with patient  current chronic complications   Psychosocial adjustment Identified and addressed patients feelings and concerns about diabetes   Individualized Goals (developed by patient)   Nutrition Follow meal plan discussed;General guidelines for healthy choices and portions discussed;Adjust meds/carbs with exercise as discussed   Physical Activity Exercise 3-5 times per week;45 minutes per day   Medications take my medication as prescribed   Monitoring  test my blood glucose as discussed  ask your doctor for recomendations and tests at different times throughout the week   Reducing Risk increase portions of nuts and seeds;do foot checks daily   Outcomes   Expected Outcomes Demonstrated interest in learning. Expect positive outcomes   Future DMSE PRN   Program Status Completed      Individualized Plan for Diabetes Self-Management Training:   Learning Objective:  Patient will have a greater understanding of diabetes self-management. Patient education plan is to attend individual and/or group sessions per assessed needs and concerns.   Plan:   There are no Patient Instructions on file for this visit.  Expected Outcomes:  Demonstrated interest in learning. Expect positive outcomes  Education material provided: Living Well with Diabetes, Meal plan card and Snack sheet  If problems or questions, patient to contact team via:  Phone  Future DSME appointment: PRN

## 2015-02-11 ENCOUNTER — Telehealth: Payer: Self-pay | Admitting: Internal Medicine

## 2015-02-11 NOTE — Telephone Encounter (Signed)
Patient dropped off paperwork to be filled by doctor. Patient is requesting it can be complete by 1/27 Please follow up.

## 2015-02-14 ENCOUNTER — Ambulatory Visit: Payer: No Typology Code available for payment source

## 2015-02-14 ENCOUNTER — Ambulatory Visit: Payer: No Typology Code available for payment source | Admitting: Internal Medicine

## 2015-02-14 NOTE — Telephone Encounter (Signed)
Medical Assistant left message on patient's home and cell voicemail. Voicemail states to give a call back to [place] with [facility] at [phone].  Please inform patient of an appointment needing to be scheduled for a physical to complete paperwork.

## 2015-02-18 ENCOUNTER — Ambulatory Visit: Payer: No Typology Code available for payment source

## 2015-02-21 ENCOUNTER — Telehealth: Payer: Self-pay | Admitting: Clinical

## 2015-02-21 ENCOUNTER — Ambulatory Visit: Payer: No Typology Code available for payment source

## 2015-02-21 ENCOUNTER — Telehealth: Payer: Self-pay | Admitting: *Deleted

## 2015-02-21 ENCOUNTER — Encounter: Payer: Self-pay | Admitting: Internal Medicine

## 2015-02-21 ENCOUNTER — Ambulatory Visit: Payer: BLUE CROSS/BLUE SHIELD | Attending: Internal Medicine | Admitting: Internal Medicine

## 2015-02-21 VITALS — BP 137/85 | HR 70 | Temp 97.7°F | Resp 18 | Ht 66.0 in | Wt 315.0 lb

## 2015-02-21 DIAGNOSIS — E7800 Pure hypercholesterolemia, unspecified: Secondary | ICD-10-CM | POA: Diagnosis not present

## 2015-02-21 DIAGNOSIS — Z8541 Personal history of malignant neoplasm of cervix uteri: Secondary | ICD-10-CM | POA: Diagnosis not present

## 2015-02-21 DIAGNOSIS — J45909 Unspecified asthma, uncomplicated: Secondary | ICD-10-CM | POA: Insufficient documentation

## 2015-02-21 DIAGNOSIS — Z8673 Personal history of transient ischemic attack (TIA), and cerebral infarction without residual deficits: Secondary | ICD-10-CM | POA: Diagnosis not present

## 2015-02-21 DIAGNOSIS — Z794 Long term (current) use of insulin: Secondary | ICD-10-CM | POA: Insufficient documentation

## 2015-02-21 DIAGNOSIS — Z23 Encounter for immunization: Secondary | ICD-10-CM

## 2015-02-21 DIAGNOSIS — B009 Herpesviral infection, unspecified: Secondary | ICD-10-CM | POA: Insufficient documentation

## 2015-02-21 DIAGNOSIS — E119 Type 2 diabetes mellitus without complications: Secondary | ICD-10-CM | POA: Diagnosis not present

## 2015-02-21 DIAGNOSIS — K219 Gastro-esophageal reflux disease without esophagitis: Secondary | ICD-10-CM | POA: Diagnosis not present

## 2015-02-21 DIAGNOSIS — Z6841 Body Mass Index (BMI) 40.0 and over, adult: Secondary | ICD-10-CM | POA: Insufficient documentation

## 2015-02-21 DIAGNOSIS — L409 Psoriasis, unspecified: Secondary | ICD-10-CM | POA: Insufficient documentation

## 2015-02-21 DIAGNOSIS — Z880 Allergy status to penicillin: Secondary | ICD-10-CM | POA: Insufficient documentation

## 2015-02-21 DIAGNOSIS — M1990 Unspecified osteoarthritis, unspecified site: Secondary | ICD-10-CM | POA: Insufficient documentation

## 2015-02-21 DIAGNOSIS — E1122 Type 2 diabetes mellitus with diabetic chronic kidney disease: Secondary | ICD-10-CM | POA: Insufficient documentation

## 2015-02-21 DIAGNOSIS — Z888 Allergy status to other drugs, medicaments and biological substances status: Secondary | ICD-10-CM | POA: Diagnosis not present

## 2015-02-21 DIAGNOSIS — G4730 Sleep apnea, unspecified: Secondary | ICD-10-CM | POA: Insufficient documentation

## 2015-02-21 DIAGNOSIS — Z79899 Other long term (current) drug therapy: Secondary | ICD-10-CM | POA: Diagnosis not present

## 2015-02-21 DIAGNOSIS — F319 Bipolar disorder, unspecified: Secondary | ICD-10-CM | POA: Diagnosis not present

## 2015-02-21 DIAGNOSIS — N189 Chronic kidney disease, unspecified: Secondary | ICD-10-CM | POA: Insufficient documentation

## 2015-02-21 DIAGNOSIS — I10 Essential (primary) hypertension: Secondary | ICD-10-CM

## 2015-02-21 DIAGNOSIS — Z0289 Encounter for other administrative examinations: Secondary | ICD-10-CM

## 2015-02-21 DIAGNOSIS — Z7984 Long term (current) use of oral hypoglycemic drugs: Secondary | ICD-10-CM | POA: Diagnosis not present

## 2015-02-21 DIAGNOSIS — F909 Attention-deficit hyperactivity disorder, unspecified type: Secondary | ICD-10-CM | POA: Insufficient documentation

## 2015-02-21 DIAGNOSIS — A6000 Herpesviral infection of urogenital system, unspecified: Secondary | ICD-10-CM

## 2015-02-21 DIAGNOSIS — Z020 Encounter for examination for admission to educational institution: Secondary | ICD-10-CM | POA: Insufficient documentation

## 2015-02-21 DIAGNOSIS — I129 Hypertensive chronic kidney disease with stage 1 through stage 4 chronic kidney disease, or unspecified chronic kidney disease: Secondary | ICD-10-CM | POA: Diagnosis not present

## 2015-02-21 LAB — GLUCOSE, POCT (MANUAL RESULT ENTRY): POC GLUCOSE: 149 mg/dL — AB (ref 70–99)

## 2015-02-21 MED ORDER — ACYCLOVIR 400 MG PO TABS: 400.0000 mg | ORAL_TABLET | Freq: Two times a day (BID) | ORAL | Status: DC

## 2015-02-21 NOTE — Patient Instructions (Signed)
Diabetes and Exercise Exercising regularly is important. It is not just about losing weight. It has many health benefits, such as:  Improving your overall fitness, flexibility, and endurance.  Increasing your bone density.  Helping with weight control.  Decreasing your body fat.  Increasing your muscle strength.  Reducing stress and tension.  Improving your overall health. People with diabetes who exercise gain additional benefits because exercise:  Reduces appetite.  Improves the body's use of blood sugar (glucose).  Helps lower or control blood glucose.  Decreases blood pressure.  Helps control blood lipids (such as cholesterol and triglycerides).  Improves the body's use of the hormone insulin by:  Increasing the body's insulin sensitivity.  Reducing the body's insulin needs.  Decreases the risk for heart disease because exercising:  Lowers cholesterol and triglycerides levels.  Increases the levels of good cholesterol (such as high-density lipoproteins [HDL]) in the body.  Lowers blood glucose levels. YOUR ACTIVITY PLAN  Choose an activity that you enjoy, and set realistic goals. To exercise safely, you should begin practicing any new physical activity slowly, and gradually increase the intensity of the exercise over time. Your health care provider or diabetes educator can help create an activity plan that works for you. General recommendations include:  Encouraging children to engage in at least 60 minutes of physical activity each day.  Stretching and performing strength training exercises, such as yoga or weight lifting, at least 2 times per week.  Performing a total of at least 150 minutes of moderate-intensity exercise each week, such as brisk walking or water aerobics.  Exercising at least 3 days per week, making sure you allow no more than 2 consecutive days to pass without exercising.  Avoiding long periods of inactivity (90 minutes or more). When you  have to spend an extended period of time sitting down, take frequent breaks to walk or stretch. RECOMMENDATIONS FOR EXERCISING WITH TYPE 1 OR TYPE 2 DIABETES   Check your blood glucose before exercising. If blood glucose levels are greater than 240 mg/dL, check for urine ketones. Do not exercise if ketones are present.  Avoid injecting insulin into areas of the body that are going to be exercised. For example, avoid injecting insulin into:  The arms when playing tennis.  The legs when jogging.  Keep a record of:  Food intake before and after you exercise.  Expected peak times of insulin action.  Blood glucose levels before and after you exercise.  The type and amount of exercise you have done.  Review your records with your health care provider. Your health care provider will help you to develop guidelines for adjusting food intake and insulin amounts before and after exercising.  If you take insulin or oral hypoglycemic agents, watch for signs and symptoms of hypoglycemia. They include:  Dizziness.  Shaking.  Sweating.  Chills.  Confusion.  Drink plenty of water while you exercise to prevent dehydration or heat stroke. Body water is lost during exercise and must be replaced.  Talk to your health care provider before starting an exercise program to make sure it is safe for you. Remember, almost any type of activity is better than none.   This information is not intended to replace advice given to you by your health care provider. Make sure you discuss any questions you have with your health care provider.   Document Released: 03/31/2003 Document Revised: 05/25/2014 Document Reviewed: 06/17/2012 Elsevier Interactive Patient Education 2016 Elsevier Inc. Basic Carbohydrate Counting for Diabetes Mellitus Carbohydrate counting   is a method for keeping track of the amount of carbohydrates you eat. Eating carbohydrates naturally increases the level of sugar (glucose) in your  blood, so it is important for you to know the amount that is okay for you to have in every meal. Carbohydrate counting helps keep the level of glucose in your blood within normal limits. The amount of carbohydrates allowed is different for every person. A dietitian can help you calculate the amount that is right for you. Once you know the amount of carbohydrates you can have, you can count the carbohydrates in the foods you want to eat. Carbohydrates are found in the following foods:  Grains, such as breads and cereals.  Dried beans and soy products.  Starchy vegetables, such as potatoes, peas, and corn.  Fruit and fruit juices.  Milk and yogurt.  Sweets and snack foods, such as cake, cookies, candy, chips, soft drinks, and fruit drinks. CARBOHYDRATE COUNTING There are two ways to count the carbohydrates in your food. You can use either of the methods or a combination of both. Reading the "Nutrition Facts" on Packaged Food The "Nutrition Facts" is an area that is included on the labels of almost all packaged food and beverages in the [place]. It includes the serving size of that food or beverage and information about the nutrients in each serving of the food, including the grams (g) of carbohydrate per serving.  Decide the number of servings of this food or beverage that you will be able to eat or drink. Multiply that number of servings by the number of grams of carbohydrate that is listed on the label for that serving. The total will be the amount of carbohydrates you will be having when you eat or drink this food or beverage. Learning Standard Serving Sizes of Food When you eat food that is not packaged or does not include "Nutrition Facts" on the label, you need to measure the servings in order to count the amount of carbohydrates.A serving of most carbohydrate-rich foods contains about 15 g of carbohydrates. The following list includes serving sizes of carbohydrate-rich foods that  provide 15 g ofcarbohydrate per serving:   1 slice of bread (1 oz) or 1 six-inch tortilla.    of a hamburger bun or English muffin.  4-6 crackers.   cup unsweetened dry cereal.    cup hot cereal.   cup rice or pasta.    cup mashed potatoes or  of a large baked potato.  1 cup fresh fruit or one small piece of fruit.    cup canned or frozen fruit or fruit juice.  1 cup milk.   cup plain fat-free yogurt or yogurt sweetened with artificial sweeteners.   cup cooked dried beans or starchy vegetable, such as peas, corn, or potatoes.  Decide the number of standard-size servings that you will eat. Multiply that number of servings by 15 (the grams of carbohydrates in that serving). For example, if you eat 2 cups of strawberries, you will have eaten 2 servings and 30 g of carbohydrates (2 servings x 15 g = 30 g). For foods such as soups and casseroles, in which more than one food is mixed in, you will need to count the carbohydrates in each food that is included. EXAMPLE OF CARBOHYDRATE COUNTING Sample Dinner  3 oz chicken breast.   cup of brown rice.   cup of corn.  1 cup milk.   1 cup strawberries with sugar-free whipped topping.  Carbohydrate Calculation Step   1: Identify the foods that contain carbohydrates:   Rice.   Corn.   Milk.   Strawberries. Step 2:Calculate the number of servings eaten of each:   2 servings of rice.   1 serving of corn.   1 serving of milk.   1 serving of strawberries. Step 3: Multiply each of those number of servings by 15 g:   2 servings of rice x 15 g = 30 g.   1 serving of corn x 15 g = 15 g.   1 serving of milk x 15 g = 15 g.   1 serving of strawberries x 15 g = 15 g. Step 4: Add together all of the amounts to find the total grams of carbohydrates eaten: 30 g + 15 g + 15 g + 15 g = 75 g.   This information is not intended to replace advice given to you by your health care provider. Make sure you  discuss any questions you have with your health care provider.   Document Released: 01/08/2005 Document Revised: 01/29/2014 Document Reviewed: 12/05/2012 Elsevier Interactive Patient Education 2016 Elsevier Inc. DASH Eating Plan DASH stands for "Dietary Approaches to Stop Hypertension." The DASH eating plan is a healthy eating plan that has been shown to reduce high blood pressure (hypertension). Additional health benefits may include reducing the risk of type 2 diabetes mellitus, heart disease, and stroke. The DASH eating plan may also help with weight loss. WHAT DO I NEED TO KNOW ABOUT THE DASH EATING PLAN? For the DASH eating plan, you will follow these general guidelines:  Choose foods with a percent daily value for sodium of less than 5% (as listed on the food label).  Use salt-free seasonings or herbs instead of table salt or sea salt.  Check with your health care provider or pharmacist before using salt substitutes.  Eat lower-sodium products, often labeled as "lower sodium" or "no salt added."  Eat fresh foods.  Eat more vegetables, fruits, and low-fat dairy products.  Choose whole grains. Look for the word "whole" as the first word in the ingredient list.  Choose fish and skinless chicken or turkey more often than red meat. Limit fish, poultry, and meat to 6 oz (170 g) each day.  Limit sweets, desserts, sugars, and sugary drinks.  Choose heart-healthy fats.  Limit cheese to 1 oz (28 g) per day.  Eat more home-cooked food and less restaurant, buffet, and fast food.  Limit fried foods.  Cook foods using methods other than frying.  Limit canned vegetables. If you do use them, rinse them well to decrease the sodium.  When eating at a restaurant, ask that your food be prepared with less salt, or no salt if possible. WHAT FOODS CAN I EAT? Seek help from a dietitian for individual calorie needs. Grains Whole grain or whole wheat bread. Brown rice. Whole grain or whole  wheat pasta. Quinoa, bulgur, and whole grain cereals. Low-sodium cereals. Corn or whole wheat flour tortillas. Whole grain cornbread. Whole grain crackers. Low-sodium crackers. Vegetables Fresh or frozen vegetables (raw, steamed, roasted, or grilled). Low-sodium or reduced-sodium tomato and vegetable juices. Low-sodium or reduced-sodium tomato sauce and paste. Low-sodium or reduced-sodium canned vegetables.  Fruits All fresh, canned (in natural juice), or frozen fruits. Meat and Other Protein Products Ground beef (85% or leaner), grass-fed beef, or beef trimmed of fat. Skinless chicken or turkey. Ground chicken or turkey. Pork trimmed of fat. All fish and seafood. Eggs. Dried beans, peas, or lentils. Unsalted nuts   and seeds. Unsalted canned beans. Dairy Low-fat dairy products, such as skim or 1% milk, 2% or reduced-fat cheeses, low-fat ricotta or cottage cheese, or plain low-fat yogurt. Low-sodium or reduced-sodium cheeses. Fats and Oils Tub margarines without trans fats. Light or reduced-fat mayonnaise and salad dressings (reduced sodium). Avocado. Safflower, olive, or canola oils. Natural peanut or almond butter. Other Unsalted popcorn and pretzels. The items listed above may not be a complete list of recommended foods or beverages. Contact your dietitian for more options. WHAT FOODS ARE NOT RECOMMENDED? Grains White bread. White pasta. White rice. Refined cornbread. Bagels and croissants. Crackers that contain trans fat. Vegetables Creamed or fried vegetables. Vegetables in a cheese sauce. Regular canned vegetables. Regular canned tomato sauce and paste. Regular tomato and vegetable juices. Fruits Dried fruits. Canned fruit in light or heavy syrup. Fruit juice. Meat and Other Protein Products Fatty cuts of meat. Ribs, chicken wings, bacon, sausage, bologna, salami, chitterlings, fatback, hot dogs, bratwurst, and packaged luncheon meats. Salted nuts and seeds. Canned beans with  salt. Dairy Whole or 2% milk, cream, half-and-half, and cream cheese. Whole-fat or sweetened yogurt. Full-fat cheeses or blue cheese. Nondairy creamers and whipped toppings. Processed cheese, cheese spreads, or cheese curds. Condiments Onion and garlic salt, seasoned salt, table salt, and sea salt. Canned and packaged gravies. Worcestershire sauce. Tartar sauce. Barbecue sauce. Teriyaki sauce. Soy sauce, including reduced sodium. Steak sauce. Fish sauce. Oyster sauce. Cocktail sauce. Horseradish. Ketchup and mustard. Meat flavorings and tenderizers. Bouillon cubes. Hot sauce. Tabasco sauce. Marinades. Taco seasonings. Relishes. Fats and Oils Butter, stick margarine, lard, shortening, ghee, and bacon fat. Coconut, palm kernel, or palm oils. Regular salad dressings. Other Pickles and olives. Salted popcorn and pretzels. The items listed above may not be a complete list of foods and beverages to avoid. Contact your dietitian for more information. WHERE CAN I FIND MORE INFORMATION? National Heart, Lung, and Blood Institute: www.nhlbi.nih.gov/health/health-topics/topics/dash/   This information is not intended to replace advice given to you by your health care provider. Make sure you discuss any questions you have with your health care provider.   Document Released: 12/28/2010 Document Revised: 01/29/2014 Document Reviewed: 11/12/2012 Elsevier Interactive Patient Education 2016 Elsevier Inc. Hypertension Hypertension, commonly called high blood pressure, is when the force of blood pumping through your arteries is too strong. Your arteries are the blood vessels that carry blood from your heart throughout your body. A blood pressure reading consists of a higher number over a lower number, such as 110/72. The higher number (systolic) is the pressure inside your arteries when your heart pumps. The lower number (diastolic) is the pressure inside your arteries when your heart relaxes. Ideally you want your  blood pressure below 120/80. Hypertension forces your heart to work harder to pump blood. Your arteries may become narrow or stiff. Having untreated or uncontrolled hypertension can cause heart attack, stroke, kidney disease, and other problems. RISK FACTORS Some risk factors for high blood pressure are controllable. Others are not.  Risk factors you cannot control include:   Race. You may be at higher risk if you are African American.  Age. Risk increases with age.  Gender. Men are at higher risk than women before age 45 years. After age 65, women are at higher risk than men. Risk factors you can control include:  Not getting enough exercise or physical activity.  Being overweight.  Getting too much fat, sugar, calories, or salt in your diet.  Drinking too much alcohol. SIGNS AND SYMPTOMS Hypertension does   not usually cause signs or symptoms. Extremely high blood pressure (hypertensive crisis) may cause headache, anxiety, shortness of breath, and nosebleed. DIAGNOSIS To check if you have hypertension, your health care provider will measure your blood pressure while you are seated, with your arm held at the level of your heart. It should be measured at least twice using the same arm. Certain conditions can cause a difference in blood pressure between your right and left arms. A blood pressure reading that is higher than normal on one occasion does not mean that you need treatment. If it is not clear whether you have high blood pressure, you may be asked to return on a different day to have your blood pressure checked again. Or, you may be asked to monitor your blood pressure at home for 1 or more weeks. TREATMENT Treating high blood pressure includes making lifestyle changes and possibly taking medicine. Living a healthy lifestyle can help lower high blood pressure. You may need to change some of your habits. Lifestyle changes may include:  Following the DASH diet. This diet is high in  fruits, vegetables, and whole grains. It is low in salt, red meat, and added sugars.  Keep your sodium intake below 2,300 mg per day.  Getting at least 30-45 minutes of aerobic exercise at least 4 times per week.  Losing weight if necessary.  Not smoking.  Limiting alcoholic beverages.  Learning ways to reduce stress. Your health care provider may prescribe medicine if lifestyle changes are not enough to get your blood pressure under control, and if one of the following is true:  You are 18-59 years of age and your systolic blood pressure is above 140.  You are 60 years of age or older, and your systolic blood pressure is above 150.  Your diastolic blood pressure is above 90.  You have diabetes, and your systolic blood pressure is over 140 or your diastolic blood pressure is over 90.  You have kidney disease and your blood pressure is above 140/90.  You have heart disease and your blood pressure is above 140/90. Your personal target blood pressure may vary depending on your medical conditions, your age, and other factors. HOME CARE INSTRUCTIONS  Have your blood pressure rechecked as directed by your health care provider.   Take medicines only as directed by your health care provider. Follow the directions carefully. Blood pressure medicines must be taken as prescribed. The medicine does not work as well when you skip doses. Skipping doses also puts you at risk for problems.  Do not smoke.   Monitor your blood pressure at home as directed by your health care provider. SEEK MEDICAL CARE IF:   You think you are having a reaction to medicines taken.  You have recurrent headaches or feel dizzy.  You have swelling in your ankles.  You have trouble with your vision. SEEK IMMEDIATE MEDICAL CARE IF:  You develop a severe headache or confusion.  You have unusual weakness, numbness, or feel faint.  You have severe chest or abdominal pain.  You vomit repeatedly.  You  have trouble breathing. MAKE SURE YOU:   Understand these instructions.  Will watch your condition.  Will get help right away if you are not doing well or get worse.   This information is not intended to replace advice given to you by your health care provider. Make sure you discuss any questions you have with your health care provider.   Document Released: 01/08/2005 Document Revised: 05/25/2014 Document   Reviewed: 10/31/2012 Elsevier Interactive Patient Education 2016 Elsevier Inc.  

## 2015-02-21 NOTE — Telephone Encounter (Signed)
Patient verified DOB Patient informed of physical paperwork being completed and placed at the front desk for pickup. Patient stated her mother would be picking up the paperwork. Patient expressed her understanding and had no further questions.

## 2015-02-21 NOTE — Telephone Encounter (Signed)
Attempt to f/u with pt concerning psychiatry referral (ADHD), left HIPPA-compliant message to return call to [facility] from [facility] at [phone].

## 2015-02-21 NOTE — Progress Notes (Signed)
Patient here for Physical and Hep B injection  Patient denies any pain or concerns at this time.  Patient has not eaten.  Patient tolerated Hep B injection well.

## 2015-02-21 NOTE — Progress Notes (Signed)
Patient ID: [name], female   DOB: 16-Aug-1975, 40 y.o.   MRN: [id number]   [name], is a 40 y.o. female  [id number]  [id number]  DOB - 1975-09-21  Chief Complaint  Patient presents with  . Annual Exam    Hep B INJECTION      Subjective:   [name] is a 40 y.o. female with multiple medical history as listed below including diabetes mellitus, hypertension, psoriasis and morbid obesity here today for a follow up visit and for school physical examination. Patient recently got admission to [facility] for [employer], she has been requested to do a physical examination and some routine labs and immunization. Patient has no complaint today. Blood sugar is averaging 170, she recently joined a program where her coronaries are monitored remotely, exercise regimen recommended based on calorie intake. She is also reminded of her medication which has improved her compliance rate. Patient is due for hemoglobin A1c in 2 months. Patient has No headache, No chest pain, No abdominal pain - No Nausea, No new weakness tingling or numbness, No Cough - SOB. Patient claims she is taking about 18 courses at the moment and she feels that her ADHD is kicking in again, she would like a referral to psychiatrist for possible prescription of stimulants. She also needs a refill of Zovirax for genital herpes flareup prevention.  Problem  School Physical Exam  Herpes Genitalis    ALLERGIES: Allergies  Allergen Reactions  . Cephalexin Anaphylaxis and Shortness Of Breath    But can tolerate Ceftin and Amoxicillin per patient  . Latex Itching    Also burning from waist down  . Lisinopril Cough and Other (See Comments)    Cough  . Statins Other (See Comments) and Nausea And Vomiting    Back pain Back pain   . Augmentin [Amoxicillin-Pot Clavulanate] Rash and Swelling    Other reaction(s): Swelling Rash, eye swelling Rash, eye swelling  . Tea Rash    tanins  in tea     PAST MEDICAL HISTORY: Past Medical History  Diagnosis Date  . Diabetes mellitus   . Migraine   . Hypoactive thyroid   . Cancer (HCC)     cervical  . Osteoporosis   . Hypertension   . High cholesterol   . Anginal pain ([place]) 2011    "stress per Dr Pang"-occ at present from increased stress  . GERD (gastroesophageal reflux disease)   . Psoriasis     scattered throughout body- large amount- states increased with stress of surgery, followed by [name].   . Fatty liver     with gall bladder sludge by pt history/ no stones  . Mental disorder     bipolar  . Anxiety   . Ventral hernia 09/13/2011  . Depression   . Shortness of breath     with exertion/   ? adult onset asthma  . Bipolar disorder ([place])   . Family history of anesthesia complication     aggressiveness  . History of stress test 2013    non-specific angina, followed up with stress test, /w [facility], told results were WNL, no need for followup unless she was symptomatic  . Sleep apnea 01/2011     STOP BANG SCORE 6, Sleep study done at [place] [facility]   . Stroke [facility]) 2007    ? subacranoid bleed-  no defits  . Seizures ([place])     febrile seizures as a child  .  Anemia     hx of anemia during pregnancy , blood transfusion during hospitalization, for renal calculi /w sepsis   . Arthritis     ankles- recent steroid injections in both ankles, & R wrist   . Dysrhythmia     hx MVP; single episode of PAF in the setting of sepsis 06/2012 (resolved with Cardizem)  . Pneumonia     hx of   . Chronic kidney disease     stones in past, hosp. at [place], 6/14, became septic, PICC line discharged from [place] for antibiotic Rx  . Diabetes type 2, controlled ([place])   . Arrhythmia   . Arthritis   . Morbid obesity with BMI of 50.0-59.9, adult ([place])   . PONV (postoperative nausea and vomiting)     also hard to get asleep per pt; difficult IV access    MEDICATIONS AT HOME: Prior to Admission medications    Medication Sig Start Date End Date Taking? Authorizing Provider  acetaminophen (TYLENOL) 500 MG tablet Take 1,000 mg by mouth every 6 (six) hours as needed for moderate pain.    Yes Historical Provider, MD  acyclovir (ZOVIRAX) 400 MG tablet Take 1 tablet (400 mg total) by mouth 2 (two) times daily. 02/21/15  Yes Tresa Garter, MD  Biotin 800 MCG TABS Take by mouth.   Yes Historical Provider, MD  Blood Glucose Monitoring Suppl (TRUE METRIX METER) w/Device KIT USE AS INSTRUCTED 01/27/15  Yes Tresa Garter, MD  dapagliflozin propanediol (FARXIGA) 10 MG TABS tablet Take 10 mg by mouth daily. 12/31/14  Yes Tresa Garter, MD  diclofenac (VOLTAREN) 75 MG EC tablet Take twice a day as needed.  Take with food 12/31/14  Yes Carlos Levering Draper, DO  doxycycline (VIBRA-TABS) 100 MG tablet TAKE 1 TABLET TWICE A DAY 12/28/12  Yes Historical Provider, MD  glipiZIDE (GLIPIZIDE XL) 5 MG 24 hr tablet TAKE 1 TABLET BY MOUTH DAILY WITH BREAKFAST. 10/28/14  Yes Tresa Garter, MD  GlucoCom Lancets MISC As directed TID and QHS 07/09/13  Yes Calder Oblinger E Doreene Burke, MD  glucose blood (TRUE METRIX BLOOD GLUCOSE TEST) test strip Use as instructed 01/27/15  Yes Tavarion Babington E Doreene Burke, MD  ibuprofen (ADVIL,MOTRIN) 200 MG tablet Take by mouth.   Yes Historical Provider, MD  Insulin Glargine (TOUJEO SOLOSTAR) 300 UNIT/ML SOPN Inject 10 Units into the skin at bedtime. 01/05/15  Yes Tresa Garter, MD  Insulin Pen Needle 32G X 4 MM MISC Use as directed 08/11/14  Yes Lorayne Marek, MD  levothyroxine (SYNTHROID) 125 MCG tablet TAKE 1 TABLET BY MOUTH DAILY BEFORE BREAKFAST. 01/03/15  Yes Tresa Garter, MD  losartan (COZAAR) 25 MG tablet Take 1 tablet (25 mg total) by mouth daily. 12/31/14  Yes Tresa Garter, MD  metFORMIN (GLUCOPHAGE) 1000 MG tablet Take 1 tablet (1,000 mg total) by mouth 2 (two) times daily with a meal. 12/31/14  Yes Advik Weatherspoon E Doreene Burke, MD  metFORMIN (GLUCOPHAGE) 1000 MG tablet TAKE 1 TABLET BY  MOUTH TWICE A DAY WITH FOOD 02/23/13  Yes Historical Provider, MD  traMADol (ULTRAM) 50 MG tablet Take 1 tablet (50 mg total) by mouth every 6 (six) hours as needed. 12/31/14  Yes Timothy R Draper, DO  triprolidine-pseudoephedrine (APRODINE) 2.5-60 MG TABS tablet Take 1 tablet by mouth at bedtime as needed for allergies or congestion.   Yes Historical Provider, MD  ustekinumab (STELARA) 90 MG/ML SOSY injection 90 mg  day one, 90 mg week four, then 90 mg every twelve  weeks. 01/26/14  Yes Historical Provider, MD  Ustekinumab (STELARA) 90 MG/ML SOSY Inject 90 mg into the skin every 3 (three) months.    Yes Historical Provider, MD  bacitracin ophthalmic ointment Reported on 02/21/2015 11/12/12   Historical Provider, MD  fluconazole (DIFLUCAN) 200 MG tablet Take 1 tablet (200 mg total) by mouth daily. Patient not taking: Reported on 02/21/2015 01/07/15   Tresa Garter, MD  gabapentin (NEURONTIN) 300 MG capsule Reported on 02/21/2015 01/19/13   Historical Provider, MD  guaiFENesin (ROBITUSSIN) 100 MG/5ML liquid Take 200 mg by mouth every 6 (six) hours as needed for cough. Reported on 02/21/2015    Historical Provider, MD  MICONAZOLE NITRATE VAGINAL (MICONAZOLE 3) 4 % CREA Apply 2 time per day Patient not taking: Reported on 02/21/2015 12/31/14   Tresa Garter, MD  phenazopyridine (PYRIDIUM) 200 MG tablet Take 1 tablet (200 mg total) by mouth 3 (three) times daily with meals. Patient not taking: Reported on 12/30/2014 12/11/14   Kinnie Feil, MD  QUEtiapine (SEROQUEL) 100 MG tablet Reported on 02/21/2015 01/19/13   Historical Provider, MD  sertraline (ZOLOFT) 100 MG tablet Reported on 02/21/2015 01/19/13   Historical Provider, MD  sodium chloride (OCEAN) 0.65 % SOLN nasal spray Place 2 sprays into both nostrils daily as needed for congestion. Reported on 02/21/2015    Historical Provider, MD  Sodium Oxybate (XYREM) 500 MG/ML SOLN Reported on 02/21/2015 08/20/12   Historical Provider, MD     Objective:    Filed Vitals:   02/21/15 0917  BP: 137/85  Pulse: 70  Temp: 97.7 F (36.5 C)  TempSrc: Oral  Resp: 18  Height: _0  (1.676 m)  Weight: 315 lb (142.883 kg)  SpO2: 98%    Exam General appearance : Awake, alert, not in any distress. Speech Clear. Not toxic looking, morbidly obese HEENT: Atraumatic and Normocephalic, pupils equally reactive to light and accomodation Neck: supple, no JVD. No cervical lymphadenopathy.  Chest:Good air entry bilaterally, no added sounds  CVS: S1 S2 regular, no murmurs.  Abdomen: Bowel sounds present, Non tender and not distended with no gaurding, rigidity or rebound. Extremities: B/L Lower Ext shows no edema, both legs are warm to touch Neurology: Awake alert, and oriented X 3, CN II-XII intact, Non focal Skin: Mild psoriatic rashes, dry skin Data Review Lab Results  Component Value Date   HGBA1C 10.10 12/30/2014   HGBA1C 8.60 10/28/2014   HGBA1C 9.70 08/11/2014     Assessment & Plan   1. Type 2 diabetes mellitus without complication, unspecified long term insulin use status (HCC)  - Glucose (CBG) - Follow up in 2 months for HbA1C  2. Essential hypertension  We have discussed target BP range and blood pressure goal. I have advised patient to check BP regularly and to call us back or report to clinic if the numbers are consistently higher than 140/90. We discussed the importance of compliance with medical therapy and DASH diet recommended, consequences of uncontrolled hypertension discussed.   - continue current BP medications  3. Morbid obesity due to excess calories (HCC)  Aim for 30 minutes of exercise most days. Rethink what you drink. Water is great! Aim for 2-3 Carb Choices per meal (30-45 grams) +/- 1 either way  Aim for 0-15 Carbs per snack if hungry  Include protein in moderation with your meals and snacks  Consider reading food labels for Total Carbohydrate and Fat Grams of foods  Consider checking BG at alternate times per  day  Continue taking medication as directed Be mindful about how much sugar you are adding to beverages and other foods. Fruit Punch - find one with no sugar  Measure and decrease portions of carbohydrate foods  Make your plate and don't go back for seconds   4. School physical exam Physical examination performed Patient is found physically fit for academic pursuit Labs ordered  5. Herpes genitalis  - acyclovir (ZOVIRAX) 400 MG tablet; Take 1 tablet (400 mg total) by mouth 2 (two) times daily.  Dispense: 180 tablet; Refill: 3  6. Attention deficit hyperactivity disorder (ADHD), unspecified ADHD type  - Ambulatory referral to Psychiatry  Patient have been counseled extensively about nutrition and exercise  Return in about 2 months (around 04/21/2015) for Follow up HTN, Hemoglobin A1C and Follow up, DM.  The patient was given clear instructions to go to ER or return to medical center if symptoms don't improve, worsen or new problems develop. The patient verbalized understanding. The patient was told to call to get lab results if they haven't heard anything in the next week.   This note has been created with Surveyor, quantity. Any transcriptional errors are unintentional.    Angelica Chessman, MD, [place], [place]arilyn Cota, [place] and [facility], [place]   02/21/2015, 9:49 AM

## 2015-02-28 ENCOUNTER — Other Ambulatory Visit: Payer: Self-pay | Admitting: Sports Medicine

## 2015-02-28 MED FILL — ?DICLOFENAC SOD DR 75 MG TA: 75 | 30 days supply | Qty: 60 | Fill #0

## 2015-02-28 MED FILL — glipiZIDE XL 5 MG TB24: 5 | 30 days supply | Qty: 30 | Fill #4

## 2015-02-28 MED FILL — metFORMIN HCL 1000 MG TABS: 1000 | 30 days supply | Qty: 60 | Fill #0

## 2015-02-28 MED FILL — LEVOTHYROXINE 125 MCG TAB: 125 | 30 days supply | Qty: 30 | Fill #4

## 2015-02-28 MED FILL — LOSARTAN POTASSIUM 25 MG TA: 25 | 30 days supply | Qty: 30 | Fill #4

## 2015-02-28 MED FILL — **FARXIGA 10 MG TABLET: 10 MG | 30 days supply | Qty: 30 | Fill #2

## 2015-02-28 NOTE — Telephone Encounter (Signed)
Did you want to refill this?  We last refilled on 12/31/14

## 2015-03-03 ENCOUNTER — Encounter: Payer: No Typology Code available for payment source | Admitting: Internal Medicine

## 2015-03-25 MED FILL — ?METFORMIN HCL 1,000 MG TAB: 1000 | 30 days supply | Qty: 60 | Fill #1

## 2015-03-25 MED FILL — LOSARTAN POTASSIUM 25 MG TA: 25 | 30 days supply | Qty: 30 | Fill #5

## 2015-03-25 MED FILL — ?DICLOFENAC SOD DR 75 MG TA: 75 | 30 days supply | Qty: 60 | Fill #1

## 2015-03-25 MED FILL — LEVOTHYROXINE 125 MCG TAB: 125 | 30 days supply | Qty: 30 | Fill #5

## 2015-03-25 MED FILL — glipiZIDE XL 5 MG TB24: 5 | 30 days supply | Qty: 30 | Fill #5

## 2015-03-25 MED FILL — ACYCLOVIR 400 MG TABLET: 400 | 30 days supply | Qty: 60 | Fill #0

## 2015-03-25 MED FILL — **FARXIGA 10 MG TABLET: 10 MG | 30 days supply | Qty: 30 | Fill #3

## 2015-04-04 ENCOUNTER — Ambulatory Visit: Payer: BLUE CROSS/BLUE SHIELD | Admitting: Internal Medicine

## 2015-04-20 ENCOUNTER — Telehealth: Payer: Self-pay | Admitting: Clinical

## 2015-04-20 NOTE — Telephone Encounter (Signed)
F/u with pt, she has begun ADHD treatment at [facility] for [place] in [place], and says thank you for the information concerning ADHD services; Ms. Kennedy is encouraged to contact [facility] at [phone] if transportation continues to be an issue for future appointments, as we want to make sure she is able to come into appointments for care. Schedulers at [facility] will contact pt later this afternoon to set up next appointment time, pt prefers Fridays after noon, if possible.

## 2015-04-21 MED FILL — ULTICARE PEN NDL 4MM 32G: 32G X 4 MM | 100 days supply | Qty: 100 | Fill #2 | Status: TO

## 2015-04-28 ENCOUNTER — Encounter: Payer: Self-pay | Admitting: Internal Medicine

## 2015-04-28 ENCOUNTER — Ambulatory Visit: Payer: BLUE CROSS/BLUE SHIELD | Attending: Internal Medicine | Admitting: Internal Medicine

## 2015-04-28 VITALS — BP 133/83 | HR 85 | Temp 97.4°F | Resp 18 | Ht 66.0 in | Wt 313.0 lb

## 2015-04-28 DIAGNOSIS — L409 Psoriasis, unspecified: Secondary | ICD-10-CM | POA: Insufficient documentation

## 2015-04-28 DIAGNOSIS — G4730 Sleep apnea, unspecified: Secondary | ICD-10-CM | POA: Insufficient documentation

## 2015-04-28 DIAGNOSIS — N189 Chronic kidney disease, unspecified: Secondary | ICD-10-CM | POA: Insufficient documentation

## 2015-04-28 DIAGNOSIS — R109 Unspecified abdominal pain: Secondary | ICD-10-CM | POA: Insufficient documentation

## 2015-04-28 DIAGNOSIS — K219 Gastro-esophageal reflux disease without esophagitis: Secondary | ICD-10-CM | POA: Insufficient documentation

## 2015-04-28 DIAGNOSIS — Z888 Allergy status to other drugs, medicaments and biological substances status: Secondary | ICD-10-CM | POA: Diagnosis not present

## 2015-04-28 DIAGNOSIS — I10 Essential (primary) hypertension: Secondary | ICD-10-CM | POA: Diagnosis present

## 2015-04-28 DIAGNOSIS — Z8673 Personal history of transient ischemic attack (TIA), and cerebral infarction without residual deficits: Secondary | ICD-10-CM | POA: Diagnosis not present

## 2015-04-28 DIAGNOSIS — I480 Paroxysmal atrial fibrillation: Secondary | ICD-10-CM | POA: Diagnosis not present

## 2015-04-28 DIAGNOSIS — Z794 Long term (current) use of insulin: Secondary | ICD-10-CM | POA: Diagnosis not present

## 2015-04-28 DIAGNOSIS — Z79899 Other long term (current) drug therapy: Secondary | ICD-10-CM | POA: Diagnosis not present

## 2015-04-28 DIAGNOSIS — G43909 Migraine, unspecified, not intractable, without status migrainosus: Secondary | ICD-10-CM | POA: Diagnosis not present

## 2015-04-28 DIAGNOSIS — F319 Bipolar disorder, unspecified: Secondary | ICD-10-CM | POA: Insufficient documentation

## 2015-04-28 DIAGNOSIS — F419 Anxiety disorder, unspecified: Secondary | ICD-10-CM | POA: Diagnosis not present

## 2015-04-28 DIAGNOSIS — E039 Hypothyroidism, unspecified: Secondary | ICD-10-CM | POA: Diagnosis not present

## 2015-04-28 DIAGNOSIS — Z880 Allergy status to penicillin: Secondary | ICD-10-CM | POA: Insufficient documentation

## 2015-04-28 DIAGNOSIS — Z87442 Personal history of urinary calculi: Secondary | ICD-10-CM | POA: Diagnosis not present

## 2015-04-28 DIAGNOSIS — M1990 Unspecified osteoarthritis, unspecified site: Secondary | ICD-10-CM | POA: Diagnosis not present

## 2015-04-28 DIAGNOSIS — E119 Type 2 diabetes mellitus without complications: Secondary | ICD-10-CM | POA: Insufficient documentation

## 2015-04-28 DIAGNOSIS — E7800 Pure hypercholesterolemia, unspecified: Secondary | ICD-10-CM | POA: Diagnosis not present

## 2015-04-28 DIAGNOSIS — J069 Acute upper respiratory infection, unspecified: Secondary | ICD-10-CM | POA: Insufficient documentation

## 2015-04-28 DIAGNOSIS — Z6841 Body Mass Index (BMI) 40.0 and over, adult: Secondary | ICD-10-CM | POA: Diagnosis not present

## 2015-04-28 DIAGNOSIS — R490 Dysphonia: Secondary | ICD-10-CM | POA: Diagnosis not present

## 2015-04-28 DIAGNOSIS — I129 Hypertensive chronic kidney disease with stage 1 through stage 4 chronic kidney disease, or unspecified chronic kidney disease: Secondary | ICD-10-CM | POA: Diagnosis present

## 2015-04-28 DIAGNOSIS — J45909 Unspecified asthma, uncomplicated: Secondary | ICD-10-CM | POA: Insufficient documentation

## 2015-04-28 LAB — POCT GLYCOSYLATED HEMOGLOBIN (HGB A1C): HEMOGLOBIN A1C: 7

## 2015-04-28 LAB — GLUCOSE, POCT (MANUAL RESULT ENTRY): POC GLUCOSE: 199 mg/dL — AB (ref 70–99)

## 2015-04-28 MED ORDER — GABAPENTIN 300 MG PO CAPS: 300.0000 mg | ORAL_CAPSULE | Freq: Three times a day (TID) | ORAL | Status: AC

## 2015-04-28 MED ORDER — LEVOTHYROXINE SODIUM 125 MCG PO TABS: ORAL_TABLET | ORAL | Status: DC

## 2015-04-28 MED ORDER — METFORMIN HCL 1000 MG PO TABS: 1000.0000 mg | ORAL_TABLET | Freq: Two times a day (BID) | ORAL | Status: DC

## 2015-04-28 MED ORDER — GLIPIZIDE ER 5 MG PO TB24: ORAL_TABLET | ORAL | Status: DC

## 2015-04-28 MED ORDER — LOSARTAN POTASSIUM 25 MG PO TABS: 25.0000 mg | ORAL_TABLET | Freq: Every day | ORAL | Status: DC

## 2015-04-28 MED ORDER — INSULIN GLARGINE 300 UNIT/ML ~~LOC~~ SOPN: 10.0000 [IU] | PEN_INJECTOR | Freq: Every day | SUBCUTANEOUS | Status: AC

## 2015-04-28 MED ORDER — DAPAGLIFLOZIN PROPANEDIOL 10 MG PO TABS: 10.0000 mg | ORAL_TABLET | Freq: Every day | ORAL | Status: AC

## 2015-04-28 MED FILL — $TOUJEO SOLOSTAR 300 UNIT/M: 300 | 135 days supply | Qty: 3 | Fill #0

## 2015-04-28 MED FILL — ACYCLOVIR 400 MG TABLET: 400 | 30 days supply | Qty: 60 | Fill #1

## 2015-04-28 MED FILL — glipiZIDE XL 5 MG TB24: 5 | 30 days supply | Qty: 30 | Fill #6

## 2015-04-28 MED FILL — LEVOTHYROXINE 125 MCG TAB: 125 | 30 days supply | Qty: 30 | Fill #6

## 2015-04-28 MED FILL — LOSARTAN POTASSIUM 25 MG TA: 25 | 30 days supply | Qty: 30 | Fill #6

## 2015-04-28 MED FILL — ?DICLOFENAC SOD DR 75 MG TA: 75 | 30 days supply | Qty: 60 | Fill #2

## 2015-04-28 MED FILL — ?METFORMIN HCL 1,000 MG TAB: 1000 | 30 days supply | Qty: 60 | Fill #2

## 2015-04-28 NOTE — Progress Notes (Signed)
Patient ID: [name], female   DOB: 03-26-1975, 40 y.o.   MRN: [id number]   [name], is a 40 y.o. female  [id number]  [id number]  DOB - 1975-10-20  Chief Complaint  Patient presents with  . Follow-up    HTN + DM        Subjective:   [name] is a 40 y.o. female with multiple medical history as listed below here today for a follow up visit and medication refills. Patient is currently being treated for upper respiratory tract infection with Levaquin and cough syrup, she is getting better but still has a hoarse voice. She has no new complaint today. She needs her medications refilled. She has regular follow-up with her specialists. She's compliant with all her medications, she reports no side effects. Patient has No headache, No chest pain, No abdominal pain - No Nausea, No new weakness tingling or numbness, No Cough - SOB. Patient claims she used to have kidney stones and lately she has been having increasing pain both kidney areas, she is requesting ultrasound to make sure she does not have any infection. She denies any hematuria, no other urinary symptom. No nausea or vomiting.  Problem  Flank Pain    ALLERGIES: Allergies  Allergen Reactions  . Cephalexin Anaphylaxis and Shortness Of Breath    But can tolerate Ceftin and Amoxicillin per patient  . Latex Itching    Also burning from waist down  . Lisinopril Cough and Other (See Comments)    Cough  . Statins Other (See Comments) and Nausea And Vomiting    Back pain Back pain   . Augmentin [Amoxicillin-Pot Clavulanate] Rash and Swelling    Other reaction(s): Swelling Rash, eye swelling Rash, eye swelling  . Tea Rash    tanins in tea     PAST MEDICAL HISTORY: Past Medical History  Diagnosis Date  . Diabetes mellitus   . Migraine   . Hypoactive thyroid   . Cancer (HCC)     cervical  . Osteoporosis   . Hypertension   . High cholesterol   . Anginal pain ([place]) 2011    "stress per Dr  Pang"-occ at present from increased stress  . GERD (gastroesophageal reflux disease)   . Psoriasis     scattered throughout body- large amount- states increased with stress of surgery, followed by [name].   . Fatty liver     with gall bladder sludge by pt history/ no stones  . Mental disorder     bipolar  . Anxiety   . Ventral hernia 09/13/2011  . Depression   . Shortness of breath     with exertion/   ? adult onset asthma  . Bipolar disorder ([place])   . Family history of anesthesia complication     aggressiveness  . History of stress test 2013    non-specific angina, followed up with stress test, /w [facility], told results were WNL, no need for followup unless she was symptomatic  . Sleep apnea 01/2011     STOP BANG SCORE 6, Sleep study done at [place] [facility]   . Stroke [facility]) 2007    ? subacranoid bleed-  no defits  . Seizures ([place])     febrile seizures as a child  . Anemia     hx of anemia during pregnancy , blood transfusion during hospitalization, for renal calculi /w sepsis   . Arthritis     ankles- recent steroid injections in both ankles, &  R wrist   . Dysrhythmia     hx MVP; single episode of PAF in the setting of sepsis 06/2012 (resolved with Cardizem)  . Pneumonia     hx of   . Chronic kidney disease     stones in past, hosp. at [place], 6/14, became septic, PICC line discharged from [place] for antibiotic Rx  . Diabetes type 2, controlled ([place])   . Arrhythmia   . Arthritis   . Morbid obesity with BMI of 50.0-59.9, adult ([place])   . PONV (postoperative nausea and vomiting)     also hard to get asleep per pt; difficult IV access    MEDICATIONS AT HOME: Prior to Admission medications   Medication Sig Start Date End Date Taking? Authorizing Provider  acyclovir (ZOVIRAX) 400 MG tablet Take 1 tablet (400 mg total) by mouth 2 (two) times daily. 02/21/15  Yes Tresa Garter, MD  amphetamine-dextroamphetamine (ADDERALL) 20 MG tablet Take 20 mg by  mouth 2 (two) times daily.   Yes Historical Provider, MD  Biotin 800 MCG TABS Take by mouth.   Yes Historical Provider, MD  Blood Glucose Monitoring Suppl (TRUE METRIX METER) w/Device KIT USE AS INSTRUCTED 01/27/15  Yes Tresa Garter, MD  dapagliflozin propanediol (FARXIGA) 10 MG TABS tablet Take 10 mg by mouth daily. 04/28/15  Yes Tresa Garter, MD  gabapentin (NEURONTIN) 300 MG capsule Take 1 capsule (300 mg total) by mouth 3 (three) times daily. 04/28/15  Yes Sharnice Bosler E Doreene Burke, MD  glipiZIDE (GLIPIZIDE XL) 5 MG 24 hr tablet TAKE 1 TABLET BY MOUTH DAILY WITH BREAKFAST. 04/28/15  Yes Tresa Garter, MD  GlucoCom Lancets MISC As directed TID and QHS 07/09/13  Yes Rahil Passey E Malakhi [place], MD  glucose blood (TRUE METRIX BLOOD GLUCOSE TEST) test strip Use as instructed 01/27/15  Yes Yue Flanigan E Doreene Burke, MD  Insulin Glargine (TOUJEO SOLOSTAR) 300 UNIT/ML SOPN Inject 10 Units into the skin at bedtime. 04/28/15  Yes Tresa Garter, MD  Insulin Pen Needle 32G X 4 MM MISC Use as directed 08/11/14  Yes Deepak Advani, MD  levofloxacin (LEVAQUIN) 750 MG tablet Take 750 mg by mouth daily.   Yes Historical Provider, MD  levothyroxine (SYNTHROID) 125 MCG tablet TAKE 1 TABLET BY MOUTH DAILY BEFORE BREAKFAST. 04/28/15  Yes Tresa Garter, MD  losartan (COZAAR) 25 MG tablet Take 1 tablet (25 mg total) by mouth daily. 04/28/15  Yes Tresa Garter, MD  metFORMIN (GLUCOPHAGE) 1000 MG tablet Take 1 tablet (1,000 mg total) by mouth 2 (two) times daily with a meal. 04/28/15  Yes Tresa Garter, MD  MICONAZOLE NITRATE VAGINAL (MICONAZOLE 3) 4 % CREA Apply 2 time per day 12/31/14  Yes Tresa Garter, MD  phenazopyridine (PYRIDIUM) 200 MG tablet Take 1 tablet (200 mg total) by mouth 3 (three) times daily with meals. 12/11/14  Yes Kinnie Feil, MD  QUEtiapine (SEROQUEL) 100 MG tablet Reported on 02/21/2015 01/19/13  Yes Historical Provider, MD  sertraline (ZOLOFT) 100 MG tablet Reported on 02/21/2015  01/19/13  Yes Historical Provider, MD  sodium chloride (OCEAN) 0.65 % SOLN nasal spray Place 2 sprays into both nostrils daily as needed for congestion. Reported on 02/21/2015   Yes Historical Provider, MD  Sodium Oxybate (XYREM) 500 MG/ML SOLN Reported on 02/21/2015 08/20/12  Yes Historical Provider, MD  triprolidine-pseudoephedrine (APRODINE) 2.5-60 MG TABS tablet Take 1 tablet by mouth at bedtime as needed for allergies or congestion.   Yes Historical Provider, MD  ustekinumab Delsa Grana)  90 MG/ML SOSY injection 90 mg [place] day one, 90 mg week four, then 90 mg every twelve weeks. 01/26/14  Yes Historical Provider, MD  Ustekinumab (STELARA) 90 MG/ML SOSY Inject 90 mg into the skin every 3 (three) months.    Yes Historical Provider, MD  guaiFENesin (ROBITUSSIN) 100 MG/5ML liquid Take 200 mg by mouth every 6 (six) hours as needed for cough. Reported on 04/28/2015    Historical Provider, MD     Objective:   Filed Vitals:   04/28/15 1706  BP: 133/83  Pulse: 85  Temp: 97.4 F (36.3 C)  TempSrc: Oral  Resp: 18  Height: 5' 6"  (1.676 m)  Weight: 313 lb (141.976 kg)  SpO2: 98%    Exam General appearance : Awake, alert, not in any distress. Speech Clear. Not toxic looking, morbidly obese HEENT: Atraumatic and Normocephalic, pupils equally reactive to light and accomodation Neck: supple, no JVD. No cervical lymphadenopathy.  Chest:Good air entry bilaterally, no added sounds  CVS: S1 S2 regular, no murmurs.  Abdomen: Renal angle tenderness bilaterally. Bowel sounds present, Non tender and not distended with no gaurding, rigidity or rebound. Extremities: B/L Lower Ext shows no edema, both legs are warm to touch Neurology: Awake alert, and oriented X 3, CN II-XII intact, Non focal Skin:No Rash  Data Review Lab Results  Component Value Date   HGBA1C 7.0 04/28/2015   HGBA1C 10.10 12/30/2014   HGBA1C 8.60 10/28/2014     Assessment & Plan   1. Type 2 diabetes mellitus without complication, with  long-term current use of insulin (HCC)  - POCT A1C - Glucose (CBG) - dapagliflozin propanediol (FARXIGA) 10 MG TABS tablet; Take 10 mg by mouth daily.  Dispense: 90 tablet; Refill: 3 - gabapentin (NEURONTIN) 300 MG capsule; Take 1 capsule (300 mg total) by mouth 3 (three) times daily.  Dispense: 270 capsule; Refill: 3 - glipiZIDE (GLIPIZIDE XL) 5 MG 24 hr tablet; TAKE 1 TABLET BY MOUTH DAILY WITH BREAKFAST.  Dispense: 90 tablet; Refill: 3 - Insulin Glargine (TOUJEO SOLOSTAR) 300 UNIT/ML SOPN; Inject 10 Units into the skin at bedtime.  Dispense: 3 mL; Refill: 3 - metFORMIN (GLUCOPHAGE) 1000 MG tablet; Take 1 tablet (1,000 mg total) by mouth 2 (two) times daily with a meal.  Dispense: 180 tablet; Refill: 3  Aim for 30 minutes of exercise most days. Rethink what you drink. Water is great! Aim for 2-3 Carb Choices per meal (30-45 grams) +/- 1 either way  Aim for 0-15 Carbs per snack if hungry  Include protein in moderation with your meals and snacks  Consider reading food labels for Total Carbohydrate and Fat Grams of foods  Consider checking BG at alternate times per day  Continue taking medication as directed Be mindful about how much sugar you are adding to beverages and other foods. Fruit Punch - find one with no sugar  Measure and decrease portions of carbohydrate foods  Make your plate and don't go back for seconds  2. Hypothyroidism, unspecified hypothyroidism type  - levothyroxine (SYNTHROID) 125 MCG tablet; TAKE 1 TABLET BY MOUTH DAILY BEFORE BREAKFAST.  Dispense: 90 tablet; Refill: 3  3. Essential hypertension  - losartan (COZAAR) 25 MG tablet; Take 1 tablet (25 mg total) by mouth daily.  Dispense: 90 tablet; Refill: 3  We have discussed target BP range and blood pressure goal. I have advised patient to check BP regularly and to call us back or report to clinic if the numbers are consistently higher than 140/90. We discussed  the importance of compliance with medical therapy and  DASH diet recommended, consequences of uncontrolled hypertension discussed.   - continue current BP medications  4. Flank pain  - US Renal; Future  Patient have been counseled extensively about nutrition and exercise  Return in about 3 months (around 07/28/2015) for Hemoglobin A1C and Follow up, DM, Follow up HTN, Follow up Pain and comorbidities.  The patient was given clear instructions to go to ER or return to medical center if symptoms don't improve, worsen or new problems develop. The patient verbalized understanding. The patient was told to call to get lab results if they haven't heard anything in the next week.   This note has been created with Surveyor, quantity. Any transcriptional errors are unintentional.    Angelica Chessman, MD, [place], [place], [place], [place] and [place], [place]   04/28/2015, 5:30 PM

## 2015-04-28 NOTE — Progress Notes (Signed)
Patient is here for FU HTN + DM  Patient complains of chronic hoarseness. Patient is on levaquin currently for 10 days.  Patient ate lunch around 2:00pm.

## 2015-04-28 NOTE — Patient Instructions (Signed)
Diabetes and Exercise Exercising regularly is important. It is not just about losing weight. It has many health benefits, such as:  Improving your overall fitness, flexibility, and endurance.  Increasing your bone density.  Helping with weight control.  Decreasing your body fat.  Increasing your muscle strength.  Reducing stress and tension.  Improving your overall health. People with diabetes who exercise gain additional benefits because exercise:  Reduces appetite.  Improves the body's use of blood sugar (glucose).  Helps lower or control blood glucose.  Decreases blood pressure.  Helps control blood lipids (such as cholesterol and triglycerides).  Improves the body's use of the hormone insulin by:  Increasing the body's insulin sensitivity.  Reducing the body's insulin needs.  Decreases the risk for heart disease because exercising:  Lowers cholesterol and triglycerides levels.  Increases the levels of good cholesterol (such as high-density lipoproteins [HDL]) in the body.  Lowers blood glucose levels. YOUR ACTIVITY PLAN  Choose an activity that you enjoy, and set realistic goals. To exercise safely, you should begin practicing any new physical activity slowly, and gradually increase the intensity of the exercise over time. Your health care provider or diabetes educator can help create an activity plan that works for you. General recommendations include:  Encouraging children to engage in at least 60 minutes of physical activity each day.  Stretching and performing strength training exercises, such as yoga or weight lifting, at least 2 times per week.  Performing a total of at least 150 minutes of moderate-intensity exercise each week, such as brisk walking or water aerobics.  Exercising at least 3 days per week, making sure you allow no more than 2 consecutive days to pass without exercising.  Avoiding long periods of inactivity (90 minutes or more). When you  have to spend an extended period of time sitting down, take frequent breaks to walk or stretch. RECOMMENDATIONS FOR EXERCISING WITH TYPE 1 OR TYPE 2 DIABETES   Check your blood glucose before exercising. If blood glucose levels are greater than 240 mg/dL, check for urine ketones. Do not exercise if ketones are present.  Avoid injecting insulin into areas of the body that are going to be exercised. For example, avoid injecting insulin into:  The arms when playing tennis.  The legs when jogging.  Keep a record of:  Food intake before and after you exercise.  Expected peak times of insulin action.  Blood glucose levels before and after you exercise.  The type and amount of exercise you have done.  Review your records with your health care provider. Your health care provider will help you to develop guidelines for adjusting food intake and insulin amounts before and after exercising.  If you take insulin or oral hypoglycemic agents, watch for signs and symptoms of hypoglycemia. They include:  Dizziness.  Shaking.  Sweating.  Chills.  Confusion.  Drink plenty of water while you exercise to prevent dehydration or heat stroke. Body water is lost during exercise and must be replaced.  Talk to your health care provider before starting an exercise program to make sure it is safe for you. Remember, almost any type of activity is better than none.   This information is not intended to replace advice given to you by your health care provider. Make sure you discuss any questions you have with your health care provider.   Document Released: 03/31/2003 Document Revised: 05/25/2014 Document Reviewed: 06/17/2012 Elsevier Interactive Patient Education 2016 Elsevier Inc. Basic Carbohydrate Counting for Diabetes Mellitus Carbohydrate counting   is a method for keeping track of the amount of carbohydrates you eat. Eating carbohydrates naturally increases the level of sugar (glucose) in your  blood, so it is important for you to know the amount that is okay for you to have in every meal. Carbohydrate counting helps keep the level of glucose in your blood within normal limits. The amount of carbohydrates allowed is different for every person. A dietitian can help you calculate the amount that is right for you. Once you know the amount of carbohydrates you can have, you can count the carbohydrates in the foods you want to eat. Carbohydrates are found in the following foods:  Grains, such as breads and cereals.  Dried beans and soy products.  Starchy vegetables, such as potatoes, peas, and corn.  Fruit and fruit juices.  Milk and yogurt.  Sweets and snack foods, such as cake, cookies, candy, chips, soft drinks, and fruit drinks. CARBOHYDRATE COUNTING There are two ways to count the carbohydrates in your food. You can use either of the methods or a combination of both. Reading the "Nutrition Facts" on Packaged Food The "Nutrition Facts" is an area that is included on the labels of almost all packaged food and beverages in the [place]. It includes the serving size of that food or beverage and information about the nutrients in each serving of the food, including the grams (g) of carbohydrate per serving.  Decide the number of servings of this food or beverage that you will be able to eat or drink. Multiply that number of servings by the number of grams of carbohydrate that is listed on the label for that serving. The total will be the amount of carbohydrates you will be having when you eat or drink this food or beverage. Learning Standard Serving Sizes of Food When you eat food that is not packaged or does not include "Nutrition Facts" on the label, you need to measure the servings in order to count the amount of carbohydrates.A serving of most carbohydrate-rich foods contains about 15 g of carbohydrates. The following list includes serving sizes of carbohydrate-rich foods that  provide 15 g ofcarbohydrate per serving:   1 slice of bread (1 oz) or 1 six-inch tortilla.    of a hamburger bun or English muffin.  4-6 crackers.   cup unsweetened dry cereal.    cup hot cereal.   cup rice or pasta.    cup mashed potatoes or  of a large baked potato.  1 cup fresh fruit or one small piece of fruit.    cup canned or frozen fruit or fruit juice.  1 cup milk.   cup plain fat-free yogurt or yogurt sweetened with artificial sweeteners.   cup cooked dried beans or starchy vegetable, such as peas, corn, or potatoes.  Decide the number of standard-size servings that you will eat. Multiply that number of servings by 15 (the grams of carbohydrates in that serving). For example, if you eat 2 cups of strawberries, you will have eaten 2 servings and 30 g of carbohydrates (2 servings x 15 g = 30 g). For foods such as soups and casseroles, in which more than one food is mixed in, you will need to count the carbohydrates in each food that is included. EXAMPLE OF CARBOHYDRATE COUNTING Sample Dinner  3 oz chicken breast.   cup of brown rice.   cup of corn.  1 cup milk.   1 cup strawberries with sugar-free whipped topping.  Carbohydrate Calculation Step   1: Identify the foods that contain carbohydrates:   Rice.   Corn.   Milk.   Strawberries. Step 2:Calculate the number of servings eaten of each:   2 servings of rice.   1 serving of corn.   1 serving of milk.   1 serving of strawberries. Step 3: Multiply each of those number of servings by 15 g:   2 servings of rice x 15 g = 30 g.   1 serving of corn x 15 g = 15 g.   1 serving of milk x 15 g = 15 g.   1 serving of strawberries x 15 g = 15 g. Step 4: Add together all of the amounts to find the total grams of carbohydrates eaten: 30 g + 15 g + 15 g + 15 g = 75 g.   This information is not intended to replace advice given to you by your health care provider. Make sure you  discuss any questions you have with your health care provider.   Document Released: 01/08/2005 Document Revised: 01/29/2014 Document Reviewed: 12/05/2012 Elsevier Interactive Patient Education 2016 Elsevier Inc.  

## 2015-04-29 MED FILL — **FARXIGA 10 MG TABLET: 10 MG | 30 days supply | Qty: 30 | Fill #0

## 2015-04-29 MED FILL — GABAPENTIN 300 MG CAPSULE: 300 | 30 days supply | Qty: 90 | Fill #0

## 2015-04-30 ENCOUNTER — Encounter: Payer: Self-pay | Admitting: Internal Medicine

## 2015-05-09 ENCOUNTER — Ambulatory Visit (HOSPITAL_COMMUNITY): Payer: BLUE CROSS/BLUE SHIELD

## 2015-05-17 ENCOUNTER — Encounter: Payer: Self-pay | Admitting: Internal Medicine

## 2015-05-18 ENCOUNTER — Ambulatory Visit (HOSPITAL_COMMUNITY)
Admission: RE | Admit: 2015-05-18 | Discharge: 2015-05-18 | Disposition: A | Discharge status: Home / Self Care | Payer: BLUE CROSS/BLUE SHIELD | Source: Ambulatory Visit | Attending: Internal Medicine | Admitting: Internal Medicine

## 2015-05-18 DIAGNOSIS — R109 Unspecified abdominal pain: Secondary | ICD-10-CM

## 2015-05-27 ENCOUNTER — Other Ambulatory Visit: Payer: Self-pay | Admitting: Sports Medicine

## 2015-05-27 MED FILL — LEVOTHYROXINE 125 MCG TAB: 125 | 30 days supply | Qty: 30 | Fill #7

## 2015-05-27 MED FILL — ?METFORMIN HCL 1,000 MG TAB: 1000 | 30 days supply | Qty: 60 | Fill #3

## 2015-05-27 MED FILL — LOSARTAN POTASSIUM 25 MG TA: 25 | 30 days supply | Qty: 30 | Fill #7

## 2015-05-27 MED FILL — ?FARXIGA 10 MG TABLET: 10 | 30 days supply | Qty: 30 | Fill #1

## 2015-05-27 MED FILL — ACYCLOVIR 400 MG TABLET: 400 | 30 days supply | Qty: 60 | Fill #2

## 2015-05-27 MED FILL — glipiZIDE XL 5 MG TB24: 5 | 30 days supply | Qty: 30 | Fill #7

## 2015-05-30 MED FILL — ?DICLOFENAC SOD DR 75 MG TA: 75 | 30 days supply | Qty: 60 | Fill #0 | Status: TO

## 2015-06-22 ENCOUNTER — Encounter: Payer: Self-pay | Admitting: Internal Medicine

## 2015-06-28 ENCOUNTER — Telehealth: Payer: Self-pay | Admitting: Internal Medicine

## 2015-06-28 ENCOUNTER — Telehealth: Payer: Self-pay | Admitting: *Deleted

## 2015-06-28 NOTE — Telephone Encounter (Signed)
MA called close to 4:00pm. Medical Assistant left message on patient's home and cell voicemail. Voicemail states to give a call back to [place] with [facility] at [phone].

## 2015-06-28 NOTE — Telephone Encounter (Signed)
Pt. Returned call. Pt. Stated she is in class from 8 in the morning to 3:30 p.m. Please f/u

## 2015-06-28 NOTE — Telephone Encounter (Signed)
-----   Message from Tresa Garter, MD sent at 05/18/2015 10:23 AM EDT ----- Please inform patient that Ultrasound of the kidneys is normal. No abnormal findings in both kidneys.

## 2015-06-28 NOTE — Telephone Encounter (Signed)
Medical Assistant left message on patient's home and cell voicemail. Voicemail states to give a call back to Jayvyn Haselton with [facility] at [phone].  

## 2015-07-01 MED FILL — ACYCLOVIR 400 MG TABLET: 400 | 30 days supply | Qty: 60 | Fill #3

## 2015-07-01 MED FILL — LOSARTAN POTASSIUM 25 MG TA: 25 | 30 days supply | Qty: 30 | Fill #8

## 2015-07-01 MED FILL — !FARXIGA 10MG TABLET: 10 | 27 days supply | Qty: 27 | Fill #2

## 2015-07-01 MED FILL — LEVOTHYROXINE 125 MCG TAB: 125 | 30 days supply | Qty: 30 | Fill #8

## 2015-07-01 MED FILL — ?DICLOFENAC SOD DR 75 MG TA: 75 | 30 days supply | Qty: 60 | Fill #1 | Status: TO

## 2015-07-01 MED FILL — glipiZIDE XL 5 MG TB24: 5 | 30 days supply | Qty: 30 | Fill #8

## 2015-07-01 MED FILL — ?METFORMIN HCL 1,000 MG TAB: 1000 | 30 days supply | Qty: 60 | Fill #4

## 2015-07-29 MED FILL — DICLOFENAC SOD DR 75 MG TAB: 75 | 30 days supply | Qty: 60 | Fill #2 | Status: TO

## 2015-07-29 MED FILL — metFORMIN HCL 1000 MG TABS: 1000 | 30 days supply | Qty: 60 | Fill #5

## 2015-07-29 MED FILL — ACYCLOVIR 400 MG TABLET: 400 | 30 days supply | Qty: 60 | Fill #4

## 2015-07-29 MED FILL — ?LEVOTHYROXINE 125 MCG TABL: 125 | 30 days supply | Qty: 30 | Fill #9

## 2015-07-29 MED FILL — LOSARTAN POTASSIUM 25 MG TA: 25 | 30 days supply | Qty: 30 | Fill #9

## 2015-07-29 MED FILL — !FARXIGA 10MG TABLET: 10 | 27 days supply | Qty: 27 | Fill #3

## 2015-07-29 MED FILL — glipiZIDE XL 5 MG TB24: 5 | 30 days supply | Qty: 30 | Fill #9

## 2015-07-30 ENCOUNTER — Encounter: Payer: Self-pay | Admitting: Internal Medicine

## 2015-08-26 MED FILL — LEVOTHYROXINE 125 MCG TAB: 125 | 30 days supply | Qty: 30 | Fill #10 | Status: TO

## 2015-08-26 MED FILL — LOSARTAN POTASSIUM 25 MG TA: 25 | 30 days supply | Qty: 30 | Fill #10 | Status: TO

## 2015-08-26 MED FILL — ACYCLOVIR 400 MG TABLET: 400 | 30 days supply | Qty: 60 | Fill #5 | Status: TO

## 2015-08-26 MED FILL — glipiZIDE XL 5 MG TB24: 5 | 30 days supply | Qty: 30 | Fill #10 | Status: TO

## 2015-08-26 MED FILL — metFORMIN HCL 1000 MG TABS: 1000 | 30 days supply | Qty: 60 | Fill #0 | Status: TO

## 2015-09-02 ENCOUNTER — Other Ambulatory Visit: Payer: Self-pay | Admitting: Surgery

## 2015-09-02 DIAGNOSIS — R1011 Right upper quadrant pain: Secondary | ICD-10-CM | POA: Insufficient documentation

## 2015-09-05 ENCOUNTER — Ambulatory Visit
Admission: RE | Admit: 2015-09-05 | Discharge: 2015-09-05 | Disposition: A | Discharge status: Home / Self Care | Payer: BLUE CROSS/BLUE SHIELD | Source: Ambulatory Visit | Attending: Surgery | Admitting: Surgery

## 2015-09-05 DIAGNOSIS — R1011 Right upper quadrant pain: Secondary | ICD-10-CM

## 2015-09-05 MED ORDER — IOPAMIDOL (ISOVUE-300) INJECTION 61%
100.0000 mL | Freq: Once | INTRAVENOUS | Status: AC | PRN
  Administered 2015-09-05: 100 mL via INTRAVENOUS

## 2015-09-05 NOTE — Progress Notes (Signed)
Please call the patient and let them know that the scan did not show any problems with the mesh or the gallbladder.  If she continues to have symptoms, she should let us know and we can order a HIDA scan to better evaluate her gallbladder.  However, at this time, there is no need for any type of surgery.

## 2015-09-08 ENCOUNTER — Telehealth: Payer: Self-pay | Admitting: Internal Medicine

## 2015-09-08 NOTE — Telephone Encounter (Signed)
Correction TDap and Hepatitis

## 2015-09-08 NOTE — Telephone Encounter (Signed)
Patient dropped off paperwork to be filled by docotor in regards to physical.  Patient is also needing a letter stating stating that she got her TB and Hepatitis shots completed here

## 2015-09-29 NOTE — Telephone Encounter (Signed)
Pt. Called in regards of her paperwork that she dropped off to be filled out by her PCP. Please f/u

## 2015-10-03 IMAGING — CT CT ABD-PELV W/ CM
3 of 4 series · 13 of 36 positions shown, 19 images · IV contrast (READICAT & [ID] OMNI 300)
Comparison: 11/09/2011

CLINICAL DATA: Abdominal pain, evaluate for recurrent hernia

EXAM:
CT ABDOMEN AND PELVIS WITH CONTRAST
TECHNIQUE: Multidetector CT imaging of the abdomen and pelvis was performed
using the standard protocol following bolus administration of
intravenous contrast.
CONTRAST:  125mL OMNIPAQUE IOHEXOL 300 MG/ML  SOLN

[Series 3: abd/pelvis with · axial · 0.98mm/px · z∈[-449,-99]mm · 7 of 94 slices shown, 12 images]
[im 12/94  soft-tissue]
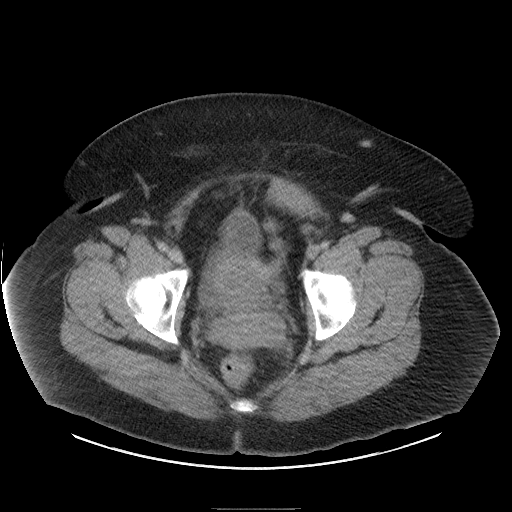
[im 12/94  bone]
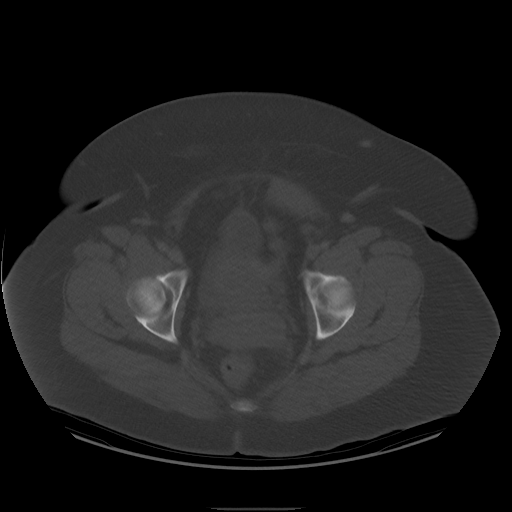
[im 24/94  soft-tissue]
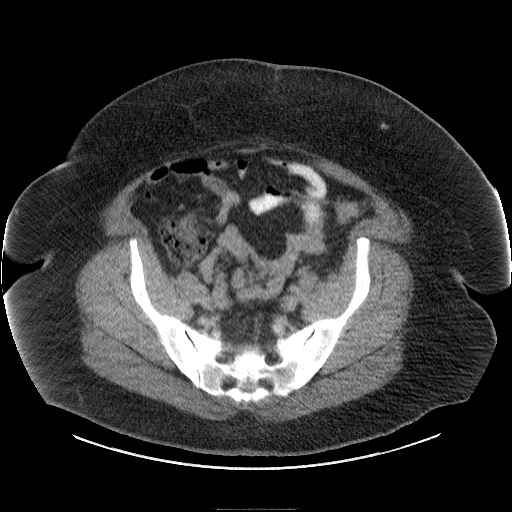
[im 35/94  soft-tissue]
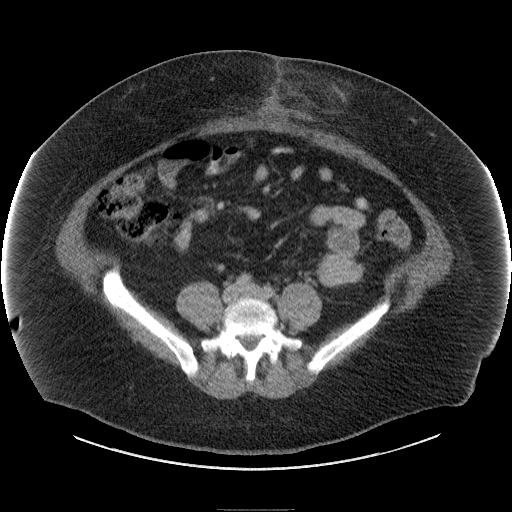
[im 47/94  soft-tissue]
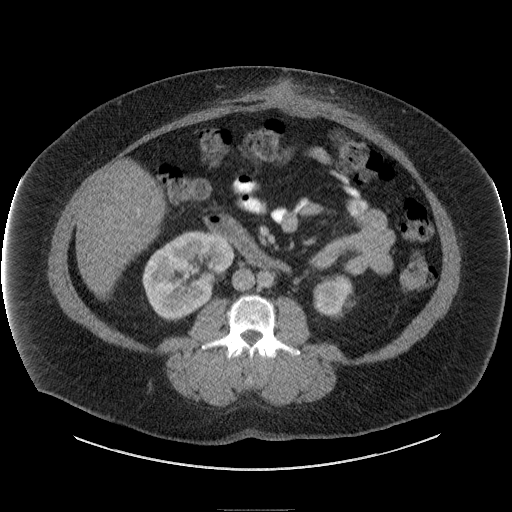
[im 47/94  lung]
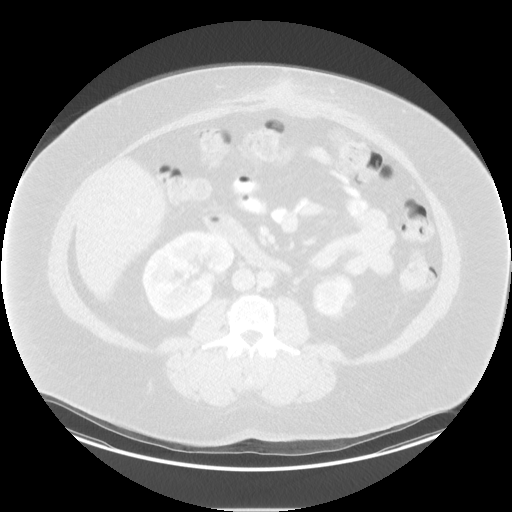
[im 59/94  soft-tissue]
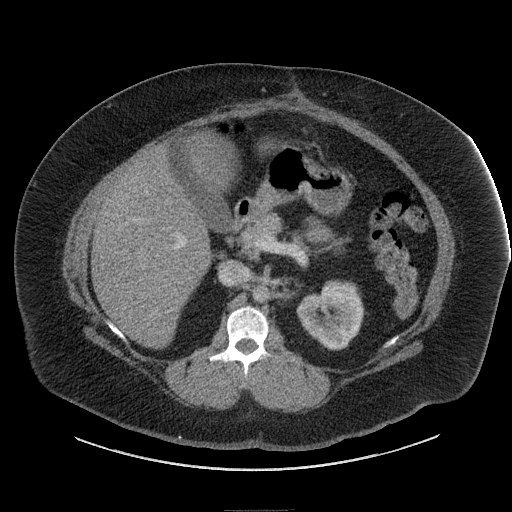
[im 59/94  lung]
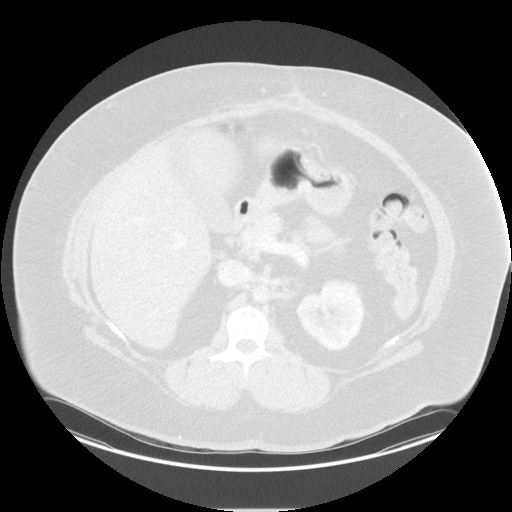
[im 70/94  soft-tissue]
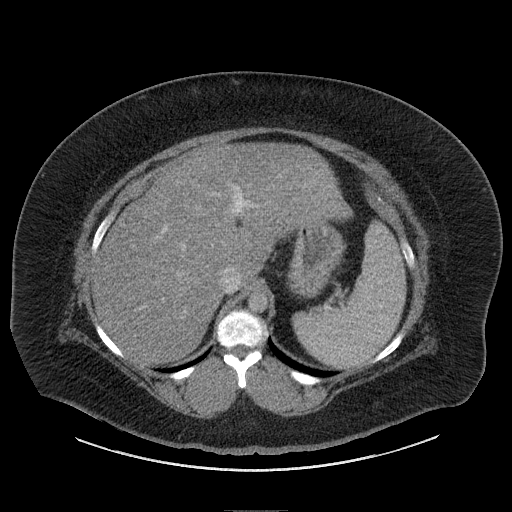
[im 70/94  lung]
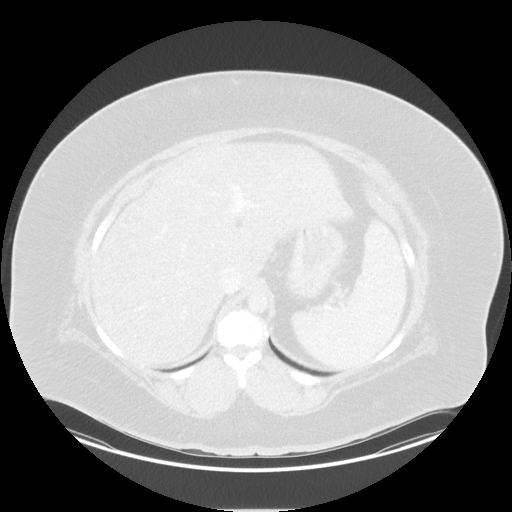
[im 82/94  soft-tissue]
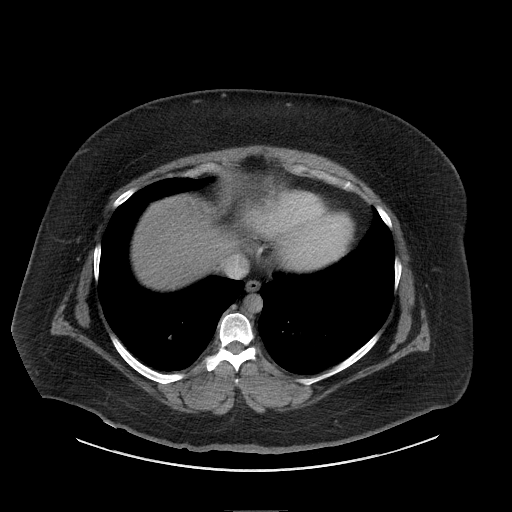
[im 82/94  lung]
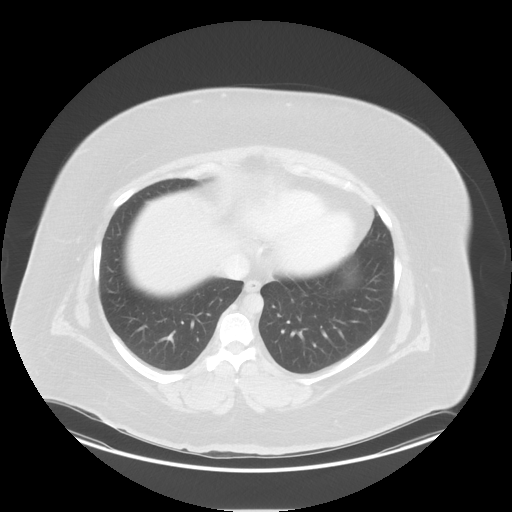

[Series 601: coronal body · coronal · 0.98mm/px · 1 of 162 slices shown, 2 images]
[im 54/162  soft-tissue]
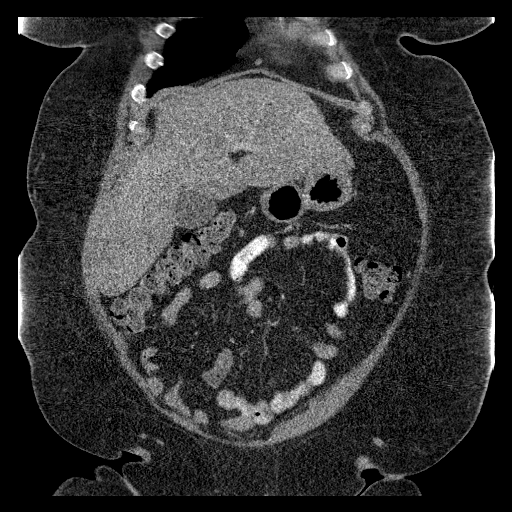
[im 54/162  bone]
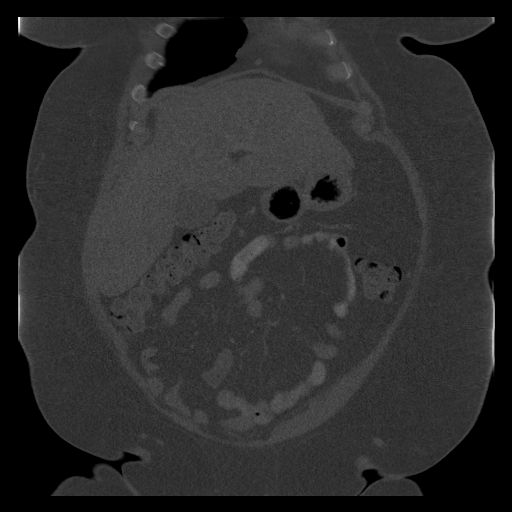

[Series 602: sagittal body · sagittal · 0.98mm/px · 5 of 201 slices shown]
[im 22/201  soft-tissue]
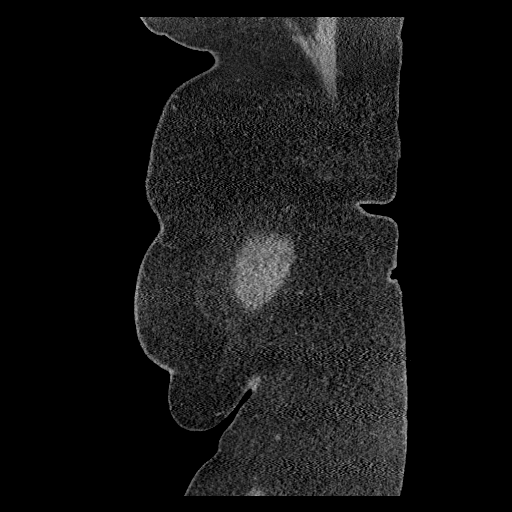
[im 43/201  soft-tissue]
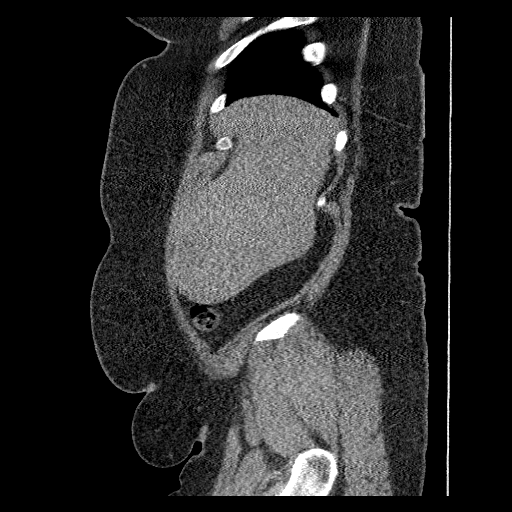
[im 64/201  soft-tissue]
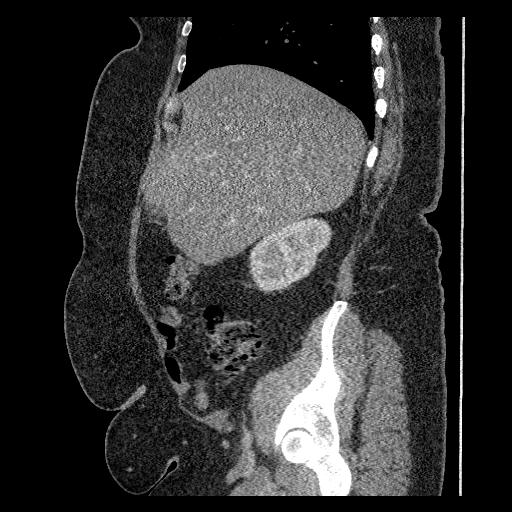
[im 85/201  soft-tissue]
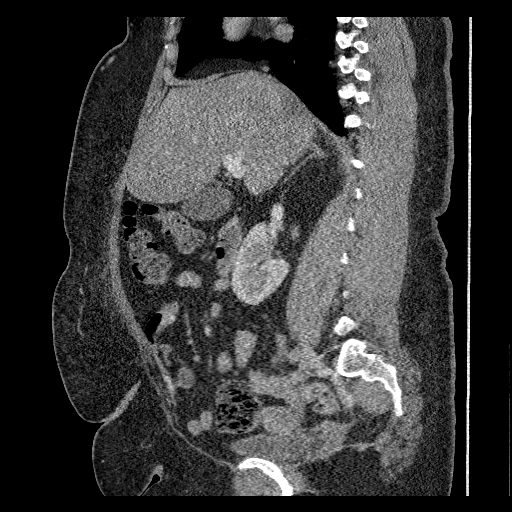
[im 116/201  soft-tissue]
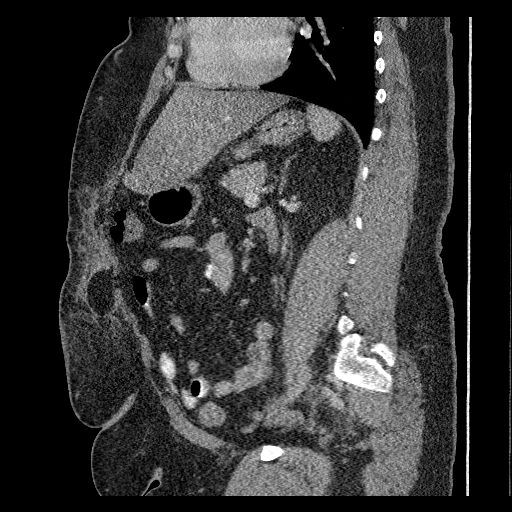

[13 of 36 positions shown; findings below may reference images not displayed]

FINDINGS: Lung bases are clear.

Suspected hepatic steatosis.

Spleen, pancreas, and adrenal glands are within normal limits.

Gallbladder is unremarkable. No intrahepatic or extrahepatic ductal
dilatation.

Kidneys are within normal limits.  No hydronephrosis.

No evidence of bowel obstruction.

No evidence of abdominal aortic aneurysm.

No abdominopelvic ascites.

No suspicious abdominopelvic lymphadenopathy.

Uterus and bilateral ovaries are unremarkable.

Bladder is within normal limits.

Small fat containing left paramidline ventral hernia (series 3/image
57). Multiple additional tiny fat containing ventral hernias (series
3/images 40, 53, 57, 61-62). No drainable fluid collection/abscess.

Mild degenerative changes of the visualized thoracolumbar spine.
IMPRESSION: Small fat containing left paramidline ventral hernia. Multiple
additional tiny fat containing ventral hernias.

No drainable fluid collections os abscess.

No evidence of bowel obstruction.

## 2015-10-04 NOTE — Telephone Encounter (Signed)
Patients needs appointment to have paperwork completed.

## 2015-10-06 ENCOUNTER — Encounter: Payer: Self-pay | Admitting: Internal Medicine

## 2015-10-07 ENCOUNTER — Encounter: Payer: Self-pay | Admitting: *Deleted

## 2015-10-10 NOTE — Telephone Encounter (Signed)
Patients paperwork has

## 2015-10-25 ENCOUNTER — Other Ambulatory Visit: Payer: Self-pay | Admitting: Internal Medicine

## 2015-10-25 DIAGNOSIS — E119 Type 2 diabetes mellitus without complications: Secondary | ICD-10-CM

## 2015-10-25 DIAGNOSIS — E039 Hypothyroidism, unspecified: Secondary | ICD-10-CM

## 2015-10-25 DIAGNOSIS — Z794 Long term (current) use of insulin: Principal | ICD-10-CM

## 2015-10-25 DIAGNOSIS — I10 Essential (primary) hypertension: Secondary | ICD-10-CM

## 2015-11-30 ENCOUNTER — Other Ambulatory Visit: Payer: Self-pay | Admitting: Internal Medicine

## 2015-11-30 DIAGNOSIS — E039 Hypothyroidism, unspecified: Secondary | ICD-10-CM

## 2016-02-25 ENCOUNTER — Other Ambulatory Visit: Payer: Self-pay | Admitting: Internal Medicine

## 2016-02-25 DIAGNOSIS — E039 Hypothyroidism, unspecified: Secondary | ICD-10-CM

## 2016-02-27 ENCOUNTER — Other Ambulatory Visit: Payer: Self-pay | Admitting: Internal Medicine

## 2016-02-27 DIAGNOSIS — A6000 Herpesviral infection of urogenital system, unspecified: Secondary | ICD-10-CM

## 2016-03-28 ENCOUNTER — Other Ambulatory Visit: Payer: Self-pay | Admitting: Internal Medicine

## 2016-03-28 DIAGNOSIS — E039 Hypothyroidism, unspecified: Secondary | ICD-10-CM

## 2016-04-15 ENCOUNTER — Other Ambulatory Visit: Payer: Self-pay | Admitting: Internal Medicine

## 2016-04-15 DIAGNOSIS — A6000 Herpesviral infection of urogenital system, unspecified: Secondary | ICD-10-CM

## 2016-05-06 ENCOUNTER — Other Ambulatory Visit: Payer: Self-pay | Admitting: Internal Medicine

## 2016-05-06 DIAGNOSIS — E039 Hypothyroidism, unspecified: Secondary | ICD-10-CM

## 2016-05-16 ENCOUNTER — Other Ambulatory Visit: Payer: Self-pay | Admitting: Internal Medicine

## 2016-05-16 DIAGNOSIS — A6000 Herpesviral infection of urogenital system, unspecified: Secondary | ICD-10-CM

## 2016-06-10 ENCOUNTER — Other Ambulatory Visit: Payer: Self-pay | Admitting: Internal Medicine

## 2016-06-10 DIAGNOSIS — E039 Hypothyroidism, unspecified: Secondary | ICD-10-CM

## 2016-06-19 ENCOUNTER — Other Ambulatory Visit: Payer: Self-pay | Admitting: Internal Medicine

## 2016-06-19 DIAGNOSIS — A6000 Herpesviral infection of urogenital system, unspecified: Secondary | ICD-10-CM

## 2016-08-03 ENCOUNTER — Other Ambulatory Visit: Payer: Self-pay | Admitting: Internal Medicine

## 2016-08-03 DIAGNOSIS — A6000 Herpesviral infection of urogenital system, unspecified: Secondary | ICD-10-CM
